# Patient Record
Sex: Female | Born: 1970
Health system: Southern US, Community
[De-identification: ages and names within clinical notes are randomized; demographics above are authoritative.]

## PROBLEM LIST (undated history)

## (undated) DIAGNOSIS — E079 Disorder of thyroid, unspecified: Secondary | ICD-10-CM

## (undated) DIAGNOSIS — R600 Localized edema: Secondary | ICD-10-CM

## (undated) DIAGNOSIS — M2550 Pain in unspecified joint: Secondary | ICD-10-CM

## (undated) DIAGNOSIS — K219 Gastro-esophageal reflux disease without esophagitis: Secondary | ICD-10-CM

## (undated) DIAGNOSIS — R7303 Prediabetes: Secondary | ICD-10-CM

## (undated) DIAGNOSIS — T7840XA Allergy, unspecified, initial encounter: Secondary | ICD-10-CM

## (undated) DIAGNOSIS — E785 Hyperlipidemia, unspecified: Secondary | ICD-10-CM

## (undated) DIAGNOSIS — N9489 Other specified conditions associated with female genital organs and menstrual cycle: Secondary | ICD-10-CM

## (undated) DIAGNOSIS — R112 Nausea with vomiting, unspecified: Secondary | ICD-10-CM

## (undated) DIAGNOSIS — L709 Acne, unspecified: Secondary | ICD-10-CM

## (undated) DIAGNOSIS — M7989 Other specified soft tissue disorders: Secondary | ICD-10-CM

## (undated) DIAGNOSIS — E7800 Pure hypercholesterolemia, unspecified: Secondary | ICD-10-CM

## (undated) DIAGNOSIS — N393 Stress incontinence (female) (male): Secondary | ICD-10-CM

## (undated) DIAGNOSIS — Z9889 Other specified postprocedural states: Secondary | ICD-10-CM

## (undated) DIAGNOSIS — E739 Lactose intolerance, unspecified: Secondary | ICD-10-CM

## (undated) DIAGNOSIS — E039 Hypothyroidism, unspecified: Secondary | ICD-10-CM

## (undated) HISTORY — DX: Disorder of thyroid, unspecified: E07.9

## (undated) HISTORY — DX: Hyperlipidemia, unspecified: E78.5

## (undated) HISTORY — DX: Acne, unspecified: L70.9

## (undated) HISTORY — DX: Localized edema: R60.0

## (undated) HISTORY — DX: Other specified soft tissue disorders: M79.89

## (undated) HISTORY — DX: Pure hypercholesterolemia, unspecified: E78.00

## (undated) HISTORY — DX: Gastro-esophageal reflux disease without esophagitis: K21.9

## (undated) HISTORY — DX: Pain in unspecified joint: M25.50

## (undated) HISTORY — PX: ABDOMINAL HYSTERECTOMY: SHX81

## (undated) HISTORY — DX: Allergy, unspecified, initial encounter: T78.40XA

## (undated) HISTORY — DX: Lactose intolerance, unspecified: E73.9

---
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
## 2006-08-11 DIAGNOSIS — D259 Leiomyoma of uterus, unspecified: Secondary | ICD-10-CM

## 2006-08-11 HISTORY — PX: LAPAROSCOPY: SHX197

## 2006-08-11 HISTORY — DX: Leiomyoma of uterus, unspecified: D25.9

## 2016-12-24 ENCOUNTER — Other Ambulatory Visit: Payer: Self-pay | Admitting: Obstetrics & Gynecology

## 2016-12-24 ENCOUNTER — Other Ambulatory Visit (HOSPITAL_COMMUNITY)
Admission: RE | Admit: 2016-12-24 | Discharge: 2016-12-24 | Disposition: A | Discharge status: Home / Self Care | Payer: 59 | Source: Ambulatory Visit | Attending: Obstetrics & Gynecology | Admitting: Obstetrics & Gynecology

## 2016-12-24 DIAGNOSIS — Z124 Encounter for screening for malignant neoplasm of cervix: Secondary | ICD-10-CM | POA: Insufficient documentation

## 2016-12-29 LAB — CYTOLOGY - PAP
DIAGNOSIS: NEGATIVE
HPV: NOT DETECTED

## 2017-03-31 DIAGNOSIS — M26629 Arthralgia of temporomandibular joint, unspecified side: Secondary | ICD-10-CM | POA: Insufficient documentation

## 2017-03-31 DIAGNOSIS — M26622 Arthralgia of left temporomandibular joint: Secondary | ICD-10-CM | POA: Diagnosis not present

## 2017-03-31 DIAGNOSIS — H9202 Otalgia, left ear: Secondary | ICD-10-CM | POA: Diagnosis not present

## 2017-04-20 DIAGNOSIS — M9901 Segmental and somatic dysfunction of cervical region: Secondary | ICD-10-CM | POA: Diagnosis not present

## 2017-04-20 DIAGNOSIS — M5032 Other cervical disc degeneration, mid-cervical region, unspecified level: Secondary | ICD-10-CM | POA: Diagnosis not present

## 2017-04-20 DIAGNOSIS — M531 Cervicobrachial syndrome: Secondary | ICD-10-CM | POA: Diagnosis not present

## 2017-04-22 DIAGNOSIS — M9901 Segmental and somatic dysfunction of cervical region: Secondary | ICD-10-CM | POA: Diagnosis not present

## 2017-04-22 DIAGNOSIS — M5032 Other cervical disc degeneration, mid-cervical region, unspecified level: Secondary | ICD-10-CM | POA: Diagnosis not present

## 2017-04-22 DIAGNOSIS — M531 Cervicobrachial syndrome: Secondary | ICD-10-CM | POA: Diagnosis not present

## 2017-04-23 DIAGNOSIS — M531 Cervicobrachial syndrome: Secondary | ICD-10-CM | POA: Diagnosis not present

## 2017-04-23 DIAGNOSIS — M5032 Other cervical disc degeneration, mid-cervical region, unspecified level: Secondary | ICD-10-CM | POA: Diagnosis not present

## 2017-04-23 DIAGNOSIS — M9901 Segmental and somatic dysfunction of cervical region: Secondary | ICD-10-CM | POA: Diagnosis not present

## 2017-04-24 DIAGNOSIS — M531 Cervicobrachial syndrome: Secondary | ICD-10-CM | POA: Diagnosis not present

## 2017-04-24 DIAGNOSIS — M5032 Other cervical disc degeneration, mid-cervical region, unspecified level: Secondary | ICD-10-CM | POA: Diagnosis not present

## 2017-04-24 DIAGNOSIS — M9901 Segmental and somatic dysfunction of cervical region: Secondary | ICD-10-CM | POA: Diagnosis not present

## 2017-04-29 DIAGNOSIS — M5032 Other cervical disc degeneration, mid-cervical region, unspecified level: Secondary | ICD-10-CM | POA: Diagnosis not present

## 2017-04-29 DIAGNOSIS — M9901 Segmental and somatic dysfunction of cervical region: Secondary | ICD-10-CM | POA: Diagnosis not present

## 2017-04-29 DIAGNOSIS — M531 Cervicobrachial syndrome: Secondary | ICD-10-CM | POA: Diagnosis not present

## 2017-05-01 DIAGNOSIS — M531 Cervicobrachial syndrome: Secondary | ICD-10-CM | POA: Diagnosis not present

## 2017-05-01 DIAGNOSIS — M9901 Segmental and somatic dysfunction of cervical region: Secondary | ICD-10-CM | POA: Diagnosis not present

## 2017-05-01 DIAGNOSIS — M5032 Other cervical disc degeneration, mid-cervical region, unspecified level: Secondary | ICD-10-CM | POA: Diagnosis not present

## 2017-05-04 DIAGNOSIS — M531 Cervicobrachial syndrome: Secondary | ICD-10-CM | POA: Diagnosis not present

## 2017-05-04 DIAGNOSIS — M5032 Other cervical disc degeneration, mid-cervical region, unspecified level: Secondary | ICD-10-CM | POA: Diagnosis not present

## 2017-05-04 DIAGNOSIS — M9901 Segmental and somatic dysfunction of cervical region: Secondary | ICD-10-CM | POA: Diagnosis not present

## 2017-05-07 DIAGNOSIS — M9901 Segmental and somatic dysfunction of cervical region: Secondary | ICD-10-CM | POA: Diagnosis not present

## 2017-05-07 DIAGNOSIS — M5032 Other cervical disc degeneration, mid-cervical region, unspecified level: Secondary | ICD-10-CM | POA: Diagnosis not present

## 2017-05-07 DIAGNOSIS — M531 Cervicobrachial syndrome: Secondary | ICD-10-CM | POA: Diagnosis not present

## 2017-05-13 DIAGNOSIS — M531 Cervicobrachial syndrome: Secondary | ICD-10-CM | POA: Diagnosis not present

## 2017-05-13 DIAGNOSIS — M9901 Segmental and somatic dysfunction of cervical region: Secondary | ICD-10-CM | POA: Diagnosis not present

## 2017-05-13 DIAGNOSIS — M5032 Other cervical disc degeneration, mid-cervical region, unspecified level: Secondary | ICD-10-CM | POA: Diagnosis not present

## 2017-05-14 DIAGNOSIS — M531 Cervicobrachial syndrome: Secondary | ICD-10-CM | POA: Diagnosis not present

## 2017-05-14 DIAGNOSIS — M5032 Other cervical disc degeneration, mid-cervical region, unspecified level: Secondary | ICD-10-CM | POA: Diagnosis not present

## 2017-05-14 DIAGNOSIS — M9901 Segmental and somatic dysfunction of cervical region: Secondary | ICD-10-CM | POA: Diagnosis not present

## 2017-09-09 DIAGNOSIS — N809 Endometriosis, unspecified: Secondary | ICD-10-CM | POA: Diagnosis not present

## 2017-09-09 DIAGNOSIS — N83202 Unspecified ovarian cyst, left side: Secondary | ICD-10-CM | POA: Diagnosis not present

## 2017-11-09 DIAGNOSIS — E039 Hypothyroidism, unspecified: Secondary | ICD-10-CM | POA: Diagnosis not present

## 2017-12-10 DIAGNOSIS — Z0000 Encounter for general adult medical examination without abnormal findings: Secondary | ICD-10-CM | POA: Diagnosis not present

## 2017-12-10 DIAGNOSIS — Z23 Encounter for immunization: Secondary | ICD-10-CM | POA: Diagnosis not present

## 2017-12-10 DIAGNOSIS — L700 Acne vulgaris: Secondary | ICD-10-CM | POA: Diagnosis not present

## 2017-12-10 DIAGNOSIS — E039 Hypothyroidism, unspecified: Secondary | ICD-10-CM | POA: Diagnosis not present

## 2017-12-25 ENCOUNTER — Other Ambulatory Visit: Payer: Self-pay | Admitting: Obstetrics & Gynecology

## 2017-12-25 DIAGNOSIS — N83202 Unspecified ovarian cyst, left side: Secondary | ICD-10-CM | POA: Diagnosis not present

## 2017-12-25 DIAGNOSIS — Z1231 Encounter for screening mammogram for malignant neoplasm of breast: Secondary | ICD-10-CM

## 2017-12-25 DIAGNOSIS — N941 Unspecified dyspareunia: Secondary | ICD-10-CM | POA: Diagnosis not present

## 2017-12-25 DIAGNOSIS — R300 Dysuria: Secondary | ICD-10-CM | POA: Diagnosis not present

## 2018-01-20 ENCOUNTER — Ambulatory Visit
Admission: RE | Admit: 2018-01-20 | Discharge: 2018-01-20 | Disposition: A | Discharge status: Home / Self Care | Payer: 59 | Source: Ambulatory Visit | Attending: Obstetrics & Gynecology | Admitting: Obstetrics & Gynecology

## 2018-01-20 DIAGNOSIS — Z1231 Encounter for screening mammogram for malignant neoplasm of breast: Secondary | ICD-10-CM

## 2018-03-10 DIAGNOSIS — R1031 Right lower quadrant pain: Secondary | ICD-10-CM | POA: Diagnosis not present

## 2018-03-10 DIAGNOSIS — R1033 Periumbilical pain: Secondary | ICD-10-CM | POA: Diagnosis not present

## 2018-03-16 ENCOUNTER — Other Ambulatory Visit: Payer: Self-pay | Admitting: Family Medicine

## 2018-03-16 DIAGNOSIS — R1033 Periumbilical pain: Secondary | ICD-10-CM

## 2018-03-16 DIAGNOSIS — R1031 Right lower quadrant pain: Secondary | ICD-10-CM

## 2018-03-24 ENCOUNTER — Ambulatory Visit
Admission: RE | Admit: 2018-03-24 | Discharge: 2018-03-24 | Disposition: A | Discharge status: Home / Self Care | Payer: 59 | Source: Ambulatory Visit | Attending: Family Medicine | Admitting: Family Medicine

## 2018-03-24 DIAGNOSIS — D252 Subserosal leiomyoma of uterus: Secondary | ICD-10-CM | POA: Diagnosis not present

## 2018-03-24 DIAGNOSIS — R1031 Right lower quadrant pain: Secondary | ICD-10-CM

## 2018-03-24 DIAGNOSIS — R1033 Periumbilical pain: Secondary | ICD-10-CM

## 2018-03-24 MED ORDER — IOPAMIDOL (ISOVUE-300) INJECTION 61%
100.0000 mL | Freq: Once | INTRAVENOUS | Status: AC | PRN
  Administered 2018-03-24: 100 mL via INTRAVENOUS

## 2018-05-08 DIAGNOSIS — M9901 Segmental and somatic dysfunction of cervical region: Secondary | ICD-10-CM | POA: Diagnosis not present

## 2018-05-08 DIAGNOSIS — M531 Cervicobrachial syndrome: Secondary | ICD-10-CM | POA: Diagnosis not present

## 2018-05-08 DIAGNOSIS — M5032 Other cervical disc degeneration, mid-cervical region, unspecified level: Secondary | ICD-10-CM | POA: Diagnosis not present

## 2018-05-10 DIAGNOSIS — M9901 Segmental and somatic dysfunction of cervical region: Secondary | ICD-10-CM | POA: Diagnosis not present

## 2018-05-10 DIAGNOSIS — M531 Cervicobrachial syndrome: Secondary | ICD-10-CM | POA: Diagnosis not present

## 2018-05-10 DIAGNOSIS — M9903 Segmental and somatic dysfunction of lumbar region: Secondary | ICD-10-CM | POA: Diagnosis not present

## 2018-05-13 DIAGNOSIS — M531 Cervicobrachial syndrome: Secondary | ICD-10-CM | POA: Diagnosis not present

## 2018-05-13 DIAGNOSIS — M9901 Segmental and somatic dysfunction of cervical region: Secondary | ICD-10-CM | POA: Diagnosis not present

## 2018-05-13 DIAGNOSIS — M9903 Segmental and somatic dysfunction of lumbar region: Secondary | ICD-10-CM | POA: Diagnosis not present

## 2018-05-19 DIAGNOSIS — M9901 Segmental and somatic dysfunction of cervical region: Secondary | ICD-10-CM | POA: Diagnosis not present

## 2018-05-19 DIAGNOSIS — M531 Cervicobrachial syndrome: Secondary | ICD-10-CM | POA: Diagnosis not present

## 2018-05-19 DIAGNOSIS — M9903 Segmental and somatic dysfunction of lumbar region: Secondary | ICD-10-CM | POA: Diagnosis not present

## 2018-05-21 DIAGNOSIS — Z111 Encounter for screening for respiratory tuberculosis: Secondary | ICD-10-CM | POA: Diagnosis not present

## 2020-03-02 ENCOUNTER — Other Ambulatory Visit: Payer: Self-pay | Admitting: Obstetrics & Gynecology

## 2020-03-02 DIAGNOSIS — Z1231 Encounter for screening mammogram for malignant neoplasm of breast: Secondary | ICD-10-CM

## 2020-03-14 ENCOUNTER — Other Ambulatory Visit: Payer: Self-pay

## 2020-03-14 ENCOUNTER — Ambulatory Visit
Admission: RE | Admit: 2020-03-14 | Discharge: 2020-03-14 | Disposition: A | Discharge status: Home / Self Care | Payer: 59 | Source: Ambulatory Visit | Attending: Obstetrics & Gynecology | Admitting: Obstetrics & Gynecology

## 2020-03-14 DIAGNOSIS — Z1231 Encounter for screening mammogram for malignant neoplasm of breast: Secondary | ICD-10-CM

## 2020-08-29 ENCOUNTER — Telehealth: Payer: Self-pay | Admitting: *Deleted

## 2020-08-29 NOTE — Telephone Encounter (Signed)
Called the patient for a new patient;  Scheduled for new patient for 1/21 at 3:00 pm for Dr Berline Lopes

## 2020-08-30 ENCOUNTER — Encounter: Payer: Self-pay | Admitting: Gynecologic Oncology

## 2020-08-31 ENCOUNTER — Encounter: Payer: Self-pay | Admitting: Gynecologic Oncology

## 2020-08-31 ENCOUNTER — Inpatient Hospital Stay: Payer: BC Managed Care – PPO | Attending: Gynecologic Oncology | Admitting: Gynecologic Oncology

## 2020-08-31 ENCOUNTER — Other Ambulatory Visit: Payer: Self-pay

## 2020-08-31 VITALS — BP 131/83 | HR 67 | Temp 96.8°F | Resp 18 | Ht 61.0 in | Wt 183.0 lb

## 2020-08-31 DIAGNOSIS — N83202 Unspecified ovarian cyst, left side: Secondary | ICD-10-CM | POA: Insufficient documentation

## 2020-08-31 DIAGNOSIS — R971 Elevated cancer antigen 125 [CA 125]: Secondary | ICD-10-CM | POA: Diagnosis not present

## 2020-08-31 DIAGNOSIS — E039 Hypothyroidism, unspecified: Secondary | ICD-10-CM | POA: Insufficient documentation

## 2020-08-31 DIAGNOSIS — E7800 Pure hypercholesterolemia, unspecified: Secondary | ICD-10-CM | POA: Diagnosis not present

## 2020-08-31 MED ORDER — SENNOSIDES-DOCUSATE SODIUM 8.6-50 MG PO TABS: 2.0000 | ORAL_TABLET | Freq: Every day | ORAL | 0 refills | Status: DC

## 2020-08-31 MED ORDER — IBUPROFEN 800 MG PO TABS: 800.0000 mg | ORAL_TABLET | Freq: Three times a day (TID) | ORAL | 0 refills | Status: DC | PRN

## 2020-08-31 MED ORDER — TRAMADOL HCL 50 MG PO TABS: 50.0000 mg | ORAL_TABLET | Freq: Four times a day (QID) | ORAL | 0 refills | Status: DC | PRN

## 2020-08-31 NOTE — H&P (View-Only) (Signed)
GYNECOLOGIC ONCOLOGY NEW PATIENT CONSULTATION   Patient Name: Betty Porter  Patient Age: 50 y.o. Date of Service: 08/31/20 Referring Provider: Dr. Christophe Louis  Primary Care Provider: Via, Lennette Bihari, MD Consulting Provider: Jeral Pinch, MD   Assessment/Plan:  Premenopausal patient with a mildly elevated Ca1 25 and complex adnexal mass concerning for endometrioma.  Discussed ultrasound findings with the patient with a mostly cystic adnexal mass.  In the setting of her history (laparoscopic surgery for infertility, dyspareunia) as well as description on ultrasound report of the cystic lesion, I am suspicious that it represents an endometrioma.  She denies being told previously that she had endometriosis.  We discussed her recent tumor markers.  Her HE4 and Roma were both within normal limits.  Her CA 125 was mildly elevated.  We discussed the limitations of this test and that it is not very sensitive or specific.  I reviewed that it can be elevated in many noncancerous processes, such as endometriosis and uterine fibroids.  Additionally, it can be normal and up to 30-50% of early stage ovarian cancer.  We discussed the limitations of imaging and tumor testing and that the only way to make a definitive diagnosis is with surgical excision.  My suspicion that this cyst represents a malignancy is quite low.  Given our current COVID surge, I discussed with the patient that we could move forward with planning for outpatient surgery with a unilateral oophorectomy and bilateral salpingectomy.  I discussed the risk that her surgery may need to be postponed given staffing shortages as well as hospital COVID numbers.  Tentatively, we will plan to move forward with surgery at the outpatient surgical center on February 2.  My recommendation was to proceed with scheduling robotic unilateral oophorectomy and bilateral salpingectomy.  Plan will be for excision of the enlarged ovary and cystic lesion.  I will then  place it in a bag and perform contained drainage.  We will then plan to send the adnexa to pathology for frozen section.  If no malignancy seen, then no further surgery is indicated.  In the event of a borderline tumor, I would recommend proceeding with completion surgery to involve contralateral salpingo-oophorectomy and total hysterectomy.  Only in the setting of malignancy, with the patient require staging including lymph node sampling, omentectomy, peritoneal biopsies, and any other indicated procedure.  She is amenable and would like to proceed with the least amount of surgery required.  We will plan for a robotic assisted unilateral salpingo-oophorectomy, contralateral salpingectomy, possible mini-lap, possible total hysterectomy, possible staging. The risks of surgery were discussed in detail and she understands these to include infection; wound separation; hernia; vaginal cuff separation, injury to adjacent organs such as bowel, bladder, blood vessels, ureters and nerves; bleeding which may require blood transfusion; anesthesia risk; thromboembolic events; possible death; unforeseen complications; possible need for re-exploration; medical complications such as heart attack, stroke, pleural effusion and pneumonia; and, if full lymphadenectomy is performed the risk of lymphedema and lymphocyst. The patient will receive DVT and antibiotic prophylaxis as indicated. She voiced a clear understanding. She had the opportunity to ask questions. Perioperative instructions were reviewed with her. Prescriptions for post-op medications were sent to her pharmacy of choice.  We briefly discussed her recent Pap test and recommendation for repeat cotesting in 3 years (given HPV negative).  A copy of this note was sent to the patient's referring provider.   65 minutes of total time was spent for this patient encounter, including preparation, face-to-face counseling with  the patient and coordination of care, and  documentation of the encounter.   Jeral Pinch, MD  Division of Gynecologic Oncology  Department of Obstetrics and Gynecology  [facility]  ___________________________________________  Chief Complaint: Chief Complaint  Patient presents with  . Cyst of left ovary    History of Present Illness:  Betty Porter is a 50 y.o. y.o. female who is seen in consultation at the request of Dr. Landry Mellow or an evaluation of a complex adnexal mass.  Patient has been followed for a complex left adnexal mass since approximately 2018.  At that time, she had an ultrasound showing a 5.5 x 5.1 x 4.9 cm complex left cystic mass with low-level internal echoes.  It was thought to be likely an endometrioma given its size and avascularity.  Repeat ultrasound in early 2019 showed the left complex cystic mass stable in size at 5.5 cm.  More recently, an ultrasound in September 2021 showed increase in size of the left adnexal mass to 9.4 cm, still avascular.  Most recent ultrasound on 1/5 showed the left ovary with a complex cyst measuring 10 x 7 x 6.9 cm, continues to be avascular.  All ultrasounds continue to show a fibroid uterus without significant growth.  Given recent growth in the mass, a CA125 was obtained and was elevated at 152.  HE4 was normal (51.9), and premenopausal Roma also normal (0.92).  Patient notes overall being asymptomatic.  This morning she had very minor "burning" in her left lower pelvis.  Otherwise she denies pain or cramping.  She endorses a good appetite without nausea or emesis.  She reports normal bowel function.  She is been seen for mixed urinary incontinence, denies any change to urinary function recently.  She continues to have regular monthly menses.  Her surgical history is notable for a C-section for arrest of labor.  She also had diagnostic laparoscopy for infertility work-up.  She is unsure whether there were findings of endometriosis.  She denies any  pain with bowel movements but notes a very long history of dyspareunia.  Patient lives in [place] with her husband and son.  She works as a Optometrist.  She has a remote history of tobacco use.  PAST MEDICAL HISTORY:  Past Medical History:  Diagnosis Date  . Fibroid, uterine 2008  . High cholesterol   . Thyroid disease    Hypothroidism     PAST SURGICAL HISTORY:  Past Surgical History:  Procedure Laterality Date  . CESAREAN SECTION  2009  . LAPAROSCOPY     for infertility  . TONSILLECTOMY  1977    OB/GYN HISTORY:  OB History  Gravida Para Term Preterm AB Living  1 1       1   SAB IAB Ectopic Multiple Live Births               # Outcome Date GA Lbr Len/2nd Weight Sex Delivery Anes PTL Lv  1 Para             No LMP recorded.  Age at menarche: 33  Age at menopause: n/a Hx of HRT: n/a Hx of STDs: no Last pap: 01/2017, negative, HPV negative; 02/2020 - had ASCUS pap, HPV negative History of abnormal pap smears: yes, most recent  SCREENING STUDIES:  Last mammogram: 03/2020  Last colonoscopy: n/a  MEDICATIONS: Outpatient Encounter Medications as of 08/31/2020  Medication Sig  . diclofenac Sodium (VOLTAREN) 1 % GEL Apply topically. Use as directed  .  gemfibrozil (LOPID) 600 MG tablet 1 tablet by mouth twice a day  . ibuprofen (ADVIL) 800 MG tablet Take 1 tablet (800 mg total) by mouth every 8 (eight) hours as needed for moderate pain. For AFTER surgery only  . levothyroxine (SYNTHROID) 75 MCG tablet 1 tablet on an empty stomach in the morning  . Multiple Vitamins-Minerals (WOMENS MULTI) CAPS Take 1 capsule by mouth daily.  Marland Kitchen senna-docusate (SENOKOT-S) 8.6-50 MG tablet Take 2 tablets by mouth at bedtime. For AFTER surgery, do not take if having diarrhea  . traMADol (ULTRAM) 50 MG tablet Take 1 tablet (50 mg total) by mouth every 6 (six) hours as needed for severe pain. For AFTER surgery only, do not take and drive   No facility-administered encounter medications  on file as of 08/31/2020.    ALLERGIES:  No Known Allergies   FAMILY HISTORY:  Family History  Problem Relation Age of Onset  . Breast cancer Maternal Aunt   . Hypertension Mother   . Osteoporosis Mother   . Arthritis Mother   . Diabetes Father   . Thyroid cancer Maternal Aunt   . Leukemia Maternal Uncle   . Colon cancer Neg Hx   . Ovarian cancer Neg Hx   . Endometrial cancer Neg Hx   . Pancreatic cancer Neg Hx   . Prostate cancer Neg Hx      SOCIAL HISTORY:  Social Connections: Not on file    REVIEW OF SYSTEMS:  + joint pain Denies appetite changes, fevers, chills, fatigue, unexplained weight changes. Denies hearing loss, neck lumps or masses, mouth sores, ringing in ears or voice changes. Denies cough or wheezing.  Denies shortness of breath. Denies chest pain or palpitations. Denies leg swelling. Denies abdominal distention, pain, blood in stools, constipation, diarrhea, nausea, vomiting, or early satiety. Denies pain with intercourse, dysuria, frequency, hematuria or incontinence. Denies hot flashes, pelvic pain, vaginal bleeding or vaginal discharge.   Denies back pain or muscle pain/cramps. Denies itching, rash, or wounds. Denies dizziness, headaches, numbness or seizures. Denies swollen lymph nodes or glands, denies easy bruising or bleeding. Denies anxiety, depression, confusion, or decreased concentration.  Physical Exam:  Vital Signs for this encounter:  Blood pressure 131/83, pulse 67, temperature (!) 96.8 F (36 C), temperature source Tympanic, resp. rate 18, height 5\' 1"  (1.549 m), weight 183 lb (83 kg), SpO2 100 %. Body mass index is 34.58 kg/m. General: Alert, oriented, no acute distress.  HEENT: Normocephalic, atraumatic. Sclera anicteric.  Chest: Clear to auscultation bilaterally. No wheezes, rhonchi, or rales. Cardiovascular: Regular rate and rhythm, no murmurs, rubs, or gallops.  Abdomen: Obese. Normoactive bowel sounds. Soft, nondistended,  nontender to palpation. No masses or hepatosplenomegaly appreciated. No palpable fluid wave.  Extremities: Grossly normal range of motion. Warm, well perfused. No edema bilaterally.  Skin: No rashes or lesions.  Lymphatics: No cervical, supraclavicular, or inguinal adenopathy.  GU:  Normal external female genitalia.   No lesions. No discharge or bleeding.             Bladder/urethra:  No lesions or masses, well supported bladder             Vagina: well rugated, no lesions.             Cervix: Normal appearing, no lesions.             Uterus: 8-10cm, mobile, no parametrial involvement or nodularity.              Adnexa: Midline smooth mass  that moves in junction with uterus, sitting out of the pelvis.  Rectal: Deferred.  LABORATORY AND RADIOLOGIC DATA:  Outside medical records were reviewed to synthesize the above history, along with the history and physical obtained during the visit.   No results found for: WBC, HGB, HCT, PLT, GLUCOSE, CHOL, TRIG, HDL, LDLDIRECT, LDLCALC, ALT, AST, NA, K, CL, CREATININE, BUN, CO2, TSH, PSA, INR, GLUF, HGBA1C, MICROALBUR   Most recent pelvic ultrasound from [facility] on 08/15/2020: Uterus measures 10.25 x 6.2 x 7.2 cm.  3 fibroids noted the largest of which measured 4 cm.  Right ovary normal in appearance.  Left ovary with a complex cyst with low-level internal echoes measuring 10 x 7 x 6.9 cm, avascular.  CT A/P 03/2018: 1. No acute findings within the abdomen or pelvis. No evidence for hernia. 2. Left ovary cysts. The largest measures 5.8 cm. This is almost certainly benign, but follow up ultrasound is recommended in 1 year according to the Society of Radiologists in [place] Statement (D Clovis Riley et al. Management of Asymptomatic Ovarian and Other Adnexal Cysts Imaged at [place]: Society of Radiologists in [place] Statement 2010. Radiology 256 (Sept 2010): 943-954.). 3. 4.7 cm calcified subserosal fibroid  arises from the left lateral wall of the uterus.

## 2020-08-31 NOTE — Progress Notes (Signed)
GYNECOLOGIC ONCOLOGY NEW PATIENT CONSULTATION   Patient Name: Betty Porter  Patient Age: 50 y.o. Date of Service: 08/31/20 Referring Provider: Dr. Christophe Louis  Primary Care Provider: Via, Lennette Bihari, MD Consulting Provider: Jeral Pinch, MD   Assessment/Plan:  Premenopausal patient with a mildly elevated Ca1 25 and complex adnexal mass concerning for endometrioma.  Discussed ultrasound findings with the patient with a mostly cystic adnexal mass.  In the setting of her history (laparoscopic surgery for infertility, dyspareunia) as well as description on ultrasound report of the cystic lesion, I am suspicious that it represents an endometrioma.  She denies being told previously that she had endometriosis.  We discussed her recent tumor markers.  Her HE4 and Roma were both within normal limits.  Her CA 125 was mildly elevated.  We discussed the limitations of this test and that it is not very sensitive or specific.  I reviewed that it can be elevated in many noncancerous processes, such as endometriosis and uterine fibroids.  Additionally, it can be normal and up to 30-50% of early stage ovarian cancer.  We discussed the limitations of imaging and tumor testing and that the only way to make a definitive diagnosis is with surgical excision.  My suspicion that this cyst represents a malignancy is quite low.  Given our current COVID surge, I discussed with the patient that we could move forward with planning for outpatient surgery with a unilateral oophorectomy and bilateral salpingectomy.  I discussed the risk that her surgery may need to be postponed given staffing shortages as well as hospital COVID numbers.  Tentatively, we will plan to move forward with surgery at the outpatient surgical center on February 2.  My recommendation was to proceed with scheduling robotic unilateral oophorectomy and bilateral salpingectomy.  Plan will be for excision of the enlarged ovary and cystic lesion.  I will then  place it in a bag and perform contained drainage.  We will then plan to send the adnexa to pathology for frozen section.  If no malignancy seen, then no further surgery is indicated.  In the event of a borderline tumor, I would recommend proceeding with completion surgery to involve contralateral salpingo-oophorectomy and total hysterectomy.  Only in the setting of malignancy, with the patient require staging including lymph node sampling, omentectomy, peritoneal biopsies, and any other indicated procedure.  She is amenable and would like to proceed with the least amount of surgery required.  We will plan for a robotic assisted unilateral salpingo-oophorectomy, contralateral salpingectomy, possible mini-lap, possible total hysterectomy, possible staging. The risks of surgery were discussed in detail and she understands these to include infection; wound separation; hernia; vaginal cuff separation, injury to adjacent organs such as bowel, bladder, blood vessels, ureters and nerves; bleeding which may require blood transfusion; anesthesia risk; thromboembolic events; possible death; unforeseen complications; possible need for re-exploration; medical complications such as heart attack, stroke, pleural effusion and pneumonia; and, if full lymphadenectomy is performed the risk of lymphedema and lymphocyst. The patient will receive DVT and antibiotic prophylaxis as indicated. She voiced a clear understanding. She had the opportunity to ask questions. Perioperative instructions were reviewed with her. Prescriptions for post-op medications were sent to her pharmacy of choice.  We briefly discussed her recent Pap test and recommendation for repeat cotesting in 3 years (given HPV negative).  A copy of this note was sent to the patient's referring provider.   48 minutes of total time was spent for this patient encounter, including preparation, face-to-face counseling with  the patient and coordination of care, and  documentation of the encounter.   Jeral Pinch, MD  Division of Gynecologic Oncology  Department of Obstetrics and Gynecology  [facility]  ___________________________________________  Chief Complaint: Chief Complaint  Patient presents with  . Cyst of left ovary    History of Present Illness:  Betty Porter is a 50 y.o. y.o. female who is seen in consultation at the request of Dr. Landry Mellow or an evaluation of a complex adnexal mass.  Patient has been followed for a complex left adnexal mass since approximately 2018.  At that time, she had an ultrasound showing a 5.5 x 5.1 x 4.9 cm complex left cystic mass with low-level internal echoes.  It was thought to be likely an endometrioma given its size and avascularity.  Repeat ultrasound in early 2019 showed the left complex cystic mass stable in size at 5.5 cm.  More recently, an ultrasound in September 2021 showed increase in size of the left adnexal mass to 9.4 cm, still avascular.  Most recent ultrasound on 1/5 showed the left ovary with a complex cyst measuring 10 x 7 x 6.9 cm, continues to be avascular.  All ultrasounds continue to show a fibroid uterus without significant growth.  Given recent growth in the mass, a CA125 was obtained and was elevated at 152.  HE4 was normal (51.9), and premenopausal Roma also normal (0.92).  Patient notes overall being asymptomatic.  This morning she had very minor "burning" in her left lower pelvis.  Otherwise she denies pain or cramping.  She endorses a good appetite without nausea or emesis.  She reports normal bowel function.  She is been seen for mixed urinary incontinence, denies any change to urinary function recently.  She continues to have regular monthly menses.  Her surgical history is notable for a C-section for arrest of labor.  She also had diagnostic laparoscopy for infertility work-up.  She is unsure whether there were findings of endometriosis.  She denies any  pain with bowel movements but notes a very long history of dyspareunia.  Patient lives in [place] with her husband and son.  She works as a Optometrist.  She has a remote history of tobacco use.  PAST MEDICAL HISTORY:  Past Medical History:  Diagnosis Date  . Fibroid, uterine 2008  . High cholesterol   . Thyroid disease    Hypothroidism     PAST SURGICAL HISTORY:  Past Surgical History:  Procedure Laterality Date  . CESAREAN SECTION  2009  . LAPAROSCOPY     for infertility  . TONSILLECTOMY  1977    OB/GYN HISTORY:  OB History  Gravida Para Term Preterm AB Living  1 1       1   SAB IAB Ectopic Multiple Live Births               # Outcome Date GA Lbr Len/2nd Weight Sex Delivery Anes PTL Lv  1 Para             No LMP recorded.  Age at menarche: 66  Age at menopause: n/a Hx of HRT: n/a Hx of STDs: no Last pap: 01/2017, negative, HPV negative; 02/2020 - had ASCUS pap, HPV negative History of abnormal pap smears: yes, most recent  SCREENING STUDIES:  Last mammogram: 03/2020  Last colonoscopy: n/a  MEDICATIONS: Outpatient Encounter Medications as of 08/31/2020  Medication Sig  . diclofenac Sodium (VOLTAREN) 1 % GEL Apply topically. Use as directed  .  gemfibrozil (LOPID) 600 MG tablet 1 tablet by mouth twice a day  . ibuprofen (ADVIL) 800 MG tablet Take 1 tablet (800 mg total) by mouth every 8 (eight) hours as needed for moderate pain. For AFTER surgery only  . levothyroxine (SYNTHROID) 75 MCG tablet 1 tablet on an empty stomach in the morning  . Multiple Vitamins-Minerals (WOMENS MULTI) CAPS Take 1 capsule by mouth daily.  Marland Kitchen senna-docusate (SENOKOT-S) 8.6-50 MG tablet Take 2 tablets by mouth at bedtime. For AFTER surgery, do not take if having diarrhea  . traMADol (ULTRAM) 50 MG tablet Take 1 tablet (50 mg total) by mouth every 6 (six) hours as needed for severe pain. For AFTER surgery only, do not take and drive   No facility-administered encounter medications  on file as of 08/31/2020.    ALLERGIES:  No Known Allergies   FAMILY HISTORY:  Family History  Problem Relation Age of Onset  . Breast cancer Maternal Aunt   . Hypertension Mother   . Osteoporosis Mother   . Arthritis Mother   . Diabetes Father   . Thyroid cancer Maternal Aunt   . Leukemia Maternal Uncle   . Colon cancer Neg Hx   . Ovarian cancer Neg Hx   . Endometrial cancer Neg Hx   . Pancreatic cancer Neg Hx   . Prostate cancer Neg Hx      SOCIAL HISTORY:  Social Connections: Not on file    REVIEW OF SYSTEMS:  + joint pain Denies appetite changes, fevers, chills, fatigue, unexplained weight changes. Denies hearing loss, neck lumps or masses, mouth sores, ringing in ears or voice changes. Denies cough or wheezing.  Denies shortness of breath. Denies chest pain or palpitations. Denies leg swelling. Denies abdominal distention, pain, blood in stools, constipation, diarrhea, nausea, vomiting, or early satiety. Denies pain with intercourse, dysuria, frequency, hematuria or incontinence. Denies hot flashes, pelvic pain, vaginal bleeding or vaginal discharge.   Denies back pain or muscle pain/cramps. Denies itching, rash, or wounds. Denies dizziness, headaches, numbness or seizures. Denies swollen lymph nodes or glands, denies easy bruising or bleeding. Denies anxiety, depression, confusion, or decreased concentration.  Physical Exam:  Vital Signs for this encounter:  Blood pressure 131/83, pulse 67, temperature (!) 96.8 F (36 C), temperature source Tympanic, resp. rate 18, height 5\' 1"  (1.549 m), weight 183 lb (83 kg), SpO2 100 %. Body mass index is 34.58 kg/m. General: Alert, oriented, no acute distress.  HEENT: Normocephalic, atraumatic. Sclera anicteric.  Chest: Clear to auscultation bilaterally. No wheezes, rhonchi, or rales. Cardiovascular: Regular rate and rhythm, no murmurs, rubs, or gallops.  Abdomen: Obese. Normoactive bowel sounds. Soft, nondistended,  nontender to palpation. No masses or hepatosplenomegaly appreciated. No palpable fluid wave.  Extremities: Grossly normal range of motion. Warm, well perfused. No edema bilaterally.  Skin: No rashes or lesions.  Lymphatics: No cervical, supraclavicular, or inguinal adenopathy.  GU:  Normal external female genitalia.   No lesions. No discharge or bleeding.             Bladder/urethra:  No lesions or masses, well supported bladder             Vagina: well rugated, no lesions.             Cervix: Normal appearing, no lesions.             Uterus: 8-10cm, mobile, no parametrial involvement or nodularity.              Adnexa: Midline smooth mass  that moves in junction with uterus, sitting out of the pelvis.  Rectal: Deferred.  LABORATORY AND RADIOLOGIC DATA:  Outside medical records were reviewed to synthesize the above history, along with the history and physical obtained during the visit.   No results found for: WBC, HGB, HCT, PLT, GLUCOSE, CHOL, TRIG, HDL, LDLDIRECT, LDLCALC, ALT, AST, NA, K, CL, CREATININE, BUN, CO2, TSH, PSA, INR, GLUF, HGBA1C, MICROALBUR   Most recent pelvic ultrasound from [facility] on 08/15/2020: Uterus measures 10.25 x 6.2 x 7.2 cm.  3 fibroids noted the largest of which measured 4 cm.  Right ovary normal in appearance.  Left ovary with a complex cyst with low-level internal echoes measuring 10 x 7 x 6.9 cm, avascular.  CT A/P 03/2018: 1. No acute findings within the abdomen or pelvis. No evidence for hernia. 2. Left ovary cysts. The largest measures 5.8 cm. This is almost certainly benign, but follow up ultrasound is recommended in 1 year according to the Society of Radiologists in [place] Statement (D Clovis Riley et al. Management of Asymptomatic Ovarian and Other Adnexal Cysts Imaged at [place]: Society of Radiologists in [place] Statement 2010. Radiology 256 (Sept 2010): 943-954.). 3. 4.7 cm calcified subserosal fibroid  arises from the left lateral wall of the uterus.

## 2020-08-31 NOTE — Patient Instructions (Addendum)
Preparing for your Surgery  Plan for surgery on September 12, 2020 with Dr. Jeral Pinch at [facility]. You will be scheduled for a robotic assisted laparoscopic unilateral oophorectomy (removal of one ovary), bilateral salpingectomy (removal of both fallopian tubes), possible staging if a precancer or cancer is identified including removal of the other ovary, hysterectomy (removal of the uterus and cervix), possible mini laparotomy (larger incision on your abdomen if needed).   Pre-operative Testing -You will receive a phone call from presurgical testing at [facility] to arrange for a pre-operative appointment, labs if needed, and COVID test. The COVID test normally happens 3 days prior to the surgery and they ask that you self quarantine after the test up until surgery to decrease chance of exposure.  -Bring your insurance card, copy of an advanced directive if applicable, medication list  -At that visit, you will be asked to sign a consent for a possible blood transfusion in case a transfusion becomes necessary during surgery.  The need for a blood transfusion is rare but having consent is a necessary part of your care.     -You should not be taking blood thinners or aspirin at least ten days prior to surgery unless instructed by your surgeon.  -Do not take supplements such as fish oil (omega 3), red yeast rice, turmeric before your surgery.   Day Before Surgery at [place] will be asked to take in a light diet the day before surgery. You will be advised you can have clear liquids up until 3 hours before your surgery.    Eat a light diet the day before surgery.  Examples including soups, broths, toast, yogurt, mashed potatoes.  AVOID GAS PRODUCING FOODS. Things to avoid include carbonated beverages (fizzy beverages), raw fruits and raw vegetables, or beans.   If your bowels are filled with gas, your surgeon will have difficulty visualizing your pelvic organs  which increases your surgical risks.  Your role in recovery Your role is to become active as soon as directed by your doctor, while still giving yourself time to heal.  Rest when you feel tired. You will be asked to do the following in order to speed your recovery:  - Cough and breathe deeply. This helps to clear and expand your lungs and can prevent pneumonia after surgery.  - [place]. Do mild physical activity. Walking or moving your legs help your circulation and body functions return to normal. Do not try to get up or walk alone the first time after surgery.   -If you develop swelling on one leg or the other, pain in the back of your leg, redness/warmth in one of your legs, please call the office or go to the Emergency Room to have a doppler to rule out a blood clot. For shortness of breath, chest pain-seek care in the Emergency Room as soon as possible. - Actively manage your pain. Managing your pain lets you move in comfort. We will ask you to rate your pain on a scale of zero to 10. It is your responsibility to tell your doctor or nurse where and how much you hurt so your pain can be treated.  Special Considerations -If you are diabetic, you may be placed on insulin after surgery to have closer control over your blood sugars to promote healing and recovery.  This does not mean that you will be discharged on insulin.  If applicable, your oral antidiabetics will be resumed  when you are tolerating a solid diet.  -Your final pathology results from surgery should be available around one week after surgery and the results will be relayed to you when available.  -FMLA forms can be faxed to [phone] and please allow 5-7 business days for completion.  Pain Management After Surgery -You have been prescribed your pain medication and bowel regimen medications before surgery so that you can have these available when you are discharged from the hospital. The pain medication is  for use ONLY AFTER surgery and a new prescription will not be given.   -Make sure that you have Tylenol and Ibuprofen at home to use on a regular basis after surgery for pain control. We recommend alternating the medications every hour to six hours since they work differently and are processed in the body differently for pain relief.  -Review the attached handout on narcotic use and their risks and side effects.   Bowel Regimen -You have been prescribed Sennakot-S to take nightly to prevent constipation especially if you are taking the narcotic pain medication intermittently.  It is important to prevent constipation and drink adequate amounts of liquids. You can stop taking this medication when you are not taking pain medication and you are back on your normal bowel routine.  Risks of Surgery Risks of surgery are low but include bleeding, infection, damage to surrounding structures, re-operation, blood clots, and very rarely death.   Blood Transfusion Information (For the consent to be signed before surgery)  We will be checking your blood type before surgery so in case of emergencies, we will know what type of blood you would need.                                            WHAT IS A BLOOD TRANSFUSION?  A transfusion is the replacement of blood or some of its parts. Blood is made up of multiple cells which provide different functions.  Red blood cells carry oxygen and are used for blood loss replacement.  White blood cells fight against infection.  Platelets control bleeding.  Plasma helps clot blood.  Other blood products are available for specialized needs, such as hemophilia or other clotting disorders. BEFORE THE TRANSFUSION  Who gives blood for transfusions?   You may be able to donate blood to be used at a later date on yourself (autologous donation).  Relatives can be asked to donate blood. This is generally not any safer than if you have received blood from a stranger. The  same precautions are taken to ensure safety when a relative's blood is donated.  Healthy volunteers who are fully evaluated to make sure their blood is safe. This is blood bank blood. Transfusion therapy is the safest it has ever been in the practice of medicine. Before blood is taken from a donor, a complete history is taken to make sure that person has no history of diseases nor engages in risky social behavior (examples are intravenous drug use or sexual activity with multiple partners). The donor's travel history is screened to minimize risk of transmitting infections, such as malaria. The donated blood is tested for signs of infectious diseases, such as HIV and hepatitis. The blood is then tested to be sure it is compatible with you in order to minimize the chance of a transfusion reaction. If you or a relative donates blood, this is often  done in anticipation of surgery and is not appropriate for emergency situations. It takes many days to process the donated blood. RISKS AND COMPLICATIONS Although transfusion therapy is very safe and saves many lives, the main dangers of transfusion include:   Getting an infectious disease.  Developing a transfusion reaction. This is an allergic reaction to something in the blood you were given. Every precaution is taken to prevent this. The decision to have a blood transfusion has been considered carefully by your caregiver before blood is given. Blood is not given unless the benefits outweigh the risks.  AFTER SURGERY INSTRUCTIONS  Return to work: 4 weeks if applicable  Activity: 1. Be up and out of the bed during the day.  Take a nap if needed.  You may walk up steps but be careful and use the hand rail.  Stair climbing will tire you more than you think, you may need to stop part way and rest.   2. No lifting or straining for 6 weeks over 10 pounds. No pushing, pulling, straining for 6 weeks.  3. No driving for 1 week(s).  Do not drive if you are  taking narcotic pain medicine and make sure that your reaction time has returned.   4. You can shower as soon as the next day after surgery. Shower daily.  Use your regular soap and water (not directly on the incision) and pat your incision(s) dry afterwards; don't rub.  No tub baths or submerging your body in water until cleared by your surgeon. If you have the soap that was given to you by pre-surgical testing that was used before surgery, you do not need to use it afterwards because this can irritate your incisions.   5. No sexual activity and nothing in the vagina for 4 weeks (8 weeks if you have a hysterectomy).  6. You may experience a small amount of clear drainage from your incisions, which is normal.  If the drainage persists, increases, or changes color please call the office.  7. Do not use creams, lotions, or ointments such as neosporin on your incisions after surgery until advised by your surgeon because they can cause removal of the dermabond glue on your incisions.    8. You may experience vaginal spotting after surgery or around the 6-8 week mark from surgery when the stitches at the top of the vagina begin to dissolve (if you have a hysterectomy).  The spotting is normal but if you experience heavy bleeding, call our office.  9. Take Tylenol or ibuprofen first for pain and only use narcotic pain medication for severe pain not relieved by the Tylenol or Ibuprofen.  Monitor your Tylenol intake to a max of 4,000 mg in a 24 hour period. You can alternate these medications after surgery.  Diet: 1. Low sodium Heart Healthy Diet is recommended but you are cleared to resume your normal (before surgery) diet after your procedure.  2. It is safe to use a laxative, such as Miralax or Colace, if you have difficulty moving your bowels. You have been prescribed Sennakot at bedtime every evening to keep bowel movements regular and to prevent constipation.    Wound Care: 1. Keep clean and dry.   Shower daily.  Reasons to call the Doctor:  Fever - Oral temperature greater than 100.4 degrees Fahrenheit  Foul-smelling vaginal discharge  Difficulty urinating  Nausea and vomiting  Increased pain at the site of the incision that is unrelieved with pain medicine.  Difficulty breathing with  or without chest pain  New calf pain especially if only on one side  Sudden, continuing increased vaginal bleeding with or without clots.   Contacts: For questions or concerns you should contact:  Dr. Jeral Pinch at [phone]  Joylene John, NP at [phone]  After Hours: call [phone] and have the GYN Oncologist paged/contacted (after 5 pm or on the weekends).  Messages sent via mychart are for non-urgent matters and are not responded to after hours so for urgent needs, please call the after hours number.

## 2020-09-03 ENCOUNTER — Other Ambulatory Visit: Payer: Self-pay | Admitting: Gynecologic Oncology

## 2020-09-03 DIAGNOSIS — N83202 Unspecified ovarian cyst, left side: Secondary | ICD-10-CM

## 2020-09-08 ENCOUNTER — Other Ambulatory Visit (HOSPITAL_COMMUNITY): Payer: BC Managed Care – PPO

## 2020-09-10 ENCOUNTER — Encounter (HOSPITAL_COMMUNITY)
Admission: RE | Admit: 2020-09-10 | Discharge: 2020-09-10 | Disposition: A | Discharge status: Home / Self Care | Payer: BC Managed Care – PPO | Source: Ambulatory Visit | Attending: Gynecologic Oncology | Admitting: Gynecologic Oncology

## 2020-09-10 ENCOUNTER — Encounter (HOSPITAL_BASED_OUTPATIENT_CLINIC_OR_DEPARTMENT_OTHER): Payer: Self-pay | Admitting: Gynecologic Oncology

## 2020-09-10 ENCOUNTER — Other Ambulatory Visit: Payer: Self-pay

## 2020-09-10 ENCOUNTER — Other Ambulatory Visit (HOSPITAL_COMMUNITY)
Admission: RE | Admit: 2020-09-10 | Discharge: 2020-09-10 | Disposition: A | Discharge status: Home / Self Care | Payer: BC Managed Care – PPO | Source: Ambulatory Visit | Attending: Gynecologic Oncology | Admitting: Gynecologic Oncology

## 2020-09-10 DIAGNOSIS — Z20822 Contact with and (suspected) exposure to covid-19: Secondary | ICD-10-CM | POA: Insufficient documentation

## 2020-09-10 DIAGNOSIS — R971 Elevated cancer antigen 125 [CA 125]: Secondary | ICD-10-CM | POA: Diagnosis not present

## 2020-09-10 DIAGNOSIS — Z01812 Encounter for preprocedural laboratory examination: Secondary | ICD-10-CM | POA: Insufficient documentation

## 2020-09-10 DIAGNOSIS — N801 Endometriosis of ovary: Secondary | ICD-10-CM | POA: Diagnosis not present

## 2020-09-10 DIAGNOSIS — N859 Noninflammatory disorder of uterus, unspecified: Secondary | ICD-10-CM | POA: Diagnosis present

## 2020-09-10 DIAGNOSIS — Z79890 Hormone replacement therapy (postmenopausal): Secondary | ICD-10-CM | POA: Diagnosis not present

## 2020-09-10 DIAGNOSIS — Z87891 Personal history of nicotine dependence: Secondary | ICD-10-CM | POA: Diagnosis not present

## 2020-09-10 DIAGNOSIS — N838 Other noninflammatory disorders of ovary, fallopian tube and broad ligament: Secondary | ICD-10-CM | POA: Diagnosis not present

## 2020-09-10 LAB — BASIC METABOLIC PANEL
Anion gap: 9 (ref 5–15)
BUN: 16 mg/dL (ref 6–20)
CO2: 24 mmol/L (ref 22–32)
Calcium: 9.4 mg/dL (ref 8.9–10.3)
Chloride: 105 mmol/L (ref 98–111)
Creatinine, Ser: 1.01 mg/dL — ABNORMAL HIGH (ref 0.44–1.00)
GFR, Estimated: >60 mL/min (ref >60)
Glucose, Bld: 96 mg/dL (ref 70–99)
Potassium: 4.1 mmol/L (ref 3.5–5.1)
Sodium: 138 mmol/L (ref 135–145)

## 2020-09-10 LAB — URINALYSIS, ROUTINE W REFLEX MICROSCOPIC
Bilirubin Urine: NEGATIVE
Glucose, UA: NEGATIVE mg/dL
Hgb urine dipstick: NEGATIVE
Ketones, ur: NEGATIVE mg/dL
Leukocytes,Ua: NEGATIVE
Nitrite: NEGATIVE
Protein, ur: NEGATIVE mg/dL
Specific Gravity, Urine: 1.023 (ref 1.005–1.030)
pH: 5 (ref 5.0–8.0)

## 2020-09-10 LAB — CBC
HCT: 39.9 % (ref 36.0–46.0)
Hemoglobin: 13.2 g/dL (ref 12.0–15.0)
MCH: 29.3 pg (ref 26.0–34.0)
MCHC: 33.1 g/dL (ref 30.0–36.0)
MCV: 88.7 fL (ref 80.0–100.0)
Platelets: 299 10*3/uL (ref 150–400)
RBC: 4.5 MIL/uL (ref 3.87–5.11)
RDW: 13.4 % (ref 11.5–15.5)
WBC: 5.7 10*3/uL (ref 4.0–10.5)
nRBC: 0 % (ref 0.0–0.2)

## 2020-09-10 LAB — SARS CORONAVIRUS 2 (TAT 6-24 HRS): SARS Coronavirus 2: NEGATIVE

## 2020-09-10 NOTE — Progress Notes (Signed)
Spoke w/ via phone for pre-op interview--- PT Lab needs dos----  Urine preg             Lab results------ pt had CBC, BMP, T&S, UA done 09-10-2020 results in epic COVID test ------ done 09-10-2020 result in epic Arrive at ------- 1100 on 09-12-2020 NPO after MN NO Solid Food.  Clear liquids from MN until--- 1000 Medications to take morning of surgery ----- Synthroid Diabetic medication ----- n/a Patient Special Instructions ----- n/a Pre-Op special Istructions ----- n/a Patient verbalized understanding of instructions that were given at this phone interview. Patient denies shortness of breath, chest pain, fever, cough at this phone interview.

## 2020-09-11 ENCOUNTER — Telehealth: Payer: Self-pay

## 2020-09-11 NOTE — Telephone Encounter (Signed)
LM for Ms Betty Porter to call the office if she had any questions or concerns about her pre-op instructions.

## 2020-09-12 ENCOUNTER — Encounter (HOSPITAL_BASED_OUTPATIENT_CLINIC_OR_DEPARTMENT_OTHER): Payer: Self-pay | Admitting: Gynecologic Oncology

## 2020-09-12 ENCOUNTER — Encounter (HOSPITAL_BASED_OUTPATIENT_CLINIC_OR_DEPARTMENT_OTHER)
Admission: RE | Disposition: A | Discharge status: Home / Self Care | Payer: Self-pay | Source: Home / Self Care | Attending: Gynecologic Oncology

## 2020-09-12 ENCOUNTER — Other Ambulatory Visit: Payer: Self-pay

## 2020-09-12 ENCOUNTER — Ambulatory Visit (HOSPITAL_BASED_OUTPATIENT_CLINIC_OR_DEPARTMENT_OTHER): Payer: BC Managed Care – PPO | Admitting: Anesthesiology

## 2020-09-12 ENCOUNTER — Ambulatory Visit (HOSPITAL_BASED_OUTPATIENT_CLINIC_OR_DEPARTMENT_OTHER)
Admission: RE | Admit: 2020-09-12 | Discharge: 2020-09-12 | Disposition: A | Discharge status: Home / Self Care | Payer: BC Managed Care – PPO | Attending: Gynecologic Oncology | Admitting: Gynecologic Oncology

## 2020-09-12 DIAGNOSIS — N801 Endometriosis of ovary: Secondary | ICD-10-CM | POA: Diagnosis not present

## 2020-09-12 DIAGNOSIS — R971 Elevated cancer antigen 125 [CA 125]: Secondary | ICD-10-CM | POA: Insufficient documentation

## 2020-09-12 DIAGNOSIS — N83202 Unspecified ovarian cyst, left side: Secondary | ICD-10-CM

## 2020-09-12 DIAGNOSIS — Z20822 Contact with and (suspected) exposure to covid-19: Secondary | ICD-10-CM | POA: Insufficient documentation

## 2020-09-12 DIAGNOSIS — Z79890 Hormone replacement therapy (postmenopausal): Secondary | ICD-10-CM | POA: Insufficient documentation

## 2020-09-12 DIAGNOSIS — D271 Benign neoplasm of left ovary: Secondary | ICD-10-CM | POA: Diagnosis not present

## 2020-09-12 DIAGNOSIS — N838 Other noninflammatory disorders of ovary, fallopian tube and broad ligament: Secondary | ICD-10-CM | POA: Insufficient documentation

## 2020-09-12 DIAGNOSIS — Z87891 Personal history of nicotine dependence: Secondary | ICD-10-CM | POA: Insufficient documentation

## 2020-09-12 HISTORY — PX: XI ROBOTIC ASSISTED OOPHORECTOMY: SHX6823

## 2020-09-12 HISTORY — DX: Other specified conditions associated with female genital organs and menstrual cycle: N94.89

## 2020-09-12 HISTORY — DX: Other specified postprocedural states: Z98.890

## 2020-09-12 HISTORY — DX: Hypothyroidism, unspecified: E03.9

## 2020-09-12 HISTORY — DX: Other specified postprocedural states: R11.2

## 2020-09-12 HISTORY — DX: Stress incontinence (female) (male): N39.3

## 2020-09-12 LAB — TYPE AND SCREEN
ABO/RH(D): O POS
Antibody Screen: NEGATIVE

## 2020-09-12 LAB — ABO/RH: ABO/RH(D): O POS

## 2020-09-12 LAB — POCT PREGNANCY, URINE: Preg Test, Ur: NEGATIVE

## 2020-09-12 SURGERY — OOPHORECTOMY, ROBOT-ASSISTED
Anesthesia: General | Site: Abdomen

## 2020-09-12 MED ORDER — OXYCODONE HCL 5 MG/5ML PO SOLN: 5.0000 mg | Freq: Once | ORAL | Status: DC | PRN

## 2020-09-12 MED ORDER — HEPARIN SODIUM (PORCINE) 5000 UNIT/ML IJ SOLN
5000.0000 [IU] | INTRAMUSCULAR | Status: AC
  Administered 2020-09-12: 5000 [IU] via SUBCUTANEOUS

## 2020-09-12 MED ORDER — FENTANYL CITRATE (PF) 250 MCG/5ML IJ SOLN
INTRAMUSCULAR | Status: AC
  Filled 2020-09-12: qty 5

## 2020-09-12 MED ORDER — DEXAMETHASONE SODIUM PHOSPHATE 4 MG/ML IJ SOLN
INTRAMUSCULAR | Status: DC | PRN
  Administered 2020-09-12: 8 mg via INTRAVENOUS

## 2020-09-12 MED ORDER — HYDROMORPHONE HCL 1 MG/ML IJ SOLN: 0.2000 mg | INTRAMUSCULAR | Status: DC | PRN

## 2020-09-12 MED ORDER — SCOPOLAMINE 1 MG/3DAYS TD PT72
MEDICATED_PATCH | TRANSDERMAL | Status: AC
  Filled 2020-09-12: qty 1

## 2020-09-12 MED ORDER — ROCURONIUM BROMIDE 10 MG/ML (PF) SYRINGE
PREFILLED_SYRINGE | INTRAVENOUS | Status: AC
  Filled 2020-09-12: qty 10

## 2020-09-12 MED ORDER — CELECOXIB 200 MG PO CAPS
400.0000 mg | ORAL_CAPSULE | ORAL | Status: AC
  Administered 2020-09-12: 400 mg via ORAL

## 2020-09-12 MED ORDER — SODIUM CHLORIDE 0.9 % IR SOLN
Status: DC | PRN
  Administered 2020-09-12: 3000 mL

## 2020-09-12 MED ORDER — PROMETHAZINE HCL 25 MG/ML IJ SOLN
6.2500 mg | INTRAMUSCULAR | Status: DC | PRN
Start: 2020-09-12 — End: 2020-09-12

## 2020-09-12 MED ORDER — PROPOFOL 10 MG/ML IV BOLUS
INTRAVENOUS | Status: AC
  Filled 2020-09-12: qty 20

## 2020-09-12 MED ORDER — SCOPOLAMINE 1 MG/3DAYS TD PT72: 1.0000 | MEDICATED_PATCH | TRANSDERMAL | Status: DC

## 2020-09-12 MED ORDER — GABAPENTIN 300 MG PO CAPS
ORAL_CAPSULE | ORAL | Status: AC
  Filled 2020-09-12: qty 1

## 2020-09-12 MED ORDER — GLYCOPYRROLATE 0.2 MG/ML IJ SOLN
INTRAMUSCULAR | Status: DC | PRN
  Administered 2020-09-12 (×2): .1 mg via INTRAVENOUS

## 2020-09-12 MED ORDER — TRAMADOL HCL 50 MG PO TABS
ORAL_TABLET | ORAL | Status: AC
  Filled 2020-09-12: qty 1

## 2020-09-12 MED ORDER — MIDAZOLAM HCL 2 MG/2ML IJ SOLN
INTRAMUSCULAR | Status: AC
  Filled 2020-09-12: qty 2

## 2020-09-12 MED ORDER — MEPERIDINE HCL 25 MG/ML IJ SOLN
6.2500 mg | INTRAMUSCULAR | Status: DC | PRN
Start: 2020-09-12 — End: 2020-09-12

## 2020-09-12 MED ORDER — PROPOFOL 500 MG/50ML IV EMUL
INTRAVENOUS | Status: DC | PRN
  Administered 2020-09-12: 100 ug/kg/min via INTRAVENOUS

## 2020-09-12 MED ORDER — LIDOCAINE HCL (CARDIAC) PF 100 MG/5ML IV SOSY
PREFILLED_SYRINGE | INTRAVENOUS | Status: DC | PRN
  Administered 2020-09-12: 50 mg via INTRAVENOUS

## 2020-09-12 MED ORDER — LIDOCAINE HCL (PF) 2 % IJ SOLN
INTRAMUSCULAR | Status: DC | PRN
  Administered 2020-09-12: 1.5 mg/kg/h via INTRADERMAL

## 2020-09-12 MED ORDER — ONDANSETRON HCL 4 MG/2ML IJ SOLN
INTRAMUSCULAR | Status: DC | PRN
  Administered 2020-09-12: 4 mg via INTRAVENOUS

## 2020-09-12 MED ORDER — OXYCODONE HCL 5 MG PO TABS: 5.0000 mg | ORAL_TABLET | ORAL | Status: DC | PRN

## 2020-09-12 MED ORDER — SCOPOLAMINE 1 MG/3DAYS TD PT72
1.0000 | MEDICATED_PATCH | TRANSDERMAL | Status: DC
  Administered 2020-09-12: 1.5 mg via TRANSDERMAL

## 2020-09-12 MED ORDER — CELECOXIB 200 MG PO CAPS
ORAL_CAPSULE | ORAL | Status: AC
  Filled 2020-09-12: qty 2

## 2020-09-12 MED ORDER — SUGAMMADEX SODIUM 200 MG/2ML IV SOLN
INTRAVENOUS | Status: DC | PRN
  Administered 2020-09-12: 200 mg via INTRAVENOUS

## 2020-09-12 MED ORDER — ONDANSETRON HCL 4 MG/2ML IJ SOLN
INTRAMUSCULAR | Status: AC
  Filled 2020-09-12: qty 2

## 2020-09-12 MED ORDER — ACETAMINOPHEN 500 MG PO TABS
ORAL_TABLET | ORAL | Status: AC
  Filled 2020-09-12: qty 2

## 2020-09-12 MED ORDER — KETOROLAC TROMETHAMINE 30 MG/ML IJ SOLN: 30.0000 mg | Freq: Once | INTRAMUSCULAR | Status: DC | PRN

## 2020-09-12 MED ORDER — ACETAMINOPHEN 500 MG PO TABS
1000.0000 mg | ORAL_TABLET | Freq: Once | ORAL | Status: AC
  Administered 2020-09-12: 1000 mg via ORAL

## 2020-09-12 MED ORDER — GABAPENTIN 300 MG PO CAPS
300.0000 mg | ORAL_CAPSULE | ORAL | Status: AC
  Administered 2020-09-12: 300 mg via ORAL

## 2020-09-12 MED ORDER — ONDANSETRON HCL 4 MG PO TABS: 4.0000 mg | ORAL_TABLET | Freq: Four times a day (QID) | ORAL | Status: DC | PRN

## 2020-09-12 MED ORDER — LACTATED RINGERS IV SOLN: INTRAVENOUS | Status: DC

## 2020-09-12 MED ORDER — DEXAMETHASONE SODIUM PHOSPHATE 10 MG/ML IJ SOLN: 4.0000 mg | INTRAMUSCULAR | Status: DC

## 2020-09-12 MED ORDER — ACETAMINOPHEN 500 MG PO TABS: 1000.0000 mg | ORAL_TABLET | ORAL | Status: DC

## 2020-09-12 MED ORDER — KETAMINE HCL 10 MG/ML IJ SOLN
INTRAMUSCULAR | Status: AC
  Filled 2020-09-12: qty 1

## 2020-09-12 MED ORDER — LIDOCAINE HCL (PF) 2 % IJ SOLN
INTRAMUSCULAR | Status: AC
  Filled 2020-09-12: qty 5

## 2020-09-12 MED ORDER — HYDROMORPHONE HCL 1 MG/ML IJ SOLN: 0.2500 mg | INTRAMUSCULAR | Status: DC | PRN

## 2020-09-12 MED ORDER — MIDAZOLAM HCL 2 MG/2ML IJ SOLN
INTRAMUSCULAR | Status: DC | PRN
  Administered 2020-09-12: 2 mg via INTRAVENOUS

## 2020-09-12 MED ORDER — KETAMINE HCL 10 MG/ML IJ SOLN
INTRAMUSCULAR | Status: DC | PRN
  Administered 2020-09-12: 25 mg via INTRAVENOUS

## 2020-09-12 MED ORDER — TRAMADOL HCL 50 MG PO TABS
50.0000 mg | ORAL_TABLET | Freq: Four times a day (QID) | ORAL | Status: DC | PRN
  Administered 2020-09-12: 50 mg via ORAL

## 2020-09-12 MED ORDER — ONDANSETRON HCL 4 MG/2ML IJ SOLN: 4.0000 mg | Freq: Four times a day (QID) | INTRAMUSCULAR | Status: DC | PRN

## 2020-09-12 MED ORDER — FENTANYL CITRATE (PF) 100 MCG/2ML IJ SOLN
INTRAMUSCULAR | Status: DC | PRN
  Administered 2020-09-12: 50 ug via INTRAVENOUS
  Administered 2020-09-12: 25 ug via INTRAVENOUS
  Administered 2020-09-12 (×3): 50 ug via INTRAVENOUS
  Administered 2020-09-12: 25 ug via INTRAVENOUS

## 2020-09-12 MED ORDER — ROCURONIUM BROMIDE 100 MG/10ML IV SOLN
INTRAVENOUS | Status: DC | PRN
  Administered 2020-09-12: 100 mg via INTRAVENOUS

## 2020-09-12 MED ORDER — BUPIVACAINE HCL 0.25 % IJ SOLN
INTRAMUSCULAR | Status: DC | PRN
  Administered 2020-09-12: 16 mL

## 2020-09-12 MED ORDER — DEXAMETHASONE SODIUM PHOSPHATE 10 MG/ML IJ SOLN
INTRAMUSCULAR | Status: AC
  Filled 2020-09-12: qty 1

## 2020-09-12 MED ORDER — HEPARIN SODIUM (PORCINE) 5000 UNIT/ML IJ SOLN
INTRAMUSCULAR | Status: AC
  Filled 2020-09-12: qty 1

## 2020-09-12 MED ORDER — PROPOFOL 10 MG/ML IV BOLUS
INTRAVENOUS | Status: DC | PRN
  Administered 2020-09-12 (×2): 50 mg via INTRAVENOUS
  Administered 2020-09-12: 200 mg via INTRAVENOUS
  Administered 2020-09-12: 50 mg via INTRAVENOUS

## 2020-09-12 MED ORDER — OXYCODONE HCL 5 MG PO TABS: 5.0000 mg | ORAL_TABLET | Freq: Once | ORAL | Status: DC | PRN

## 2020-09-12 SURGICAL SUPPLY — 68 items
ADH SKN CLS APL DERMABOND .7 (GAUZE/BANDAGES/DRESSINGS) ×2
AGENT HMST KT MTR STRL THRMB (HEMOSTASIS)
APL ESCP 34 STRL LF DISP (HEMOSTASIS) ×2
APPLICATOR SURGIFLO ENDO (HEMOSTASIS) ×3 IMPLANT
BAG LAPAROSCOPIC 12 15 PORT 16 (BASKET) ×2 IMPLANT
BAG RETRIEVAL 12/15 (BASKET) ×3
BAG SPEC RTRVL LRG 6X4 10 (ENDOMECHANICALS)
BLADE SURG 10 STRL SS (BLADE) IMPLANT
COVER BACK TABLE 60X90IN (DRAPES) ×3 IMPLANT
COVER TIP SHEARS 8 DVNC (MISCELLANEOUS) ×2 IMPLANT
COVER TIP SHEARS 8MM DA VINCI (MISCELLANEOUS) ×1
COVER WAND RF STERILE (DRAPES) ×3 IMPLANT
DECANTER SPIKE VIAL GLASS SM (MISCELLANEOUS) IMPLANT
DERMABOND ADVANCED (GAUZE/BANDAGES/DRESSINGS) ×1
DERMABOND ADVANCED .7 DNX12 (GAUZE/BANDAGES/DRESSINGS) ×2 IMPLANT
DRAPE ARM DVNC X/XI (DISPOSABLE) ×8 IMPLANT
DRAPE COLUMN DVNC XI (DISPOSABLE) ×2 IMPLANT
DRAPE DA VINCI XI ARM (DISPOSABLE) ×4
DRAPE DA VINCI XI COLUMN (DISPOSABLE) ×1
DRAPE SHEET LG 3/4 BI-LAMINATE (DRAPES) ×3 IMPLANT
DRAPE SURG IRRIG POUCH 19X23 (DRAPES) ×3 IMPLANT
ELECT REM PT RETURN 9FT ADLT (ELECTROSURGICAL) ×3
ELECTRODE REM PT RTRN 9FT ADLT (ELECTROSURGICAL) ×2 IMPLANT
GAUZE 4X4 16PLY RFD (DISPOSABLE) ×3 IMPLANT
GLOVE SURG ENC MOIS LTX SZ6 (GLOVE) ×12 IMPLANT
GLOVE SURG ENC MOIS LTX SZ6.5 (GLOVE) ×6 IMPLANT
HOLDER FOLEY CATH W/STRAP (MISCELLANEOUS) ×3 IMPLANT
IRRIG SUCT STRYKERFLOW 2 WTIP (MISCELLANEOUS) ×3
IRRIGATION SUCT STRKRFLW 2 WTP (MISCELLANEOUS) ×2 IMPLANT
KIT PROCEDURE DA VINCI SI (MISCELLANEOUS)
KIT PROCEDURE DVNC SI (MISCELLANEOUS) IMPLANT
KIT TURNOVER CYSTO (KITS) ×3 IMPLANT
LEGGING LITHOTOMY PAIR STRL (DRAPES) ×3 IMPLANT
MANIPULATOR UTERINE 4.5 ZUMI (MISCELLANEOUS) ×3 IMPLANT
NDL SAFETY ECLIPSE 18X1.5 (NEEDLE) IMPLANT
NEEDLE HYPO 18GX1.5 SHARP (NEEDLE)
NEEDLE HYPO 22GX1.5 SAFETY (NEEDLE) ×3 IMPLANT
NEEDLE SPNL 18GX3.5 QUINCKE PK (NEEDLE) IMPLANT
OBTURATOR OPTICAL STANDARD 8MM (TROCAR) ×1
OBTURATOR OPTICAL STND 8 DVNC (TROCAR) ×2
OBTURATOR OPTICALSTD 8 DVNC (TROCAR) ×2 IMPLANT
PACK ROBOT GYN CUSTOM WL (TRAY / TRAY PROCEDURE) ×3 IMPLANT
PACK ROBOTIC GOWN (GOWN DISPOSABLE) ×3 IMPLANT
PAD POSITIONING PINK XL (MISCELLANEOUS) ×3 IMPLANT
PENCIL SMOKE EVACUATOR (MISCELLANEOUS) IMPLANT
PORT ACCESS TROCAR AIRSEAL 12 (TROCAR) ×2 IMPLANT
PORT ACCESS TROCAR AIRSEAL 5M (TROCAR) ×1
PORT LAP GEL ALEXIS MED 5-9CM (MISCELLANEOUS) IMPLANT
POUCH SPECIMEN RETRIEVAL 10MM (ENDOMECHANICALS) IMPLANT
SEAL CANN UNIV 5-8 DVNC XI (MISCELLANEOUS) ×6 IMPLANT
SEAL XI 5MM-8MM UNIVERSAL (MISCELLANEOUS) ×3
SET TRI-LUMEN FLTR TB AIRSEAL (TUBING) ×3 IMPLANT
SURGIFLO W/THROMBIN 8M KIT (HEMOSTASIS) IMPLANT
SUT MNCRL AB 4-0 PS2 18 (SUTURE) IMPLANT
SUT VIC AB 0 CT1 27 (SUTURE)
SUT VIC AB 0 CT1 27XBRD ANTBC (SUTURE) IMPLANT
SUT VIC AB 3-0 SH 27 (SUTURE)
SUT VIC AB 3-0 SH 27X BRD (SUTURE) IMPLANT
SUT VIC AB 4-0 PS2 18 (SUTURE) ×6 IMPLANT
SYR 10ML LL (SYRINGE) IMPLANT
SYR CONTROL 10ML LL (SYRINGE) ×6 IMPLANT
TRAP SPECIMEN MUCUS 40CC (MISCELLANEOUS) ×3 IMPLANT
TRAY FOLEY W/BAG SLVR 14FR LF (SET/KITS/TRAYS/PACK) ×3 IMPLANT
TROCAR BLADELESS OPT 12M 100M (ENDOMECHANICALS) IMPLANT
TUBE CONNECTING 12X1/4 (SUCTIONS) ×3 IMPLANT
UNDERPAD 30X36 HEAVY ABSORB (UNDERPADS AND DIAPERS) ×3 IMPLANT
WATER STERILE IRR 1000ML POUR (IV SOLUTION) ×3 IMPLANT
YANKAUER SUCT BULB TIP NO VENT (SUCTIONS) IMPLANT

## 2020-09-12 NOTE — Op Note (Signed)
OPERATIVE NOTE  Pre-operative Diagnosis: Complex adnexal mass, elevated CA-125  Post-operative Diagnosis: same as above, advanced endometriosis  Operation: Robotic-assisted laparoscopic left salpingoophorectomy, right salpingectomy  Surgeon: Jeral Pinch MD  Assistant Surgeon: Joylene John NP  Anesthesia: GET  Urine Output: 100cc  Operative Findings: On EUA, minimally mobile uterus, no nodularity; uterus moves in combination with mass appreciated on abdominal exam. On intra-abdominal entry, normal upper abdominal survey. Normal omentum and small bowel. Left ovary replaced by smooth 10cm mass. During manipulation of this mass during dissection, there was drainage of chocolate brown fluid c/w an endometrioma. Bilateral tubes were normal appearing with several small para-tubal cysts and endosalpinosis. The uterus was covered with what appeared to be endosalpingiosis. There were powder burn lesions noted along the pelvic peritoneum c/w endometriosis. Uterus enlarged with multiple intra-mural fibroids, overall 10-12cm. Loop of sigmoid densely adherent to the left ovary and posterior uterus. Right ovary normal in appearance.   Frozen section c/w a benign cyst, suspected endometrioma.  Estimated Blood Loss:  less than 100 mL      Total IV Fluids: see I&O flowsheet         Specimens: bilateral tubes and left ovary         Complications:  None apparent; patient tolerated the procedure well.         Disposition: PACU - hemodynamically stable.  Procedure Details  The patient was seen in the Holding Room. The risks, benefits, complications, treatment options, and expected outcomes were discussed with the patient.  The patient concurred with the proposed plan, giving informed consent.  The site of surgery properly noted/marked. The patient was identified as Betty Porter and the procedure verified as a Robotic-assisted UO, BS, possible staging.  After induction of anesthesia, the  patient was draped and prepped in the usual sterile manner. Patient was placed in supine position after anesthesia and draped and prepped in the usual sterile manner as follows: Her arms were tucked to her side with all appropriate precautions.  The shoulders were stabilized with padded shoulder blocks applied to the acromium processes.  The patient was placed in the semi-lithotomy position in [place].  The perineum and vagina were prepped with CholoraPrep. The patient was draped after the CholoraPrep had been allowed to dry for 3 minutes.  A Time Out was held and the above information confirmed.  The urethra was prepped with Betadine. Foley catheter was placed.  A sterile speculum was placed in the vagina.  The cervix was grasped with a single-tooth tenaculum. The cervix was dilated with Kennon Rounds dilators.  The ZUMI uterine manipulator was placed without difficulty. G tube placement was confirmed and to suction.   Next, a 10 mm skin incision was made 1 cm below the subcostal margin in the midclavicular line.  The 5 mm Optiview port and scope was used for direct entry.  Opening pressure was under 10 mm CO2.  The abdomen was insufflated and the findings were noted as above.   At this point and all points during the procedure, the patient's intra-abdominal pressure did not exceed 15 mmHg. Next, an 8 mm skin incision was made superior to the umbilicus and a right and left port were placed about 8 cm lateral to the robot port on the right and left side.  A fourth arm was placed on the right.  The 5 mm assist trocar was exchanged for a 10-12 mm port. All ports were placed under direct visualization.  The patient was placed in steep Trendelenburg.  The robot was docked in the normal manner.  The mesentery of the sigmoid was sharply dissected from the left sidewall and IP ligamented. The left peritoneum was opened parallel to the IP ligament to open the retroperitoneal space. The ureter was noted above the pelvic  brim before crossing the iliac vessels.  The peritoneum above the ureter was incised and stretched and the infundibulopelvic ligament was skeletonized, cauterized and cut. The medial leaf of the broad ligament was cauterized and transected inferiorly toward the pelvis. Given adhesions of the colon to the ovary and uterus, attention was then turned posteriorly. A combination of sharp and blunt dissection was used to lyse adhesions of the sigmoid to the ovary, ultimately freeing the ovary from the colon. The utero-ovarian ligament was difficult to distinguish and ultimately what appeared to be the junction between the ovarian mass and the uterus was identified, isolated, cauterized, and transected, freeing the left adnexa. This was then placed in an Endocatch bag.  Attention was then turned to the right and the right tube was elevated. A combination of cautery and sharp dissection was used to free the fallopian tube from the underlying ovary and along the mesosalpinx. Just lateral to the uterine fundus, the tube was cauterized and transected. The tube was then handed off the field.  The pelvis and mid abdomen was irrigated to remove as much of the cyst fluid from the abdomen. Floseal was placed within the surgical bed and along the left pelvic sidewall. Irrigation was used and excellent hemostasis was achieved.  At this point in the procedure was completed.  Robotic instruments were removed under direct visulaization.  The Endocatch bag was brought through the assist trocar and the ovary and tube removed from the bag under direct visualization. The specimen was handed off the field and sent for frozen. The robot was undocked. The fascia at the 10-12 mm port was closed with 0 Vicryl on a UR-5 needle.  The subcuticular tissue was closed with 4-0 Vicryl and the skin was closed with 4-0 Monocryl in a subcuticular manner.  Dermabond was applied.    The vagina was swabbed with  minimal bleeding noted.   All sponge,  lap and needle counts were correct x  3. Foley catheter was removed.  The patient was transferred to the recovery room in stable condition.  Jeral Pinch, MD

## 2020-09-12 NOTE — Interval H&P Note (Signed)
History and Physical Interval Note:  09/12/2020 12:02 PM  Betty Porter  has presented today for surgery, with the diagnosis of COMPLEX ADNEXAL MASS, ELEVATED CA 125.  The various methods of treatment have been discussed with the patient and family. After consideration of risks, benefits and other options for treatment, the patient has consented to  Procedure(s): XI ROBOTIC ASSISTED UNILATERAL OOPHORECTOMY (N/A) XI ROBOTIC ASSISTED BILATERAL SALPINGECTOMY  POSSIBLE STAGING (N/A) as a surgical intervention.  The patient's history has been reviewed, patient examined, no change in status, stable for surgery.  I have reviewed the patient's chart and labs.  Questions were answered to the patient's satisfaction.     Lafonda Mosses

## 2020-09-12 NOTE — Anesthesia Preprocedure Evaluation (Addendum)
Anesthesia Evaluation  Patient identified by MRN, date of birth, ID band Patient awake    Reviewed: Allergy & Precautions, NPO status , Patient's Chart, lab work & pertinent test results  History of Anesthesia Complications (+) PONV and history of anesthetic complications  Airway Mallampati: II  TM Distance: >3 FB Neck ROM: Full    Dental no notable dental hx. (+) Teeth Intact, Dental Advisory Given   Pulmonary former smoker,  Quit smoking 1998, 7 pack year history    Pulmonary exam normal breath sounds clear to auscultation       Cardiovascular negative cardio ROS Normal cardiovascular exam Rhythm:Regular Rate:Normal     Neuro/Psych negative neurological ROS  negative psych ROS   GI/Hepatic negative GI ROS, Neg liver ROS,   Endo/Other  Hypothyroidism Obesity BMI 34  Renal/GU negative Renal ROS Bladder dysfunction (stress incontinence) Female GU complaint (complex adnexal mass, elevated Ca 125, fibroid uterus)     Musculoskeletal negative musculoskeletal ROS (+)   Abdominal (+) + obese,   Peds  Hematology negative hematology ROS (+) hct 39.9   Anesthesia Other Findings   Reproductive/Obstetrics negative OB ROS                           Anesthesia Physical Anesthesia Plan  ASA: II  Anesthesia Plan: General   Post-op Pain Management:    Induction: Intravenous  PONV Risk Score and Plan: 4 or greater and Ondansetron, Dexamethasone, Propofol infusion, Midazolam, Scopolamine patch - Pre-op, Treatment may vary due to age or medical condition, Diphenhydramine and Metaclopromide  Airway Management Planned: Oral ETT  Additional Equipment: None  Intra-op Plan:   Post-operative Plan: Extubation in OR  Informed Consent: I have reviewed the patients History and Physical, chart, labs and discussed the procedure including the risks, benefits and alternatives for the proposed anesthesia  with the patient or authorized representative who has indicated his/her understanding and acceptance.     Dental advisory given  Plan Discussed with: CRNA  Anesthesia Plan Comments:        Anesthesia Quick Evaluation

## 2020-09-12 NOTE — Discharge Instructions (Addendum)
09/12/2020  Dr. Berline Lopes removed your left ovary with the cyst along with both fallopian tubes. During the surgery, the pathologist looked at the cyst and felt it was benign (no cancer).  Return to work: 4-6 weeks if applicable  Activity: 1. Be up and out of the bed during the day.  Take a nap if needed.  You may walk up steps but be careful and use the hand rail.  Stair climbing will tire you more than you think, you may need to stop part way and rest.   2. No lifting or straining for 6 weeks.  3. No driving for around 1 week(s).  Do not drive if you are taking narcotic pain medicine.  4. Shower daily. No tub baths until cleared by your surgeon.   5. No sexual activity and nothing in the vagina for 4 weeks.  6. You may experience a small amount of clear drainage from your incisions, which is normal.  If the drainage persists or increases, please call the office.  7. You may experience vaginal spotting after surgery.  The spotting is normal but if you experience heavy bleeding, call our office.  8. Take Tylenol or ibuprofen first for pain and only use narcotic pain medication for severe pain not relieved by the Tylenol or Ibuprofen.  Monitor your Tylenol intake to a max of 4,000 mg.  Diet: 1. Low sodium Heart Healthy Diet is recommended.  2. It is safe to use a laxative, such as Miralax or Colace, if you have difficulty moving your bowels. You can take Sennakot at bedtime every evening to keep bowel movements regular and to prevent constipation.    Wound Care: 1. Keep clean and dry.  Shower daily.  Reasons to call the Doctor:  Fever - Oral temperature greater than 100.4 degrees Fahrenheit  Foul-smelling vaginal discharge  Difficulty urinating  Nausea and vomiting  Increased pain at the site of the incision that is unrelieved with pain medicine.  Difficulty breathing with or without chest pain  New calf pain especially if only on one side  Sudden, continuing increased  vaginal bleeding with or without clots.   Contacts: For questions or concerns you should contact:  Dr. Jeral Pinch at [phone]  Joylene John, NP at [phone]  After Hours: call [phone] and have the GYN Oncologist paged/contacted   Post Anesthesia Home Care Instructions  Activity: Get plenty of rest for the remainder of the day. A responsible individual must stay with you for 24 hours following the procedure.  For the next 24 hours, DO NOT: -Drive a car -Paediatric nurse -Drink alcoholic beverages -Take any medication unless instructed by your physician -Make any legal decisions or sign important papers.  Meals: Start with liquid foods such as gelatin or soup. Progress to regular foods as tolerated. Avoid greasy, spicy, heavy foods. If nausea and/or vomiting occur, drink only clear liquids until the nausea and/or vomiting subsides. Call your physician if vomiting continues.  Special Instructions/Symptoms: Your throat may feel dry or sore from the anesthesia or the breathing tube placed in your throat during surgery. If this causes discomfort, gargle with warm salt water. The discomfort should disappear within 24 hours.  If you had a scopolamine patch placed behind your ear for the management of post- operative nausea and/or vomiting:  1. The medication in the patch is effective for 72 hours, after which it should be removed.  Wrap patch in a tissue and discard in the trash. Wash hands thoroughly with soap and  water. 2. You may remove the patch earlier than 72 hours if you experience unpleasant side effects which may include dry mouth, dizziness or visual disturbances. 3. Avoid touching the patch. Wash your hands with soap and water after contact with the patch.    Remove patch behind left ear by Saturday, September 15, 2020.  DISCHARGE INSTRUCTIONS: Laparoscopy  The following instructions have been prepared to help you care for yourself upon your return home  today.  Wound care: Marland Kitchen Do not get the incision wet for the first 24 hours. The incision should be kept clean and dry. . The Band-Aids or dressings may be removed the day after surgery. . Should the incision become sore, red, and swollen after the first week, check with your doctor.  Personal hygiene: . Shower the day after your procedure.  Activity and limitations: . Do NOT drive or operate any equipment today. . Do NOT lift anything more than 15 pounds for 2-3 weeks after surgery. . Do NOT rest in bed all day. . Walking is encouraged. Walk each day, starting slowly with 5-minute walks 3 or 4 times a day. Slowly increase the length of your walks. . Walk up and down stairs slowly. . Do NOT do strenuous activities, such as golfing, playing tennis, bowling, running, biking, weight lifting, gardening, mowing, or vacuuming for 2-4 weeks. Ask your doctor when it is okay to start.  Diet: Eat a light meal as desired this evening. You may resume your usual diet tomorrow.  Return to work: This is dependent on the type of work you do. For the most part you can return to a desk job within a week of surgery. If you are more active at work, please discuss this with your doctor.  What to expect after your surgery: You may have a slight burning sensation when you urinate on the first day. You may have a very small amount of blood in the urine. Expect to have a small amount of vaginal discharge/light bleeding for 1-2 weeks. It is not unusual to have abdominal soreness and bruising for up to 2 weeks. You may be tired and need more rest for about 1 week. You may experience shoulder pain for 24-72 hours. Lying flat in bed may relieve it.  Call your doctor for any of the following: . Develop a fever of 100.4 or greater . Inability to urinate 6 hours after discharge from hospital . Severe pain not relieved by pain medications . Persistent of heavy bleeding at incision site . Redness or swelling around incision  site after a week . Increasing nausea or vomiting

## 2020-09-12 NOTE — Transfer of Care (Signed)
Immediate Anesthesia Transfer of Care Note  Patient: Betty Porter  Procedure(s) Performed: XI ROBOTIC ASSISTED BILATERAL SALPINGECTOMY AND LEFT OOPHORECTOMY (N/A Abdomen)  Patient Location: PACU  Anesthesia Type:General  Level of Consciousness: drowsy and patient cooperative  Airway & Oxygen Therapy: Patient Spontanous Breathing and Patient connected to nasal cannula oxygen  Post-op Assessment: Report given to RN and Post -op Vital signs reviewed and stable  Post vital signs: Reviewed and stable  Last Vitals:  Vitals Value Taken Time  BP    Temp    Pulse 94 09/12/20 1612  Resp 17 09/12/20 1612  SpO2 100 % 09/12/20 1612  Vitals shown include unvalidated device data.  Last Pain:  Vitals:   09/12/20 1127  TempSrc: Oral  PainSc: 0-No pain      Patients Stated Pain Goal: 5 (85/88/50 2774)  Complications: No complications documented.

## 2020-09-12 NOTE — Anesthesia Postprocedure Evaluation (Signed)
Anesthesia Post Note  Patient: Betty Porter  Procedure(s) Performed: XI ROBOTIC ASSISTED BILATERAL SALPINGECTOMY AND LEFT OOPHORECTOMY (N/A Abdomen)     Patient location during evaluation: PACU Anesthesia Type: General Level of consciousness: sedated Pain management: pain level controlled Vital Signs Assessment: post-procedure vital signs reviewed and stable Respiratory status: spontaneous breathing and respiratory function stable Cardiovascular status: stable Postop Assessment: no apparent nausea or vomiting Anesthetic complications: no   No complications documented.  Last Vitals:  Vitals:   09/12/20 1645 09/12/20 1700  BP: (!) 150/87 (!) 146/82  Pulse: 84 87  Resp: 15 17  Temp:    SpO2: 98% 95%    Last Pain:  Vitals:   09/12/20 1645  TempSrc:   PainSc: 0-No pain                 Divit Stipp DANIEL

## 2020-09-12 NOTE — Anesthesia Procedure Notes (Signed)
Procedure Name: Intubation Date/Time: 09/12/2020 1:57 PM Performed by: Georgeanne Nim, CRNA Pre-anesthesia Checklist: Patient identified, Emergency Drugs available, Suction available, Patient being monitored and Timeout performed Patient Re-evaluated:Patient Re-evaluated prior to induction Oxygen Delivery Method: Circle system utilized Preoxygenation: Pre-oxygenation with 100% oxygen Induction Type: IV induction Ventilation: Mask ventilation without difficulty Laryngoscope Size: Mac and 4 Grade View: Grade II Number of attempts: 1 Airway Equipment and Method: Stylet Placement Confirmation: ETT inserted through vocal cords under direct vision,  positive ETCO2,  CO2 detector and breath sounds checked- equal and bilateral Secured at: 20 cm Tube secured with: Tape Dental Injury: Teeth and Oropharynx as per pre-operative assessment

## 2020-09-13 ENCOUNTER — Telehealth: Payer: Self-pay

## 2020-09-13 ENCOUNTER — Encounter (HOSPITAL_BASED_OUTPATIENT_CLINIC_OR_DEPARTMENT_OTHER): Payer: Self-pay | Admitting: Gynecologic Oncology

## 2020-09-13 NOTE — Telephone Encounter (Signed)
Betty Porter states that she is eating,drinking, and urinating well. She has not passed gas. Has burped a few times.  No N/V Told her to begin the senokot-s 2 tabs bid with 8 oz of water. If no BM by Saturday 09-15-20,  She is to add a capful of Miralax bid.  Told her that she needs to be diligent with bowels as Dr. Berline Lopes had to manipulate them a fair amount during surgery.  Need to be sure to have bowels wake up and function properly.  Told her to call if she develops severe N/V and or abdominal pain as this could be her bowel is not functioning. Pt verbalized understanding. Afebrile. Incisions D&I Pt aware of post op appointments as well as the office number [phone] and after hours number [phone] to call if she has any questions or concerns

## 2020-09-14 ENCOUNTER — Encounter: Payer: Self-pay | Admitting: Gynecologic Oncology

## 2020-09-14 LAB — SURGICAL PATHOLOGY

## 2020-09-14 LAB — CYTOLOGY - NON PAP

## 2020-10-08 ENCOUNTER — Telehealth: Payer: Self-pay

## 2020-10-08 NOTE — Telephone Encounter (Signed)
TC to review MU, no answer, left message to return call.  

## 2020-10-09 ENCOUNTER — Other Ambulatory Visit: Payer: Self-pay

## 2020-10-09 ENCOUNTER — Encounter: Payer: Self-pay | Admitting: Gynecologic Oncology

## 2020-10-09 ENCOUNTER — Inpatient Hospital Stay: Payer: BC Managed Care – PPO | Attending: Gynecologic Oncology | Admitting: Gynecologic Oncology

## 2020-10-09 VITALS — BP 132/73 | HR 88 | Temp 97.3°F | Resp 18 | Ht 61.0 in | Wt 180.0 lb

## 2020-10-09 DIAGNOSIS — Z9079 Acquired absence of other genital organ(s): Secondary | ICD-10-CM

## 2020-10-09 DIAGNOSIS — Z90721 Acquired absence of ovaries, unilateral: Secondary | ICD-10-CM

## 2020-10-09 DIAGNOSIS — N83202 Unspecified ovarian cyst, left side: Secondary | ICD-10-CM

## 2020-10-09 NOTE — Progress Notes (Signed)
Gynecologic Oncology Return Clinic Visit  10/09/20  Reason for Visit: post-op follow-up  Treatment History: Presented with complex adnexal mass and mildly elevated Ca1 48 09/12/2020: Robotic assisted LSO and right salpingectomy.  Pathology showed benign ovary with endometriosis and corpus luteum.  Benign fallopian tubes.  Interval History: Patient presents today for follow-up after surgery.  She notes overall continuing to progress daily.  She had significant pain after surgery which has also improved with time.  She notes some hot flashes since surgery.  She also describes having fatigue and at times getting a little short of breath or needing to take deeper breaths to catch her breath.  She is tolerating a regular diet and denies any nausea or emesis.  She reports bowel function is almost back to baseline and denies any urinary symptoms.  She has not had a menses since surgery.  Past Medical/Surgical History: Past Medical History:  Diagnosis Date  . Adnexal mass    complex  . Fibroid, uterine 2008  . Hypothyroidism    followed by pcp  . PONV (postoperative nausea and vomiting)   . SUI (stress urinary incontinence, female)     Past Surgical History:  Procedure Laterality Date  . CESAREAN SECTION  2009  . LAPAROSCOPY  2008   for infertility  . TONSILLECTOMY  1977  . XI ROBOTIC ASSISTED OOPHORECTOMY N/A 09/12/2020   Procedure: XI ROBOTIC ASSISTED BILATERAL SALPINGECTOMY AND LEFT OOPHORECTOMY;  Surgeon: Lafonda Mosses, MD;  Location: [facility];  Service: Gynecology;  Laterality: N/A;    Family History  Problem Relation Age of Onset  . Breast cancer Maternal Aunt   . Hypertension Mother   . Osteoporosis Mother   . Arthritis Mother   . Diabetes Father   . Thyroid cancer Maternal Aunt   . Leukemia Maternal Uncle   . Colon cancer Neg Hx   . Ovarian cancer Neg Hx   . Endometrial cancer Neg Hx   . Pancreatic cancer Neg Hx   . Prostate cancer Neg Hx      Social History   Socioeconomic History  . Marital status: Married    Spouse name: Not on file  . Number of children: Not on file  . Years of education: Not on file  . Highest education level: Not on file  Occupational History  . Occupation: Education officer, environmental  Tobacco Use  . Smoking status: Former Smoker    Packs/day: 0.75    Years: 10.00    Pack years: 7.50    Types: Cigarettes    Quit date: 08/11/1996    Years since quitting: 24.1  . Smokeless tobacco: Never Used  Vaping Use  . Vaping Use: Never used  Substance and Sexual Activity  . Alcohol use: Yes    Comment: socially   . Drug use: Never  . Sexual activity: Yes    Birth control/protection: None  Other Topics Concern  . Not on file  Social History Narrative  . Not on file   Social Determinants of Health   Financial Resource Strain: Not on file  Food Insecurity: Not on file  Transportation Needs: Not on file  Physical Activity: Not on file  Stress: Not on file  Social Connections: Not on file    Current Medications:  Current Outpatient Medications:  .  gemfibrozil (LOPID) 600 MG tablet, Take 600 mg by mouth in the morning and at bedtime., Disp: , Rfl:  .  levothyroxine (SYNTHROID) 75 MCG tablet, Take 75 mcg by mouth daily  before breakfast., Disp: , Rfl:  .  Multiple Vitamin (MULTIVITAMIN WITH MINERALS) TABS tablet, Take 1 tablet by mouth daily., Disp: , Rfl:   Review of Systems: Denies appetite changes, fevers, chills, fatigue, unexplained weight changes. Denies hearing loss, neck lumps or masses, mouth sores, ringing in ears or voice changes. Denies cough or wheezing.  Denies shortness of breath. Denies chest pain or palpitations. Denies leg swelling. Denies abdominal distention, pain, blood in stools, constipation, diarrhea, nausea, vomiting, or early satiety. Denies pain with intercourse, dysuria, frequency, hematuria or incontinence. Denies hot flashes, pelvic pain, vaginal bleeding or vaginal  discharge.   Denies joint pain, back pain or muscle pain/cramps. Denies itching, rash, or wounds. Denies dizziness, headaches, numbness or seizures. Denies swollen lymph nodes or glands, denies easy bruising or bleeding. Denies anxiety, depression, confusion, or decreased concentration.  Physical Exam: BP 132/73 (BP Location: Left Arm, Patient Position: Sitting)   Pulse 88   Temp (!) 97.3 F (36.3 C) (Tympanic)   Resp 18   Ht 5\' 1"  (1.549 m)   Wt 180 lb (81.6 kg)   SpO2 98% Comment: RA  BMI 34.01 kg/m  General: Alert, oriented, no acute distress. HEENT: Normocephalic, atraumatic, sclera anicteric. Chest: Unlabored breathing on room air. Abdomen: Obese, soft, nontender.  Normoactive bowel sounds.  No masses or hepatosplenomegaly appreciated.  Well-healing laparoscopic incisions.  Remaining Dermabond removed. Extremities: Grossly normal range of motion.  Warm, well perfused.  No edema bilaterally.  Laboratory & Radiologic Studies: None new  Assessment & Plan: Betty Porter is a 50 y.o. woman s/p robotic surgery including bilateral salpingectomy and unilateral oophorectomy for a complex adnexal mass and mildly elevated Ca1 25 with findings at the time of surgery and final pathology confirming endometriosis.  Patient is overall doing well from a postoperative standpoint.  Given the adhesions at the time of her surgery, I expected her to have a somewhat slower recovery and thus her description is within this category.  We discussed findings again at the time of surgery.  I think that given stress of surgery would very likely explain missing her normal menses.  We also discussed that some hot flashes can be normal around the time of surgery.  I left her right ovary in situ and thus I would not expect a significant change in hormone levels after removal of her left ovary.  I suspect that she will continue to improve from a postoperative standpoint and we will plan to have a phone visit  in 6-8 weeks to assure continued improvement.  She knows to reach out if she develops any concerning symptoms in the meantime.  32 minutes of total time was spent for this patient encounter, including preparation, face-to-face counseling with the patient and coordination of care, and documentation of the encounter.  Jeral Pinch, MD  Division of Gynecologic Oncology  Department of Obstetrics and Gynecology  [facility]

## 2020-10-09 NOTE — Patient Instructions (Signed)
You are healing great from surgery!  You can shower and care for your incisions normally.  In terms of activity restrictions, you can start increasing your physical activity.  I do not want you to lift push or pull any more than 10 to 15 pounds until you are 6 weeks out from surgery.  At that point, you are released back to normal activity but increase that activity level slowly.  Please keep me posted in the next couple of months if you feel like you are having symptoms that are lingering longer than you would expect from a recovery standpoint.  Everything that we talked about today sounds like normal post surgery healing.  Releasing you back to your gynecologist for routine women's health care such as Pap smears and exams.

## 2020-11-06 ENCOUNTER — Other Ambulatory Visit: Payer: Self-pay

## 2020-11-06 ENCOUNTER — Encounter (INDEPENDENT_AMBULATORY_CARE_PROVIDER_SITE_OTHER): Payer: Self-pay | Admitting: Family Medicine

## 2020-11-06 ENCOUNTER — Ambulatory Visit (INDEPENDENT_AMBULATORY_CARE_PROVIDER_SITE_OTHER): Payer: BC Managed Care – PPO | Admitting: Family Medicine

## 2020-11-06 VITALS — BP 103/69 | HR 69 | Temp 97.9°F | Ht 61.0 in | Wt 178.0 lb

## 2020-11-06 DIAGNOSIS — E038 Other specified hypothyroidism: Secondary | ICD-10-CM | POA: Diagnosis not present

## 2020-11-06 DIAGNOSIS — Z0289 Encounter for other administrative examinations: Secondary | ICD-10-CM

## 2020-11-06 DIAGNOSIS — Z9189 Other specified personal risk factors, not elsewhere classified: Secondary | ICD-10-CM | POA: Diagnosis not present

## 2020-11-06 DIAGNOSIS — Z6833 Body mass index (BMI) 33.0-33.9, adult: Secondary | ICD-10-CM

## 2020-11-06 DIAGNOSIS — R5383 Other fatigue: Secondary | ICD-10-CM

## 2020-11-06 DIAGNOSIS — R0602 Shortness of breath: Secondary | ICD-10-CM | POA: Diagnosis not present

## 2020-11-06 DIAGNOSIS — F3289 Other specified depressive episodes: Secondary | ICD-10-CM | POA: Diagnosis not present

## 2020-11-06 DIAGNOSIS — E669 Obesity, unspecified: Secondary | ICD-10-CM

## 2020-11-06 DIAGNOSIS — E782 Mixed hyperlipidemia: Secondary | ICD-10-CM

## 2020-11-07 LAB — COMPREHENSIVE METABOLIC PANEL
ALT: 23 IU/L (ref 0–32)
AST: 21 IU/L (ref 0–40)
Albumin/Globulin Ratio: 1.5 (ref 1.2–2.2)
Albumin: 4.5 g/dL (ref 3.8–4.8)
Alkaline Phosphatase: 88 IU/L (ref 44–121)
BUN/Creatinine Ratio: 17 (ref 9–23)
BUN: 15 mg/dL (ref 6–24)
Bilirubin Total: 0.3 mg/dL (ref 0.0–1.2)
CO2: 19 mmol/L — ABNORMAL LOW (ref 20–29)
Calcium: 9.1 mg/dL (ref 8.7–10.2)
Chloride: 104 mmol/L (ref 96–106)
Creatinine, Ser: 0.89 mg/dL (ref 0.57–1.00)
Globulin, Total: 3 g/dL (ref 1.5–4.5)
Glucose: 85 mg/dL (ref 65–99)
Potassium: 4.5 mmol/L (ref 3.5–5.2)
Sodium: 140 mmol/L (ref 134–144)
Total Protein: 7.5 g/dL (ref 6.0–8.5)
eGFR: 80 mL/min/{1.73_m2} (ref >59)

## 2020-11-07 LAB — CBC WITH DIFFERENTIAL/PLATELET
Basophils Absolute: 0 10*3/uL (ref 0.0–0.2)
Basos: 1 %
EOS (ABSOLUTE): 0.3 10*3/uL (ref 0.0–0.4)
Eos: 4 %
Hematocrit: 38.8 % (ref 34.0–46.6)
Hemoglobin: 13 g/dL (ref 11.1–15.9)
Immature Grans (Abs): 0 10*3/uL (ref 0.0–0.1)
Immature Granulocytes: 0 %
Lymphocytes Absolute: 1.7 10*3/uL (ref 0.7–3.1)
Lymphs: 28 %
MCH: 29.1 pg (ref 26.6–33.0)
MCHC: 33.5 g/dL (ref 31.5–35.7)
MCV: 87 fL (ref 79–97)
Monocytes Absolute: 0.4 10*3/uL (ref 0.1–0.9)
Monocytes: 7 %
Neutrophils Absolute: 3.7 10*3/uL (ref 1.4–7.0)
Neutrophils: 60 %
Platelets: 299 10*3/uL (ref 150–450)
RBC: 4.46 x10E6/uL (ref 3.77–5.28)
RDW: 13.1 % (ref 11.7–15.4)
WBC: 6.1 10*3/uL (ref 3.4–10.8)

## 2020-11-07 LAB — LIPID PANEL WITH LDL/HDL RATIO
Cholesterol, Total: 227 mg/dL — ABNORMAL HIGH (ref 100–199)
HDL: 57 mg/dL (ref >39)
LDL Chol Calc (NIH): 143 mg/dL — ABNORMAL HIGH (ref 0–99)
LDL/HDL Ratio: 2.5 ratio (ref 0.0–3.2)
Triglycerides: 151 mg/dL — ABNORMAL HIGH (ref 0–149)
VLDL Cholesterol Cal: 27 mg/dL (ref 5–40)

## 2020-11-07 LAB — TSH: TSH: 1.72 u[IU]/mL (ref 0.450–4.500)

## 2020-11-07 LAB — T3: T3, Total: 109 ng/dL (ref 71–180)

## 2020-11-07 LAB — HEMOGLOBIN A1C
Est. average glucose Bld gHb Est-mCnc: 117 mg/dL
Hgb A1c MFr Bld: 5.7 % — ABNORMAL HIGH (ref 4.8–5.6)

## 2020-11-07 LAB — T4, FREE: Free T4: 1.11 ng/dL (ref 0.82–1.77)

## 2020-11-07 LAB — VITAMIN B12: Vitamin B-12: 657 pg/mL (ref 232–1245)

## 2020-11-07 LAB — VITAMIN D 25 HYDROXY (VIT D DEFICIENCY, FRACTURES): Vit D, 25-Hydroxy: 21.4 ng/mL — ABNORMAL LOW (ref 30.0–100.0)

## 2020-11-07 LAB — INSULIN, RANDOM: INSULIN: 11.9 u[IU]/mL (ref 2.6–24.9)

## 2020-11-07 LAB — FOLATE: Folate: 7.3 ng/mL (ref >3.0)

## 2020-11-08 ENCOUNTER — Encounter (INDEPENDENT_AMBULATORY_CARE_PROVIDER_SITE_OTHER): Payer: Self-pay | Admitting: Family Medicine

## 2020-11-13 ENCOUNTER — Encounter (INDEPENDENT_AMBULATORY_CARE_PROVIDER_SITE_OTHER): Payer: Self-pay | Admitting: Family Medicine

## 2020-11-13 NOTE — Progress Notes (Signed)
Chief Complaint:   OBESITY Betty Porter (MR# [id number]) is a 50 y.o. female who presents for evaluation and treatment of obesity and related comorbidities. Current BMI is Body mass index is 33.63 kg/m. Betty Porter has been struggling with her weight for many years and has been unsuccessful in either losing weight, maintaining weight loss, or reaching her healthy weight goal.  Betty Porter is currently in the action stage of change and ready to dedicate time achieving and maintaining a healthier weight. Betty Porter is interested in becoming our patient and working on intensive lifestyle modifications including (but not limited to) diet and exercise for weight loss.  Betty Porter found the clinic through an online search. She is lactose intolerant- can't drink shakes or eat yogurt or cheese. 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 She has a 61 year old son who is a picky eater. BF- banana, yogurt + flaxseed (feels she ate) will grab something from kids BF- donut/cinnamon roll (hungry-feels slightly satisfied); Lunch- salad with protein 2 strips chicken + chips (portioned out from big bag) + fruit (feels satisfied); ~2:30-3PM- baby carrots + fruit from lunch; 3:30PM banana or snack [place] jerky, boiled eggs, chips (hungry); Dinner- rice (1 1/2 cup), stew- vegetables + 1/2 cup protein, lentils; After dinner- sweet (popcorn or cookies).  [name]'s habits were reviewed today and are as follows: Her family eats meals together, her desired weight loss is 28 lbs, she has been heavy most of her life, she started gaining weight after marriage, her heaviest weight ever was 180 pounds, she has significant food cravings issues, she snacks frequently in the evenings, she is frequently drinking liquids with calories, she frequently makes poor food choices, she has problems with excessive hunger, she frequently eats larger portions than normal, she has binge eating behaviors and she struggles  with emotional eating.  Depression Screen Betty Porter's Food and Mood (modified PHQ-9) score was 11.  Depression screen [facility] 2/9 11/06/2020  Decreased Interest 1  Down, Depressed, Hopeless 1  PHQ - 2 Score 2  Altered sleeping 0  Tired, decreased energy 3  Change in appetite 1  Feeling bad or failure about yourself  3  Trouble concentrating 0  Moving slowly or fidgety/restless 2  Suicidal thoughts 0  PHQ-9 Score 11  Difficult doing work/chores Not difficult at all   Subjective:   1. Other fatigue [name] admits to daytime somnolence and denies waking up still tired. Patent has a history of symptoms of morning headache. [name] generally gets 6 or 7 hours of sleep per night, and states that she has generally restful sleep. Snoring is present. Apneic episodes are not present. Epworth Sleepiness Score is 5. EKG normal sinus rhythm at 77 bpm.  2. SOB (shortness of breath) on exertion [name] notes increasing shortness of breath with exercising and seems to be worsening over time with weight gain. She notes getting out of breath sooner with activity than she used to. This has not gotten worse recently. [name] denies shortness of breath at rest or orthopnea. EKG normal sinus rhythm at 77 bpm.  3. Mixed hyperlipidemia Betty Porter has been on meds for years. She is currently on gemfibrozil.  4. Other specified hypothyroidism Betty Porter is on levothyroxine 75 mcg.  5. Other depression Betty Porter feels anxiety of eating without the need to eat. She finds herself eating due to boredom frequently.  6. At risk for heart disease Betty Porter is at a higher than average risk for cardiovascular disease due to obesity.  Assessment/Plan:   1. Other fatigue [name] does feel that her weight is causing her energy to be lower than it should be. Fatigue may be related to obesity, depression or many other causes. Labs will be ordered, and in the meanwhile, [name] will focus on self care including making healthy food  choices, increasing physical activity and focusing on stress reduction. Check labs today.  - EKG 12-Lead - CBC with Differential/Platelet - Comprehensive metabolic panel - Hemoglobin A1c - Insulin, random - Vitamin B12 - VITAMIN D 25 Hydroxy (Vit-D Deficiency, Fractures) - Folate  2. SOB (shortness of breath) on exertion [name] does not feel that she gets out of breath more easily that she used to when she exercises. Betty Porter's shortness of breath appears to be obesity related and exercise induced. She has agreed to work on weight loss and gradually increase exercise to treat her exercise induced shortness of breath. Will continue to monitor closely.  3. Mixed hyperlipidemia Cardiovascular risk and specific lipid/LDL goals reviewed.  We discussed several lifestyle modifications today and [name] will continue to work on diet, exercise and weight loss efforts. Orders and follow up as documented in patient record. Check labs today.  Counseling Intensive lifestyle modifications are the first line treatment for this issue. . Dietary changes: Increase soluble fiber. Decrease simple carbohydrates. . Exercise changes: Moderate to vigorous-intensity aerobic activity 150 minutes per week if tolerated. . Lipid-lowering medications: see documented in medical record. - Lipid Panel With LDL/HDL Ratio  4. Other specified hypothyroidism Patient with long-standing hypothyroidism, on levothyroxine therapy. She appears euthyroid. Orders and follow up as documented in patient record. Check labs today.  Counseling . Good thyroid control is important for overall health. Supratherapeutic thyroid levels are dangerous and will not improve weight loss results. . The correct way to take levothyroxine is fasting, with water, separated by at least 30 minutes from breakfast, and separated by more than 4 hours from calcium, iron, multivitamins, acid reflux medications (PPIs).   - T3 - T4, free - TSH  5. Other  depression Behavior modification techniques were discussed today to help Betty Porter deal with her emotional/non-hunger eating behaviors.  Orders and follow up as documented in patient record. Refer to Dr. Mallie Mussel.  6. At risk for heart disease Betty Porter was given approximately 15 minutes of coronary artery disease prevention counseling today. She is 50 y.o. female and has risk factors for heart disease including obesity. We discussed intensive lifestyle modifications today with an emphasis on specific weight loss instructions and strategies.   Repetitive spaced learning was employed today to elicit superior memory formation and behavioral change.  7. Class 1 obesity with serious comorbidity and body mass index (BMI) of 33.0 to 33.9 in adult, unspecified obesity type Betty Porter is currently in the action stage of change and her goal is to continue with weight loss efforts. I recommend Betty Porter begin the structured treatment plan as follows:  She has agreed to the Category 3 Plan.  Exercise goals: As is   Behavioral modification strategies: increasing lean protein intake, meal planning and cooking strategies, keeping healthy foods in the home and planning for success.  She was informed of the importance of frequent follow-up visits to maximize her success with intensive lifestyle modifications for her multiple health conditions. She was informed we would discuss her lab results at her next visit unless there is a critical issue that needs to be addressed sooner. Betty Porter agreed to keep her next visit at the agreed upon time to discuss these results.  Objective:   Blood pressure 103/69, pulse 69, temperature 97.9 F (36.6 C), temperature source Oral, height 5\' 1"  (1.549 m), weight 178 lb (80.7 kg), last menstrual period 11/05/2020, SpO2 98 %. Body mass index is 33.63 kg/m.  EKG: Normal sinus rhythm, rate 77.  Indirect Calorimeter completed today shows a VO2 of 239 and a REE of 1663.  Her calculated  basal metabolic rate is 2409 thus her basal metabolic rate is better than expected.  General: Cooperative, alert, well developed, in no acute distress. HEENT: Conjunctivae and lids unremarkable. Cardiovascular: Regular rhythm.  Lungs: Normal work of breathing. Neurologic: No focal deficits.   Lab Results  Component Value Date   CREATININE 0.89 11/06/2020   BUN 15 11/06/2020   NA 140 11/06/2020   K 4.5 11/06/2020   CL 104 11/06/2020   CO2 19 (L) 11/06/2020   Lab Results  Component Value Date   ALT 23 11/06/2020   AST 21 11/06/2020   ALKPHOS 88 11/06/2020   BILITOT 0.3 11/06/2020   Lab Results  Component Value Date   HGBA1C 5.7 (H) 11/06/2020   Lab Results  Component Value Date   INSULIN 11.9 11/06/2020   Lab Results  Component Value Date   TSH 1.720 11/06/2020   Lab Results  Component Value Date   CHOL 227 (H) 11/06/2020   HDL 57 11/06/2020   LDLCALC 143 (H) 11/06/2020   TRIG 151 (H) 11/06/2020   Lab Results  Component Value Date   WBC 6.1 11/06/2020   HGB 13.0 11/06/2020   HCT 38.8 11/06/2020   MCV 87 11/06/2020   PLT 299 11/06/2020    Attestation Statements:   Reviewed by clinician on day of visit: allergies, medications, problem list, medical history, surgical history, family history, social history, and previous encounter notes.   [name] Ceo, am acting as transcriptionist for Coralie Common, MD.  This is the patient's first visit at [facility]. The patient's NEW PATIENT PACKET was reviewed at length. Included in the packet: current and past health history, medications, allergies, ROS, gynecologic history (women only), surgical history, family history, social history, weight history, weight loss surgery history (for those that have had weight loss surgery), nutritional evaluation, mood and food questionnaire, PHQ9, Epworth questionnaire, sleep habits questionnaire, patient life and health improvement goals questionnaire. These  will all be scanned into the patient's chart under media.   During the visit, I independently reviewed the patient's EKG, bioimpedance scale results, and indirect calorimeter results. I used this information to tailor a meal plan for the patient that will help her to lose weight and will improve her obesity-related conditions going forward. I performed a medically necessary appropriate examination and/or evaluation. I discussed the assessment and treatment plan with the patient. The patient was provided an opportunity to ask questions and all were answered. The patient agreed with the plan and demonstrated an understanding of the instructions. Labs were ordered at this visit and will be reviewed at the next visit unless more critical results need to be addressed immediately. Clinical information was updated and documented in the EMR.   Time spent on visit including pre-visit chart review and post-visit care was 45 minutes.   A separate 15 minutes was spent on risk counseling (see above).  I have reviewed the above documentation for accuracy and completeness, and I agree with the above. - Jinny Blossom, MD

## 2020-11-20 ENCOUNTER — Ambulatory Visit (INDEPENDENT_AMBULATORY_CARE_PROVIDER_SITE_OTHER): Payer: BC Managed Care – PPO | Admitting: Family Medicine

## 2020-11-20 ENCOUNTER — Encounter (INDEPENDENT_AMBULATORY_CARE_PROVIDER_SITE_OTHER): Payer: Self-pay | Admitting: Family Medicine

## 2020-11-20 ENCOUNTER — Other Ambulatory Visit: Payer: Self-pay

## 2020-11-20 VITALS — BP 95/62 | HR 58 | Temp 97.9°F | Ht 61.0 in | Wt 175.0 lb

## 2020-11-20 DIAGNOSIS — E038 Other specified hypothyroidism: Secondary | ICD-10-CM | POA: Diagnosis not present

## 2020-11-20 DIAGNOSIS — E669 Obesity, unspecified: Secondary | ICD-10-CM

## 2020-11-20 DIAGNOSIS — R7303 Prediabetes: Secondary | ICD-10-CM

## 2020-11-20 DIAGNOSIS — E782 Mixed hyperlipidemia: Secondary | ICD-10-CM | POA: Diagnosis not present

## 2020-11-20 DIAGNOSIS — E559 Vitamin D deficiency, unspecified: Secondary | ICD-10-CM

## 2020-11-20 DIAGNOSIS — Z9189 Other specified personal risk factors, not elsewhere classified: Secondary | ICD-10-CM

## 2020-11-20 DIAGNOSIS — E66811 Obesity, class 1: Secondary | ICD-10-CM

## 2020-11-20 DIAGNOSIS — Z6833 Body mass index (BMI) 33.0-33.9, adult: Secondary | ICD-10-CM

## 2020-11-20 MED ORDER — VITAMIN D (ERGOCALCIFEROL) 1.25 MG (50000 UNIT) PO CAPS: 50000.0000 [IU] | ORAL_CAPSULE | ORAL | 0 refills | Status: DC

## 2020-11-20 MED ORDER — LEVOTHYROXINE SODIUM 75 MCG PO TABS: 75.0000 ug | ORAL_TABLET | Freq: Every day | ORAL | 0 refills | Status: DC

## 2020-11-21 ENCOUNTER — Telehealth (INDEPENDENT_AMBULATORY_CARE_PROVIDER_SITE_OTHER): Payer: BC Managed Care – PPO | Admitting: Psychology

## 2020-11-21 DIAGNOSIS — F5089 Other specified eating disorder: Secondary | ICD-10-CM | POA: Diagnosis not present
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
## 2020-11-23 NOTE — Progress Notes (Signed)
Chief Complaint:   OBESITY Betty Porter is here to discuss her progress with her obesity treatment plan along with follow-up of her obesity related diagnoses. Betty Porter is on the Category 3 Plan and states she is following her eating plan approximately 100% of the time. Betty Porter states she is crossfit 60 minutes 3-4 times per week.  Today's visit was #: 2 Starting weight: 178 lbs Starting date: 11/06/2020 Today's weight: 175 lbs Today's date: 11/20/2020 Total lbs lost to date: 3 Total lbs lost since last in-office visit: 3  Interim History: Betty Porter is disappointed with meal plan. She doesn't eat a lot of vegetables so she has been doing a lot of the same things over and over. She is getting hungry after the 1 egg she eats at home and before finishing her breakfast. She eats lunch at 11 AM and is doing salad with 4 oz meat, then apple a bit later, then pt is very hungry around 3:45 PM, and she eats cottage cheese/Greek yogurt. Pt is getting in 8-10 oz at dinner and 2 cups; doing frozen yogurt bar and carrots or celery or jello for snack. She is doing canned green beans, tomato, cilantro, onion, artichoke, carrot, celery, and zucchini.  Subjective:   1. Mixed hyperlipidemia Discussed labs with patient today. Betty Porter's LDL 143, HDL 57, triglycerides 151, and total 227. She is taking gemfibrozil. Her 10 year ASVCD risk score is 0.8%.  2. Vitamin D deficiency New. Discussed labs with patient today. Betty Porter has a Vit D level of 21.4. She reports fatigue. She is not on a Vit D supplement.  3. Other specified hypothyroidism Discussed labs with patient today. Betty Porter's TSH is 1.720 and T3 and T4 are WNL. She is on levothyroxine 75 mcg. Pt denies cold or hot intolerances or palpitations.  4. Pre-diabetes New. Discussed labs with patient today. Betty Porter has an A1c of 5.7 and insulin level 11.9. She reports this is a new diagnosis.  5. At risk for diabetes mellitus Betty Porter is at higher than average  risk for developing diabetes due to obesity.   Assessment/Plan:   1. Mixed hyperlipidemia Cardiovascular risk and specific lipid/LDL goals reviewed.  We discussed several lifestyle modifications today and Japji will continue to work on diet, exercise and weight loss efforts. Orders and follow up as documented in patient record. Follow labs in 3 months. Continue meds and lifestyle changes only.  Counseling Intensive lifestyle modifications are the first line treatment for this issue. . Dietary changes: Increase soluble fiber. Decrease simple carbohydrates. . Exercise changes: Moderate to vigorous-intensity aerobic activity 150 minutes per week if tolerated. . Lipid-lowering medications: see documented in medical record.  2. Vitamin D deficiency Low Vitamin D level contributes to fatigue and are associated with obesity, breast, and colon cancer. She agrees to start to take prescription Vitamin D @50 ,000 IU every week and will follow-up for routine testing of Vitamin D, at least 2-3 times per year to avoid over-replacement. - Vitamin D, Ergocalciferol, (DRISDOL) 1.25 MG (50000 UNIT) CAPS capsule; Take 1 capsule (50,000 Units total) by mouth every 7 (seven) days.  Dispense: 4 capsule; Refill: 0  3. Other specified hypothyroidism Patient with long-standing hypothyroidism, on levothyroxine therapy. She appears euthyroid. Orders and follow up as documented in patient record.  Counseling . Good thyroid control is important for overall health. Supratherapeutic thyroid levels are dangerous and will not improve weight loss results. . The correct way to take levothyroxine is fasting, with water, separated by at least 30 minutes from  breakfast, and separated by more than 4 hours from calcium, iron, multivitamins, acid reflux medications (PPIs).   - levothyroxine (SYNTHROID) 75 MCG tablet; Take 1 tablet (75 mcg total) by mouth daily before breakfast.  Dispense: 90 tablet; Refill: 0  4.  Pre-diabetes Betty Porter will continue to work on weight loss, exercise, and decreasing simple carbohydrates to help decrease the risk of diabetes. Continue category 3 with medication at this time.  5. At risk for diabetes mellitus Betty Porter was given approximately 30 minutes of diabetes education and counseling today. We discussed intensive lifestyle modifications today with an emphasis on weight loss as well as increasing exercise and decreasing simple carbohydrates in her diet. We also reviewed medication options with an emphasis on risk versus benefit of those discussed.   Repetitive spaced learning was employed today to elicit superior memory formation and behavioral change.  6. Class 1 obesity with serious comorbidity and body mass index (BMI) of 33.0 to 33.9 in adult, unspecified obesity type Betty Porter is currently in the action stage of change. As such, her goal is to continue with weight loss efforts. She has agreed to the Category 3 Plan + 100 calories.   Pt is to add protein from dinner to lunch.  Exercise goals: As is  Behavioral modification strategies: increasing lean protein intake, meal planning and cooking strategies, keeping healthy foods in the home and planning for success.  [place] has agreed to follow-up with our clinic in 2-3 weeks. She was informed of the importance of frequent follow-up visits to maximize her success with intensive lifestyle modifications for her multiple health conditions.   Objective:   Blood pressure 95/62, pulse (!) 58, temperature 97.9 F (36.6 C), height 5\' 1"  (1.549 m), weight 175 lb (79.4 kg), last menstrual period 11/05/2020, SpO2 98 %. Body mass index is 33.07 kg/m.  General: Cooperative, alert, well developed, in no acute distress. HEENT: Conjunctivae and lids unremarkable. Cardiovascular: Regular rhythm.  Lungs: Normal work of breathing. Neurologic: No focal deficits.   Lab Results  Component Value Date   CREATININE 0.89 11/06/2020    BUN 15 11/06/2020   NA 140 11/06/2020   K 4.5 11/06/2020   CL 104 11/06/2020   CO2 19 (L) 11/06/2020   Lab Results  Component Value Date   ALT 23 11/06/2020   AST 21 11/06/2020   ALKPHOS 88 11/06/2020   BILITOT 0.3 11/06/2020   Lab Results  Component Value Date   HGBA1C 5.7 (H) 11/06/2020   Lab Results  Component Value Date   INSULIN 11.9 11/06/2020   Lab Results  Component Value Date   TSH 1.720 11/06/2020   Lab Results  Component Value Date   CHOL 227 (H) 11/06/2020   HDL 57 11/06/2020   LDLCALC 143 (H) 11/06/2020   TRIG 151 (H) 11/06/2020   Lab Results  Component Value Date   WBC 6.1 11/06/2020   HGB 13.0 11/06/2020   HCT 38.8 11/06/2020   MCV 87 11/06/2020   PLT 299 11/06/2020    Attestation Statements:   Reviewed by clinician on day of visit: allergies, medications, problem list, medical history, surgical history, family history, social history, and previous encounter notes.  [name] Ceo, am acting as transcriptionist for Coralie Common, MD.   I have reviewed the above documentation for accuracy and completeness, and I agree with the above. - Jinny Blossom, MD

## 2020-11-28 NOTE — Progress Notes (Signed)
Office: [phone]  /  Fax: [phone]    Date: Dec 12, 2020   Appointment Start Time: 4:04pm Duration: 36 minutes Provider: Glennie Isle, Psy.D. Type of Session: Individual Therapy  Location of Patient: Home Location of Provider: Provider's Home (private office) Type of Contact: Telepsychological Visit via MyChart Video Visit  Session Content: This provider called Betty Porter at 4:02pm as she did not present for the telepsychological appointment. Assistance on connecting was provided. As such, today's appointment was initiated 4 minutes late. Betty Porter is a 50 y.o. female presenting for a follow-up appointment to address the previously established treatment goal of increasing coping skills. Today's appointment was a telepsychological visit due to COVID-19. Betty Porter provided verbal consent for today's telepsychological appointment and she is aware she is responsible for securing confidentiality on her end of the session. Prior to proceeding with today's appointment, Betty Porter's physical location at the time of this appointment was obtained as well a phone number she could be reached at in the event of technical difficulties. Betty Porter and this provider participated in today's telepsychological service.   This provider conducted a brief check-in. Betty Porter shared she eats when she is not hungry, "especially after dinner." She acknowledged she typically craves sweets, adding she is saving snack calories to have a snack after dinner. She also reported some deviations from the meal plan over the weekend as she ate out and had meals with her family. Notably, she described making better choices and engaging in portion control. Positive reinforcement was provided. Betty Porter stated she continues to weigh herself at home. Consequences of weighing regularly (i.e., possibly skewing eating habits) were discussed. She stated it is "hard" to just stop, but she was receptive to weighing every other day for the next week and  further reducing frequency from there. Psychoeducation regarding triggers for emotional eating was provided. Betty Porter was provided a handout, and encouraged to utilize the handout between now and the next appointment to increase awareness of triggers and frequency. Betty Porter agreed. This provider also discussed behavioral strategies for specific triggers, such as placing the utensil down when conversing to avoid mindless eating. Betty Porter provided verbal consent during today's appointment for this provider to send a handout about triggers via e-mail. She was receptive to further exploring triggers for emotional eating as evidenced by her wondering if her husband being home more will impact her eating. She disclosed in the past he would sometimes get angry at situations and yell (not at anyone in particular; no cursing; no physical outbursts; e.g., "Why is that room over there?"). She denied any safety/abuse concerns for self and her son, adding, "We wouldn't be together [if there was concern for abuse]."  Betty Porter was receptive to today's appointment as evidenced by openness to sharing, responsiveness to feedback, and willingness to explore triggers for emotional eating.  Mental Status Examination:  Appearance: well groomed and appropriate hygiene  Behavior: appropriate to circumstances Mood: euthymic Affect: mood congruent Speech: normal in rate, volume, and tone Eye Contact: appropriate Psychomotor Activity: unable to assess  Gait: unable to assess Thought Process: linear, logical, and goal directed  Thought Content/Perception: no hallucinations, delusions, bizarre thinking or behavior reported or observed and no evidence or endorsement of suicidal and homicidal ideation, plan, and intent Orientation: time, person, place, and purpose of appointment Memory/Concentration: memory, attention, language, and fund of knowledge intact  Insight/Judgment: fair  Interventions:  Conducted a brief chart  review Provided empathic reflections and validation Reviewed content from the previous session Employed supportive psychotherapy interventions to facilitate reduced  distress and to improve coping skills with identified stressors Psychoeducation provided regarding triggers for emotional eating  DSM-5 Diagnosis(es): 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 Progress is limited, as Betty Porter has just begun treatment with this provider; however, she is receptive to the interaction and interventions and rapport is being established.   Plan: The next appointment will be scheduled in approximately three weeks, which will be via MyChart Video Visit. The next session will focus on working towards the established treatment goal.

## 2020-12-06 ENCOUNTER — Encounter (INDEPENDENT_AMBULATORY_CARE_PROVIDER_SITE_OTHER): Payer: Self-pay | Admitting: Family Medicine

## 2020-12-11 ENCOUNTER — Ambulatory Visit (INDEPENDENT_AMBULATORY_CARE_PROVIDER_SITE_OTHER): Payer: BC Managed Care – PPO | Admitting: Physician Assistant

## 2020-12-12 ENCOUNTER — Telehealth (INDEPENDENT_AMBULATORY_CARE_PROVIDER_SITE_OTHER): Payer: BC Managed Care – PPO | Admitting: Psychology

## 2020-12-12 ENCOUNTER — Other Ambulatory Visit: Payer: Self-pay

## 2020-12-12 DIAGNOSIS — F5089 Other specified eating disorder: Secondary | ICD-10-CM | POA: Diagnosis not present

## 2020-12-13 ENCOUNTER — Other Ambulatory Visit: Payer: Self-pay

## 2020-12-17 ENCOUNTER — Other Ambulatory Visit: Payer: Self-pay

## 2020-12-17 ENCOUNTER — Encounter (INDEPENDENT_AMBULATORY_CARE_PROVIDER_SITE_OTHER): Payer: Self-pay | Admitting: Family Medicine

## 2020-12-17 ENCOUNTER — Ambulatory Visit (INDEPENDENT_AMBULATORY_CARE_PROVIDER_SITE_OTHER): Payer: BC Managed Care – PPO | Admitting: Family Medicine

## 2020-12-17 VITALS — BP 106/68 | HR 56 | Temp 98.1°F | Ht 61.0 in | Wt 172.0 lb

## 2020-12-17 DIAGNOSIS — Z6833 Body mass index (BMI) 33.0-33.9, adult: Secondary | ICD-10-CM | POA: Diagnosis not present

## 2020-12-17 DIAGNOSIS — E559 Vitamin D deficiency, unspecified: Secondary | ICD-10-CM

## 2020-12-17 DIAGNOSIS — Z9189 Other specified personal risk factors, not elsewhere classified: Secondary | ICD-10-CM | POA: Diagnosis not present

## 2020-12-17 DIAGNOSIS — E669 Obesity, unspecified: Secondary | ICD-10-CM | POA: Diagnosis not present

## 2020-12-17 MED ORDER — VITAMIN D (ERGOCALCIFEROL) 1.25 MG (50000 UNIT) PO CAPS: 50000.0000 [IU] | ORAL_CAPSULE | ORAL | 0 refills | Status: DC

## 2020-12-17 NOTE — Progress Notes (Signed)
  Office: [phone]  /  Fax: [phone]    Date: Dec 31, 2020   Appointment Start Time: 4:00pm Duration: 25 minutes Provider: Glennie Isle, Psy.D. Type of Session: Individual Therapy  Location of Patient: Home Location of Provider: Provider's Home (private office) Type of Contact: Telepsychological Visit via MyChart Video Visit  Session Content: Betty Porter is a 50 y.o. female presenting for a follow-up appointment to address the previously established treatment goal of increasing coping skills. Today's appointment was a telepsychological visit due to COVID-19. Betty Porter provided verbal consent for today's telepsychological appointment and she is aware she is responsible for securing confidentiality on her end of the session. Prior to proceeding with today's appointment, Betty Porter's physical location at the time of this appointment was obtained as well a phone number she could be reached at in the event of technical difficulties. Betty Porter and this provider participated in today's telepsychological service.   This provider conducted a brief check-in. Betty Porter stated it is easier to follow the plan when working, adding it is "hard on the weekends." This was further explored and processed. She was engaged in problem solving. Additionally, psychoeducation regarding pleasurable activities, including its impact on emotional eating and overall well-being was provided. Betty Porter was provided with a handout with various options of pleasurable activities, and was encouraged to engage in one activity a day and additional activities as needed when triggered to emotionally eat. Betty Porter agreed. Betty Porter provided verbal consent during today's appointment for this provider to send a handout with pleasurable activities via e-mail. Overall, Betty Porter was receptive to today's appointment as evidenced by openness to sharing, responsiveness to feedback, and willingness to implement discussed strategies .  Mental Status  Examination:  Appearance: well groomed and appropriate hygiene  Behavior: appropriate to circumstances Mood: euthymic Affect: mood congruent Speech: normal in rate, volume, and tone Eye Contact: appropriate Psychomotor Activity: unable to assess  Gait: unable to assess Thought Process: linear, logical, and goal directed  Thought Content/Perception: no hallucinations, delusions, bizarre thinking or behavior reported or observed and no evidence or endorsement of suicidal and homicidal ideation, plan, and intent Orientation: time, person, place, and purpose of appointment Memory/Concentration: memory, attention, language, and fund of knowledge intact  Insight/Judgment: good  Interventions:  Conducted a brief chart review Provided empathic reflections and validation Employed supportive psychotherapy interventions to facilitate reduced distress and to improve coping skills with identified stressors Engaged patient in problem solving Psychoeducation provided regarding pleasurable activities  DSM-5 Diagnosis(es): 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 Betty Porter has demonstrated progress in her goal as evidenced by increased awareness of hunger patterns and increased awareness of triggers for emotional eating. Betty Porter also continues to demonstrate willingness to engage in learned skill(s).  Plan: Due to Betty Porter's work schedule and upcoming vacation, the next appointment will be scheduled in five weeks, which will be via [place] Visit. The next session will focus on working towards the established treatment goal.

## 2020-12-18 DIAGNOSIS — E559 Vitamin D deficiency, unspecified: Secondary | ICD-10-CM | POA: Insufficient documentation

## 2020-12-18 DIAGNOSIS — Z9189 Other specified personal risk factors, not elsewhere classified: Secondary | ICD-10-CM | POA: Insufficient documentation

## 2020-12-25 NOTE — Progress Notes (Signed)
Chief Complaint:   OBESITY Betty Porter is here to discuss her progress with her obesity treatment plan along with follow-up of her obesity related diagnoses.   Today's visit was #: 3 Starting weight: 178 lbs Starting date: 11/06/2020 Today's weight: 172 lbs Today's date: 12/17/2020 Weight change since last visit: 3 lbs Total lbs lost to date: 6 lbs Body mass index is 32.5 kg/m.  Total weight loss percentage to date: -3.37%  Interim History:  This is Betty Porter's first office visit with me.  She was seen prior by Dr. Jearld Shines.  She says that overall the plan is going well, but has questions about certain foods and timing.  She eats breakfast at 8:30 am, lunch at 11:20 am.  She has increased hunger in the afternoon when she skips 1 cup of [employer] milk and bread in the mornings.  No cravings.  Questions about snacks.  Current Meal Plan: the Category 3 Plan +100 calories for 90% of the time.  Current Exercise Plan: CrossFit for 60 minutes 3-4 times per week.  Assessment/Plan:   No orders of the defined types were placed in this encounter.   Medications Discontinued During This Encounter  Medication Reason  . Vitamin D, Ergocalciferol, (DRISDOL) 1.25 MG (50000 UNIT) CAPS capsule Reorder     Meds ordered this encounter  Medications  . Vitamin D, Ergocalciferol, (DRISDOL) 1.25 MG (50000 UNIT) CAPS capsule    Sig: Take 1 capsule (50,000 Units total) by mouth every 7 (seven) days.    Dispense:  4 capsule    Refill:  0    1. Vitamin D deficiency Not at goal. Current vitamin D is 21.4, tested on 11/06/2020. Optimal goal > 50 ng/dL.  She is taking vitamin D 50,000 IU weekly.  Plan: Continue to take prescription Vitamin D @50 ,000 IU every week as prescribed.  Follow-up for routine testing of Vitamin D, at least 2-3 times per year to avoid over-replacement.  - Vitamin D, Ergocalciferol, (DRISDOL) 1.25 MG (50000 UNIT) CAPS capsule; Take 1 capsule (50,000 Units total) by mouth every 7 (seven)  days.  Dispense: 4 capsule; Refill: 0  2. At risk for deficient intake of food Betty Porter was given extensive education and counseling today of more than 8 minutes on risks associated with deficient food intake.  Counseled her on the importance of following our prescribed meal plan and eating adequate amounts of protein.  Discussed with Betty Porter that inadequate food intake over longer periods of time can slow their metabolism down significantly.   3. Obesity, current BMI 32.5  Course: Betty Porter is currently in the action stage of change. As such, her goal is to continue with weight loss efforts.   Nutrition goals: She has agreed to the Category 3 Plan but with 6 ounces of protein at lunch plus an extra 100 calories of a 10:1 ratio item with workouts (pre or post workout).   Exercise goals: As is.  Behavioral modification strategies: increasing lean protein intake, increasing water intake, no skipping meals/foods, better snacking choices and planning for success.  Betty Porter has agreed to follow-up with our clinic in 2-3 weeks. She was informed of the importance of frequent follow-up visits to maximize her success with intensive lifestyle modifications for her multiple health conditions.   Objective:   Blood pressure 106/68, pulse (!) 56, temperature 98.1 F (36.7 C), height 5\' 1"  (1.549 m), weight 172 lb (78 kg), SpO2 98 %. Body mass index is 32.5 kg/m.  General: Cooperative, alert, well developed,  in no acute distress. HEENT: Conjunctivae and lids unremarkable. Cardiovascular: Regular rhythm.  Lungs: Normal work of breathing. Neurologic: No focal deficits.   Lab Results  Component Value Date   CREATININE 0.89 11/06/2020   BUN 15 11/06/2020   NA 140 11/06/2020   K 4.5 11/06/2020   CL 104 11/06/2020   CO2 19 (L) 11/06/2020   Lab Results  Component Value Date   ALT 23 11/06/2020   AST 21 11/06/2020   ALKPHOS 88 11/06/2020   BILITOT 0.3 11/06/2020   Lab Results   Component Value Date   HGBA1C 5.7 (H) 11/06/2020   Lab Results  Component Value Date   INSULIN 11.9 11/06/2020   Lab Results  Component Value Date   TSH 1.720 11/06/2020   Lab Results  Component Value Date   CHOL 227 (H) 11/06/2020   HDL 57 11/06/2020   LDLCALC 143 (H) 11/06/2020   TRIG 151 (H) 11/06/2020   Lab Results  Component Value Date   WBC 6.1 11/06/2020   HGB 13.0 11/06/2020   HCT 38.8 11/06/2020   MCV 87 11/06/2020   PLT 299 11/06/2020   Attestation Statements:   Reviewed by clinician on day of visit: allergies, medications, problem list, medical history, surgical history, family history, social history, and previous encounter notes.  I, Water quality scientist, CMA, am acting as Location manager for [employer], DO.  I have reviewed the above documentation for accuracy and completeness, and I agree with the above. Marjory Sneddon, D.O.  The [place] was signed into law in 2016 which includes the topic of electronic health records.  This provides immediate access to information in MyChart.  This includes consultation notes, operative notes, office notes, lab results and pathology reports.  If you have any questions about what you read please let us know at your next visit so we can discuss your concerns and take corrective action if need be.  We are right here with you.

## 2020-12-31 ENCOUNTER — Telehealth (INDEPENDENT_AMBULATORY_CARE_PROVIDER_SITE_OTHER): Payer: BC Managed Care – PPO | Admitting: Psychology

## 2020-12-31 DIAGNOSIS — F5089 Other specified eating disorder: Secondary | ICD-10-CM | POA: Diagnosis not present

## 2021-01-02 ENCOUNTER — Other Ambulatory Visit: Payer: Self-pay

## 2021-01-02 ENCOUNTER — Encounter (INDEPENDENT_AMBULATORY_CARE_PROVIDER_SITE_OTHER): Payer: Self-pay | Admitting: Physician Assistant

## 2021-01-02 ENCOUNTER — Ambulatory Visit (INDEPENDENT_AMBULATORY_CARE_PROVIDER_SITE_OTHER): Payer: BC Managed Care – PPO | Admitting: Physician Assistant

## 2021-01-02 VITALS — BP 100/64 | HR 57 | Temp 98.0°F | Ht 61.0 in | Wt 170.0 lb

## 2021-01-02 DIAGNOSIS — Z6832 Body mass index (BMI) 32.0-32.9, adult: Secondary | ICD-10-CM | POA: Diagnosis not present

## 2021-01-02 DIAGNOSIS — R7303 Prediabetes: Secondary | ICD-10-CM | POA: Diagnosis not present

## 2021-01-03 NOTE — Progress Notes (Signed)
     Chief Complaint:   OBESITY Betty Porter is here to discuss her progress with her obesity treatment plan along with follow-up of her obesity related diagnoses. Betty Porter is on the Category 3 Plan and states she is following her eating plan approximately 90% of the time. Clovis states she is doing Crossfit for 60 minutes 4 times per week.  Today's visit was #: 4 Starting weight: 178 lbs Starting date: 11/06/2020 Today's weight: 170 lbs Today's date: 01/02/2021 Total lbs lost to date: 8 Total lbs lost since last in-office visit: 2  Interim History: Betty Porter did well with weight loss. Sometimes she doesn't get in all of her snacks and she is skipping her yogurt (which she is substituting for her milk) at breakfast. She likes sweet snacks after dinner.  Subjective:   1. Pre-diabetes Betty Porter's last A1c was 5.7, and she is not on medications. She denies polyphagia when she is on the plan.  Assessment/Plan:   1. Pre-diabetes Betty Porter will continue her meal plan, exercise, and decreasing simple carbohydrates to help decrease the risk of diabetes.   2. BMI 32.0-32.9,adult Betty Porter is currently in the action stage of change. As such, her goal is to continue with weight loss efforts. She has agreed to the Category 3 Plan.   Exercise goals: As is.  Behavioral modification strategies: meal planning and cooking strategies and better snacking choices.  Betty Porter has agreed to follow-up with our clinic in 2 weeks. She was informed of the importance of frequent follow-up visits to maximize her success with intensive lifestyle modifications for her multiple health conditions.   Objective:   Blood pressure 100/64, pulse (!) 57, temperature 98 F (36.7 C), height 5\' 1"  (1.549 m), weight 170 lb (77.1 kg), SpO2 99 %. Body mass index is 32.12 kg/m.  General: Cooperative, alert, well developed, in no acute distress. HEENT: Conjunctivae and lids unremarkable. Cardiovascular: Regular rhythm.  Lungs:  Normal work of breathing. Neurologic: No focal deficits.   Lab Results  Component Value Date   CREATININE 0.89 11/06/2020   BUN 15 11/06/2020   NA 140 11/06/2020   K 4.5 11/06/2020   CL 104 11/06/2020   CO2 19 (L) 11/06/2020   Lab Results  Component Value Date   ALT 23 11/06/2020   AST 21 11/06/2020   ALKPHOS 88 11/06/2020   BILITOT 0.3 11/06/2020   Lab Results  Component Value Date   HGBA1C 5.7 (H) 11/06/2020   Lab Results  Component Value Date   INSULIN 11.9 11/06/2020   Lab Results  Component Value Date   TSH 1.720 11/06/2020   Lab Results  Component Value Date   CHOL 227 (H) 11/06/2020   HDL 57 11/06/2020   LDLCALC 143 (H) 11/06/2020   TRIG 151 (H) 11/06/2020   Lab Results  Component Value Date   WBC 6.1 11/06/2020   HGB 13.0 11/06/2020   HCT 38.8 11/06/2020   MCV 87 11/06/2020   PLT 299 11/06/2020   No results found for: IRON, TIBC, FERRITIN  Attestation Statements:   Reviewed by clinician on day of visit: allergies, medications, problem list, medical history, surgical history, family history, social history, and previous encounter notes.  Time spent on visit including pre-visit chart review and post-visit care and charting was 30 minutes.    Wilhemena Durie, am acting as transcriptionist for [employer], PA-C.  I have reviewed the above documentation for accuracy and completeness, and I agree with the above. -  *Abby Potash, PA-C

## 2021-01-10 ENCOUNTER — Encounter (INDEPENDENT_AMBULATORY_CARE_PROVIDER_SITE_OTHER): Payer: Self-pay

## 2021-01-14 ENCOUNTER — Ambulatory Visit (INDEPENDENT_AMBULATORY_CARE_PROVIDER_SITE_OTHER): Payer: BC Managed Care – PPO | Admitting: Family Medicine

## 2021-01-16 ENCOUNTER — Ambulatory Visit (INDEPENDENT_AMBULATORY_CARE_PROVIDER_SITE_OTHER): Payer: BC Managed Care – PPO | Admitting: Family Medicine

## 2021-01-16 ENCOUNTER — Encounter (INDEPENDENT_AMBULATORY_CARE_PROVIDER_SITE_OTHER): Payer: Self-pay | Admitting: Family Medicine

## 2021-01-16 ENCOUNTER — Other Ambulatory Visit: Payer: Self-pay

## 2021-01-16 VITALS — BP 102/66 | HR 67 | Temp 98.1°F | Ht 61.0 in | Wt 170.0 lb

## 2021-01-16 DIAGNOSIS — E038 Other specified hypothyroidism: Secondary | ICD-10-CM

## 2021-01-16 DIAGNOSIS — E782 Mixed hyperlipidemia: Secondary | ICD-10-CM | POA: Diagnosis not present

## 2021-01-16 DIAGNOSIS — Z6833 Body mass index (BMI) 33.0-33.9, adult: Secondary | ICD-10-CM

## 2021-01-16 DIAGNOSIS — Z9189 Other specified personal risk factors, not elsewhere classified: Secondary | ICD-10-CM | POA: Diagnosis not present

## 2021-01-16 DIAGNOSIS — E669 Obesity, unspecified: Secondary | ICD-10-CM

## 2021-01-16 MED ORDER — GEMFIBROZIL 600 MG PO TABS: 600.0000 mg | ORAL_TABLET | Freq: Two times a day (BID) | ORAL | 0 refills | Status: DC

## 2021-01-16 MED ORDER — LEVOTHYROXINE SODIUM 75 MCG PO TABS: 75.0000 ug | ORAL_TABLET | Freq: Every day | ORAL | 0 refills | Status: DC

## 2021-01-21 NOTE — Progress Notes (Signed)
  Office: [phone]  /  Fax: [phone]    Date: February 04, 2021   Appointment Start Time: 10:01am Duration: 27 minutes Provider: Glennie Isle, Psy.D. Type of Session: Individual Therapy  Location of Patient: Home Location of Provider: Provider's home (private office) Type of Contact: Telepsychological Visit via MyChart Video Visit  Session Content: Mirayah is a 50 y.o. female presenting for a follow-up appointment to address the previously established treatment goal of increasing coping skills. Today's appointment was a telepsychological visit due to COVID-19. Aeriel provided verbal consent for today's telepsychological appointment and she is aware she is responsible for securing confidentiality on her end of the session. Prior to proceeding with today's appointment, Tessie's physical location at the time of this appointment was obtained as well a phone number she could be reached at in the event of technical difficulties. Nakyia and this provider participated in today's telepsychological service.   This provider conducted a brief check-in. Rosalba shared about her recent vacation to [place], noting she made better choices and engaged in portion control. She acknowledged some challenges with desserts at the end of the vacation. Reviewed pleasurable activities. Katieann stated, "I practiced some of it and it really worked." She recalled engaging in different activities to avoid engaging in emotional eating behaviors. Positive reinforcement was provided. Challenges during vacation were further explored and processed. Out of habit trigger for emotional eating behaviors was reviewed. To further assist with coping, psychoeducation provided regarding grounding techniques. Engaged Khristian in a grounding technique (5-4-3-2-1). Emalyn provided verbal consent during today's appointment for this provider to send a handout with today's exercise via e-mail. Furthermore, termination planning was discussed.  Rhodesia was receptive to a follow-up appointment in 3-4 weeks and an additional follow-up/termination appointment in 3-4 weeks after that. Rickesha was receptive to today's appointment as evidenced by openness to sharing, responsiveness to feedback, and willingness to engage in learned skills.  Mental Status Examination:  Appearance: well groomed and appropriate hygiene  Behavior: appropriate to circumstances Mood: euthymic Affect: mood congruent Speech: normal in rate, volume, and tone Eye Contact: appropriate Psychomotor Activity: unable to assess  Gait: unable to assess Thought Process: linear, logical, and goal directed  Thought Content/Perception: no hallucinations, delusions, bizarre thinking or behavior reported or observed and no evidence or endorsement of suicidal and homicidal ideation, plan, and intent Orientation: time, person, place, and purpose of appointment Memory/Concentration: memory, attention, language, and fund of knowledge intact  Insight/Judgment: good  Interventions:  Conducted a brief chart review Provided empathic reflections and validation Reviewed content from the previous session Provided positive reinforcement Employed supportive psychotherapy interventions to facilitate reduced distress and to improve coping skills with identified stressors Discussed termination planning Psychoeducation provided regarding grounding techniques Engaged patient in grounding technique   DSM-5 Diagnosis(es): 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 Chere has demonstrated progress in her goal as evidenced by increased awareness of hunger patterns, increased awareness of triggers for emotional eating behaviors, and reduction in emotional eating behaviors . Damya also continues to demonstrate  willingness to engage in learned skill(s).  Plan: Based on progress to date per Arta's self-report, the next appointment will be scheduled in 3-4 weeks, which will be via MyChart Video Visit. The next session will focus on working towards the established treatment goal.

## 2021-01-24 NOTE — Progress Notes (Signed)
Chief Complaint:   OBESITY Betty Porter is here to discuss her progress with her obesity treatment plan along with follow-up of her obesity related diagnoses. Betty Porter is on the Category 3 Plan and states she is following her eating plan approximately 60% of the time. Betty Porter states she is doing Crossfit for 60 minutes 4 times per week.  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 She has done well maintaining her weight loss despite the extra temptations. She will be traveling soon, and she is open to discussing travel strategies.  Subjective:   1. Other specified hypothyroidism Betty Porter is stable on Levothyroxine. She requests a refill today.  2. Mixed hyperlipidemia Betty Porter is doing very well with diet and exercise. She requests a refill of Lopid today.  3. At risk for heart disease Betty Porter is at a higher than average risk for cardiovascular disease due to obesity.   Assessment/Plan:   1. Other specified hypothyroidism Betty Porter will continue her medications, and we will refill levothyroxine for 1 month. Orders and follow up as documented in patient record.  - levothyroxine (SYNTHROID) 75 MCG tablet; Take 1 tablet (75 mcg total) by mouth daily before breakfast.  Dispense: 30 tablet; Refill: 0  2. Mixed hyperlipidemia Cardiovascular risk and specific lipid/LDL goals reviewed. We discussed several lifestyle modifications today. We will refill Lopid or 1 month. Betty Porter will continue to work on diet, exercise and weight loss efforts. Orders and follow up as documented in patient record.   - gemfibrozil (LOPID) 600 MG tablet; Take 1 tablet (600 mg total) by mouth in the morning and at bedtime.  Dispense:  30 tablet; Refill: 0  3. At risk for heart disease Betty Porter was given approximately 15 minutes of coronary artery disease prevention counseling today. She is 49 y.o. female and has risk factors for heart disease including obesity. We discussed intensive lifestyle modifications today with an emphasis on specific weight loss instructions and strategies.   Repetitive spaced learning was employed today to elicit superior memory formation and behavioral change.  4. Obesity witrh current BMI 32.2 Betty Porter is currently in the action stage of change. As such, her goal is to continue with weight loss efforts. She has agreed to the Category 3 Plan.   Exercise goals: As is.  Behavioral modification strategies: increasing lean protein intake and travel eating strategies.  Betty Porter has agreed to follow-up with our clinic in 2 to 3 weeks. She was informed of the importance of frequent follow-up visits to maximize her success with intensive lifestyle modifications for her multiple health conditions.   Objective:   Blood pressure 102/66, pulse 67, temperature 98.1 F (36.7 C), height 5\' 1"  (1.549 m), weight 170 lb (77.1 kg), SpO2 97 %. Body mass index is 32.12 kg/m.  General: Cooperative, alert, well developed, in no acute distress. HEENT: Conjunctivae and lids unremarkable. Cardiovascular: Regular rhythm.  Lungs: Normal work of breathing. Neurologic: No focal deficits.   Lab Results  Component Value Date   CREATININE 0.89 11/06/2020   BUN 15 11/06/2020   NA 140 11/06/2020   K 4.5 11/06/2020   CL 104 11/06/2020   CO2 19 (L) 11/06/2020   Lab Results  Component Value Date   ALT 23  11/06/2020   AST 21 11/06/2020   ALKPHOS 88 11/06/2020   BILITOT 0.3 11/06/2020   Lab Results  Component Value Date   HGBA1C 5.7 (H) 11/06/2020   Lab Results  Component Value Date   INSULIN 11.9 11/06/2020   Lab Results  Component Value Date   TSH 1.720 11/06/2020   Lab Results  Component Value Date    CHOL 227 (H) 11/06/2020   HDL 57 11/06/2020   LDLCALC 143 (H) 11/06/2020   TRIG 151 (H) 11/06/2020   Lab Results  Component Value Date   WBC 6.1 11/06/2020   HGB 13.0 11/06/2020   HCT 38.8 11/06/2020   MCV 87 11/06/2020   PLT 299 11/06/2020   No results found for: IRON, TIBC, FERRITIN  Attestation Statements:   Reviewed by clinician on day of visit: allergies, medications, problem list, medical history, surgical history, family history, social history, and previous encounter notes.   I, Trixie Dredge, am acting as transcriptionist for Dennard Nip, MD.  I have reviewed the above documentation for accuracy and completeness, and I agree with the above. -  Dennard Nip, MD

## 2021-02-01 ENCOUNTER — Other Ambulatory Visit (INDEPENDENT_AMBULATORY_CARE_PROVIDER_SITE_OTHER): Payer: Self-pay | Admitting: Family Medicine

## 2021-02-01 DIAGNOSIS — E559 Vitamin D deficiency, unspecified: Secondary | ICD-10-CM

## 2021-02-04 ENCOUNTER — Encounter (INDEPENDENT_AMBULATORY_CARE_PROVIDER_SITE_OTHER): Payer: Self-pay

## 2021-02-04 ENCOUNTER — Telehealth (INDEPENDENT_AMBULATORY_CARE_PROVIDER_SITE_OTHER): Payer: BC Managed Care – PPO | Admitting: Psychology

## 2021-02-04 DIAGNOSIS — F5089 Other specified eating disorder: Secondary | ICD-10-CM

## 2021-02-04 NOTE — Telephone Encounter (Signed)
Last OV with Dr. Beasley 

## 2021-02-05 ENCOUNTER — Ambulatory Visit (INDEPENDENT_AMBULATORY_CARE_PROVIDER_SITE_OTHER): Payer: BC Managed Care – PPO | Admitting: Family Medicine

## 2021-02-05 ENCOUNTER — Encounter (INDEPENDENT_AMBULATORY_CARE_PROVIDER_SITE_OTHER): Payer: Self-pay

## 2021-02-12 NOTE — Progress Notes (Signed)
  Office: [phone]  /  Fax: [phone]    Date: February 25, 2021   Appointment Start Time: 10:00am Duration: 32 minutes Provider: Glennie Isle, Psy.D. Type of Session: Individual Therapy  Location of Patient: Home Location of Provider: Provider's home (private office) Type of Contact: Telepsychological Visit via MyChart Video Visit  Session Content: Betty Porter is a 50 y.o. female presenting for a follow-up appointment to address the previously established treatment goal of increasing coping skills. Today's appointment was a telepsychological visit due to COVID-19. Betty Porter provided verbal consent for today's telepsychological appointment and she is aware she is responsible for securing confidentiality on her end of the session. Prior to proceeding with today's appointment, Betty Porter's physical location at the time of this appointment was obtained as well a phone number she could be reached at in the event of technical difficulties. Betty Porter and this provider participated in today's telepsychological service.   This provider conducted a brief check-in. Betty Porter reported, "I haven't been good with the diet at all." She discussed deviations while camping in terms of snacks, adding she followed her meal plan for breakfast, lunch, and dinner. This was further explored and processed. It was reflected she was likely engaging in all or nothing thinking. Psychoeducation regarding the connection between thoughts, feelings, and behaviors was provided. Moreover, psychoeducation regarding other common cognitive distortions associated with emotional eating was provided. Livian provided verbal consent during today's appointment for this provider to send a handout about distortions via e-mail. Furthermore, this provider and Betty Porter refected on progress to date aside from the number on the scale (e.g., satisfaction of giving the body what it needs, reduction in discomfort from overeating, feeling more in control,  reduction in emotional eating behaviors). Overall, Betty Porter was receptive to today's appointment as evidenced by openness to sharing, responsiveness to feedback, and willingness to explore cognitive distortions.  Mental Status Examination:  Appearance: well groomed and appropriate hygiene  Behavior: appropriate to circumstances Mood: euthymic Affect: mood congruent Speech: normal in rate, volume, and tone Eye Contact: appropriate Psychomotor Activity: unable to assess Gait: unable to assess Thought Process: linear, logical, and goal directed  Thought Content/Perception: no hallucinations, delusions, bizarre thinking or behavior reported or observed and no evidence or endorsement of suicidal and homicidal ideation, plan, and intent Orientation: time, person, place, and purpose of appointment Memory/Concentration: memory, attention, language, and fund of knowledge intact  Insight/Judgment: good  Interventions:  Conducted a brief chart review Provided empathic reflections and validation Processed thoughts and feelings Psychoeducation provided regarding cognitive distortions  Psychoeducation provided regarding the connection between thoughts, feelings, and behaviors Employed supportive psychotherapy interventions to facilitate reduced distress, and to improve coping skills with identified stressors  DSM-5 Diagnosis(es): 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 Betty Porter has demonstrated progress in her goal as evidenced by increased awareness of hunger patterns and increased awareness of triggers for emotional eating behaviors. Betty Porter also continues to demonstrate willingness to engage in learned skill(s).  Plan: The next appointment will be scheduled in 3-4 weeks, which will be via MyChart Video Visit. The  next session will focus on working towards the established treatment goal and possible termination .

## 2021-02-14 ENCOUNTER — Other Ambulatory Visit: Payer: Self-pay | Admitting: Obstetrics & Gynecology

## 2021-02-14 DIAGNOSIS — Z1231 Encounter for screening mammogram for malignant neoplasm of breast: Secondary | ICD-10-CM

## 2021-02-25 ENCOUNTER — Telehealth (INDEPENDENT_AMBULATORY_CARE_PROVIDER_SITE_OTHER): Payer: BC Managed Care – PPO | Admitting: Psychology

## 2021-02-25 DIAGNOSIS — F5089 Other specified eating disorder: Secondary | ICD-10-CM | POA: Diagnosis not present

## 2021-02-28 ENCOUNTER — Ambulatory Visit (INDEPENDENT_AMBULATORY_CARE_PROVIDER_SITE_OTHER): Payer: BC Managed Care – PPO | Admitting: Family Medicine

## 2021-02-28 ENCOUNTER — Telehealth (INDEPENDENT_AMBULATORY_CARE_PROVIDER_SITE_OTHER): Payer: Self-pay

## 2021-02-28 ENCOUNTER — Other Ambulatory Visit: Payer: Self-pay

## 2021-02-28 ENCOUNTER — Encounter (INDEPENDENT_AMBULATORY_CARE_PROVIDER_SITE_OTHER): Payer: Self-pay | Admitting: Family Medicine

## 2021-02-28 VITALS — BP 109/73 | HR 60 | Temp 98.1°F | Ht 61.0 in | Wt 167.0 lb

## 2021-02-28 DIAGNOSIS — E038 Other specified hypothyroidism: Secondary | ICD-10-CM

## 2021-02-28 DIAGNOSIS — Z9189 Other specified personal risk factors, not elsewhere classified: Secondary | ICD-10-CM | POA: Diagnosis not present

## 2021-02-28 DIAGNOSIS — R7303 Prediabetes: Secondary | ICD-10-CM | POA: Diagnosis not present

## 2021-02-28 DIAGNOSIS — Z6833 Body mass index (BMI) 33.0-33.9, adult: Secondary | ICD-10-CM

## 2021-02-28 DIAGNOSIS — E782 Mixed hyperlipidemia: Secondary | ICD-10-CM

## 2021-02-28 DIAGNOSIS — E669 Obesity, unspecified: Secondary | ICD-10-CM

## 2021-02-28 DIAGNOSIS — E559 Vitamin D deficiency, unspecified: Secondary | ICD-10-CM | POA: Diagnosis not present

## 2021-02-28 MED ORDER — VITAMIN D (ERGOCALCIFEROL) 1.25 MG (50000 UNIT) PO CAPS: 50000.0000 [IU] | ORAL_CAPSULE | ORAL | 0 refills | Status: DC

## 2021-02-28 MED ORDER — LEVOTHYROXINE SODIUM 75 MCG PO TABS: 75.0000 ug | ORAL_TABLET | Freq: Every day | ORAL | 0 refills | Status: DC

## 2021-02-28 MED ORDER — GEMFIBROZIL 600 MG PO TABS
600.0000 mg | ORAL_TABLET | Freq: Two times a day (BID) | ORAL | 0 refills | Status: DC
Start: 2021-02-28 — End: 2021-04-02

## 2021-02-28 NOTE — Telephone Encounter (Signed)
Patient states she doesn't see the Vit D that Dr. Leafy Ro requested her take in her My Chart.  Please confirm.  Thank you

## 2021-03-04 NOTE — Telephone Encounter (Signed)
Last OV with Dr. Beasley 

## 2021-03-05 ENCOUNTER — Encounter (INDEPENDENT_AMBULATORY_CARE_PROVIDER_SITE_OTHER): Payer: Self-pay | Admitting: Family Medicine

## 2021-03-05 ENCOUNTER — Encounter (INDEPENDENT_AMBULATORY_CARE_PROVIDER_SITE_OTHER): Payer: Self-pay | Admitting: Emergency Medicine

## 2021-03-05 LAB — LIPID PANEL WITH LDL/HDL RATIO
Cholesterol, Total: 251 mg/dL — ABNORMAL HIGH (ref 100–199)
HDL: 49 mg/dL (ref >39)
LDL Chol Calc (NIH): 163 mg/dL — ABNORMAL HIGH (ref 0–99)
LDL/HDL Ratio: 3.3 ratio — ABNORMAL HIGH (ref 0.0–3.2)
Triglycerides: 214 mg/dL — ABNORMAL HIGH (ref 0–149)
VLDL Cholesterol Cal: 39 mg/dL (ref 5–40)

## 2021-03-05 LAB — TSH: TSH: 4.69 u[IU]/mL — ABNORMAL HIGH (ref 0.450–4.500)

## 2021-03-05 LAB — CMP14+EGFR
ALT: 14 IU/L (ref 0–32)
AST: 18 IU/L (ref 0–40)
Albumin/Globulin Ratio: 1.5 (ref 1.2–2.2)
Albumin: 4.4 g/dL (ref 3.8–4.8)
Alkaline Phosphatase: 79 IU/L (ref 44–121)
BUN/Creatinine Ratio: 17 (ref 9–23)
BUN: 21 mg/dL (ref 6–24)
Bilirubin Total: 0.2 mg/dL (ref 0.0–1.2)
CO2: 23 mmol/L (ref 20–29)
Calcium: 9.4 mg/dL (ref 8.7–10.2)
Chloride: 105 mmol/L (ref 96–106)
Creatinine, Ser: 1.23 mg/dL — ABNORMAL HIGH (ref 0.57–1.00)
Globulin, Total: 3 g/dL (ref 1.5–4.5)
Glucose: 86 mg/dL (ref 65–99)
Potassium: 4.6 mmol/L (ref 3.5–5.2)
Sodium: 140 mmol/L (ref 134–144)
Total Protein: 7.4 g/dL (ref 6.0–8.5)
eGFR: 54 mL/min/{1.73_m2} — ABNORMAL LOW (ref >59)

## 2021-03-05 LAB — HEMOGLOBIN A1C
Est. average glucose Bld gHb Est-mCnc: 108 mg/dL
Hgb A1c MFr Bld: 5.4 % (ref 4.8–5.6)

## 2021-03-05 LAB — INSULIN, RANDOM: INSULIN: 14.2 u[IU]/mL (ref 2.6–24.9)

## 2021-03-05 LAB — VITAMIN D 25 HYDROXY (VIT D DEFICIENCY, FRACTURES): Vit D, 25-Hydroxy: 29.5 ng/mL — ABNORMAL LOW (ref 30.0–100.0)

## 2021-03-05 NOTE — Progress Notes (Signed)
Office: [phone]  /  Fax: [phone]    Date: March 19, 2021   Appointment Start Time: 4:01pm Duration: 27 minutes Provider: Glennie Isle, Psy.D. Type of Session: Individual Therapy  Location of Patient: Home Location of Provider: Provider's home (private office) Type of Contact: Telepsychological Visit via MyChart Video Visit  Session Content: Betty Porter is a 50 y.o. female presenting for a follow-up appointment to address the previously established treatment goal of increasing coping skills. Today's appointment was a telepsychological visit due to COVID-19. Betty Porter provided verbal consent for today's telepsychological appointment and she is aware she is responsible for securing confidentiality on her end of the session. Prior to proceeding with today's appointment, Betty Porter's physical location at the time of this appointment was obtained as well a phone number she could be reached at in the event of technical difficulties. [name] and this provider participated in today's telepsychological service.   This provider conducted a brief check-in. Betty Porter shared she went to [place], [place] again last week. She acknowledged she deviated from her structured meal plan while she was away. Notably, since returning, she is "back on" her meal plan and continues to report a reduction in emotional eating behaviors. Positive reinforcement was provided. Additionally, psychoeducation provided regarding healthy weight loss. Associated thoughts/feelings were processed.   Session also focused on discussing what Betty Porter can do differently when she goes back home to [place] as she acknowledged she often associated family time with eating. During the course of the appointment, she acknowledged this trip was "different" as her parents' were in the process of moving and her mother just had surgery. As such, to help cope with associated guilt, previously discussed cognitive distortions were also reviewed. She was also  receptive to implementing the following strategies in the future when visiting with family: not going to meals starving; focusing on protein and vegetable intake prior to eating side dishes, dessert, etc.; not depriving herself; focusing on water intake; and practicing mindfulness. Overall, Betty Porter was receptive to today's appointment as evidenced by openness to sharing, responsiveness to feedback, and willingness to continue engaging in learned skills.  Mental Status Examination:  Appearance: well groomed and appropriate hygiene  Behavior: appropriate to circumstances Mood: euthymic Affect: mood congruent Speech: normal in rate, volume, and tone Eye Contact: appropriate Psychomotor Activity: unable to assess Gait: unable to assess Thought Process: linear, logical, and goal directed  Thought Content/Perception: no hallucinations, delusions, bizarre thinking or behavior reported or observed and no evidence or endorsement of suicidal and homicidal ideation, plan, and intent Orientation: time, person, place, and purpose of appointment Memory/Concentration: memory, attention, language, and fund of knowledge intact  Insight/Judgment: good  Interventions:  Conducted a brief chart review Provided empathic reflections and validation Engaged patient in problem solving Processed thoughts and feelings Employed supportive psychotherapy interventions to facilitate reduced distress, and to improve coping skills with identified stressors  DSM-5 Diagnosis(es): 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 Betty Porter demonstrated progress in her goal as evidenced by increased awareness of hunger patterns, increased awareness of triggers for emotional eating behaviors, and reduction in emotional eating behaviors . Betty Porter also  continues to demonstrate willingness to engage in learned skill(s).  Plan: As previously planned, today was Betty Porter's last appointment with this provider. She acknowledged understanding that she may request a follow-up appointment with this provider in the future as long as she is still  established with the clinic. No further follow-up planned by this provider.

## 2021-03-05 NOTE — Telephone Encounter (Signed)
Patient informed via my chart.

## 2021-03-07 NOTE — Progress Notes (Signed)
Chief Complaint:   OBESITY Betty Porter is here to discuss her progress with her obesity treatment plan along with follow-up of her obesity related diagnoses. Betty Porter is on the Category 3 Plan and states she is following her eating plan approximately 70% of the time. Aide states she is doing crossfit for 60 minutes 4 times per week.  Today's visit was #: 6 Starting weight: 178 lbs Starting date: 11/06/2020 Today's weight: 167 lbs Today's date: 02/28/2021 Total lbs lost to date: 11 Total lbs lost since last in-office visit: 3  Interim History: Betty Porter has done well with weight loss even on vacation. Her hunger is controlled and she is exercising well. She notes increased fatigue with exercise and she is not sure why.  Subjective:   1. Other specified hypothyroidism Betty Porter is stable on her medications, and she is due for labs.  2. Mixed hyperlipidemia Betty Porter is working on diet and exercise, and she is due for labs.  3. Pre-diabetes Betty Porter is working on decreasing simple carbohydrates and increase activities. She is due for labs.  4. Vitamin D deficiency Betty Porter is stable on Vit D, and her last Vit D level was not yet at goal.  5. At risk for dehydration Betty Porter is at risk for dehydration due to inadequate water intake.  Assessment/Plan:   1. Other specified hypothyroidism We will check labs today, and we will refill levothyroxine for 1 month. Orders and follow up as documented in patient record.  - levothyroxine (SYNTHROID) 75 MCG tablet; Take 1 tablet (75 mcg total) by mouth daily before breakfast.  Dispense: 30 tablet; Refill: 0 - TSH  2. Mixed hyperlipidemia Cardiovascular risk and specific lipid/LDL goals reviewed. We discussed several lifestyle modifications today. We will check labs today, and we will refill Lopid for 1 month. Ramonda will continue to work on diet, exercise and weight loss efforts. Orders and follow up as documented in patient record.   -  gemfibrozil (LOPID) 600 MG tablet; Take 1 tablet (600 mg total) by mouth in the morning and at bedtime.  Dispense: 30 tablet; Refill: 0 - CMP14+EGFR - Lipid Panel With LDL/HDL Ratio - Insulin, random - Hemoglobin A1c  3. Pre-diabetes Rhemi will continue to work on weight loss, exercise, and decreasing simple carbohydrates to help decrease the risk of diabetes. We will check labs today.  4. Vitamin D deficiency Low Vitamin D level contributes to fatigue and are associated with obesity, breast, and colon cancer. We will check labs today, and we will refill prescription Vitamin D for 1 month. Betty Porter will follow-up for routine testing of Vitamin D, at least 2-3 times per year to avoid over-replacement.  - VITAMIN D 25 Hydroxy (Vit-D Deficiency, Fractures) - Vitamin D, Ergocalciferol, (DRISDOL) 1.25 MG (50000 UNIT) CAPS capsule; Take 1 capsule (50,000 Units total) by mouth every 7 (seven) days.  Dispense: 4 capsule; Refill: 0  5. At risk for dehydration Betty Porter was given approximately 15 minutes dehydration prevention counseling today. Betty Porter is at risk for dehydration due to weight loss and current medication(s). She was encouraged to hydrate and monitor fluid status to avoid dehydration as well as weight loss plateaus.   6. Obesity with current BMI 31.6 Betty Porter is currently in the action stage of change. As such, her goal is to continue with weight loss efforts. She has agreed to the Category 3 Plan.   We will check labs to see if electrolytes are off.  Exercise goals: As is.  Behavioral modification strategies: increasing water intake.  Betty Porter has agreed to follow-up with our clinic in 3 weeks. She was informed of the importance of frequent follow-up visits to maximize her success with intensive lifestyle modifications for her multiple health conditions.   Betty Porter was informed we would discuss her lab results at her next visit unless there is a critical issue that needs to be  addressed sooner. Betty Porter agreed to keep her next visit at the agreed upon time to discuss these results.  Objective:   Blood pressure 109/73, pulse 60, temperature 98.1 F (36.7 C), height 5' 1"  (1.549 m), weight 167 lb (75.8 kg), SpO2 97 %. Body mass index is 31.55 kg/m.  General: Cooperative, alert, well developed, in no acute distress. HEENT: Conjunctivae and lids unremarkable. Cardiovascular: Regular rhythm.  Lungs: Normal work of breathing. Neurologic: No focal deficits.   Lab Results  Component Value Date   CREATININE 1.23 (H) 03/04/2021   BUN 21 03/04/2021   NA 140 03/04/2021   K 4.6 03/04/2021   CL 105 03/04/2021   CO2 23 03/04/2021   Lab Results  Component Value Date   ALT 14 03/04/2021   AST 18 03/04/2021   ALKPHOS 79 03/04/2021   BILITOT 0.2 03/04/2021   Lab Results  Component Value Date   HGBA1C 5.4 03/04/2021   HGBA1C 5.7 (H) 11/06/2020   Lab Results  Component Value Date   INSULIN 14.2 03/04/2021   INSULIN 11.9 11/06/2020   Lab Results  Component Value Date   TSH 4.690 (H) 03/04/2021   Lab Results  Component Value Date   CHOL 251 (H) 03/04/2021   HDL 49 03/04/2021   LDLCALC 163 (H) 03/04/2021   TRIG 214 (H) 03/04/2021   Lab Results  Component Value Date   VD25OH 29.5 (L) 03/04/2021   VD25OH 21.4 (L) 11/06/2020   Lab Results  Component Value Date   WBC 6.1 11/06/2020   HGB 13.0 11/06/2020   HCT 38.8 11/06/2020   MCV 87 11/06/2020   PLT 299 11/06/2020   No results found for: IRON, TIBC, FERRITIN  Attestation Statements:   Reviewed by clinician on day of visit: allergies, medications, problem list, medical history, surgical history, family history, social history, and previous encounter notes.   I, Trixie Dredge, am acting as transcriptionist for Dennard Nip, MD.  I have reviewed the above documentation for accuracy and completeness, and I agree with the above. -  Dennard Nip, MD

## 2021-03-19 ENCOUNTER — Telehealth (INDEPENDENT_AMBULATORY_CARE_PROVIDER_SITE_OTHER): Payer: BC Managed Care – PPO | Admitting: Psychology

## 2021-03-19 DIAGNOSIS — F5089 Other specified eating disorder: Secondary | ICD-10-CM

## 2021-04-01 ENCOUNTER — Ambulatory Visit (INDEPENDENT_AMBULATORY_CARE_PROVIDER_SITE_OTHER): Payer: BC Managed Care – PPO | Admitting: Family Medicine

## 2021-04-02 ENCOUNTER — Ambulatory Visit (INDEPENDENT_AMBULATORY_CARE_PROVIDER_SITE_OTHER): Payer: BC Managed Care – PPO | Admitting: Family Medicine

## 2021-04-02 ENCOUNTER — Other Ambulatory Visit: Payer: Self-pay

## 2021-04-02 ENCOUNTER — Encounter (INDEPENDENT_AMBULATORY_CARE_PROVIDER_SITE_OTHER): Payer: Self-pay | Admitting: Family Medicine

## 2021-04-02 VITALS — BP 110/75 | HR 68 | Temp 98.1°F | Ht 61.0 in | Wt 168.0 lb

## 2021-04-02 DIAGNOSIS — Z6833 Body mass index (BMI) 33.0-33.9, adult: Secondary | ICD-10-CM

## 2021-04-02 DIAGNOSIS — E669 Obesity, unspecified: Secondary | ICD-10-CM | POA: Diagnosis not present

## 2021-04-02 DIAGNOSIS — E781 Pure hyperglyceridemia: Secondary | ICD-10-CM

## 2021-04-02 DIAGNOSIS — E038 Other specified hypothyroidism: Secondary | ICD-10-CM

## 2021-04-02 DIAGNOSIS — E559 Vitamin D deficiency, unspecified: Secondary | ICD-10-CM

## 2021-04-02 DIAGNOSIS — Z9189 Other specified personal risk factors, not elsewhere classified: Secondary | ICD-10-CM

## 2021-04-02 MED ORDER — LEVOTHYROXINE SODIUM 75 MCG PO TABS: 75.0000 ug | ORAL_TABLET | Freq: Every day | ORAL | 0 refills | Status: DC

## 2021-04-02 MED ORDER — VITAMIN D (ERGOCALCIFEROL) 1.25 MG (50000 UNIT) PO CAPS: 50000.0000 [IU] | ORAL_CAPSULE | ORAL | 0 refills | Status: DC

## 2021-04-02 MED ORDER — GEMFIBROZIL 600 MG PO TABS: 600.0000 mg | ORAL_TABLET | Freq: Two times a day (BID) | ORAL | 0 refills | Status: DC

## 2021-04-03 NOTE — Progress Notes (Signed)
Chief Complaint:   OBESITY Betty Porter is here to discuss her progress with her obesity treatment plan along with follow-up of her obesity related diagnoses. Betty Porter is on the Category 3 Plan and states she is following her eating plan approximately 35-40% of the time. Betty Porter states she is doing Crossfit for 60 minutes 3-4 times per week.  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 She has gotten off track but she is working on getting back to a routine.   Subjective:   1. Vitamin D deficiency Betty Porter's Vit D level is not yet at goal. She denies nausea, vomiting, or muscle weakness.  2. Other specified hypothyroidism Betty Porter's TSH is elevated. She had been stable previously. She has missed some doses and has been taking it with other medications, which has likely decreased absorption.  3. Hypertriglyceridemia Betty Porter's triglycerides are elevated. She has gotten off track with eating but she is getting back on track.  4. At risk for impaired metabolic function Betty Porter is at increased risk for impaired metabolic function if skipping meals.  Assessment/Plan:   1. Vitamin D deficiency Low Vitamin D level contributes to fatigue and are associated with obesity, breast, and colon cancer. We will refill prescription Vitamin D for 1 month. Betty Porter will follow-up for routine testing of Vitamin D, at least 2-3 times per year to avoid over-replacement.  - Vitamin D, Ergocalciferol, (DRISDOL) 1.25 MG (50000 UNIT) CAPS capsule; Take 1 capsule (50,000 Units total) by mouth every 7 (seven) days.  Dispense: 4 capsule; Refill: 0  2. Other specified hypothyroidism We will refill levothyroxine for 1 month. Betty Porter is to take on an empty stomach and wait  1 hour before eating. Orders and follow up as documented in patient record.  - levothyroxine (SYNTHROID) 75 MCG tablet; Take 1 tablet (75 mcg total) by mouth daily before breakfast.  Dispense: 30 tablet; Refill: 0  3. Hypertriglyceridemia Cardiovascular risk and specific lipid/triglycerides goals reviewed.  We discussed several lifestyle modifications today. We will refill gemfibrozil for 1 month. Betty Porter will continue to work on diet, exercise and weight loss efforts. Orders and follow up as documented in patient record.   - gemfibrozil (LOPID) 600 MG tablet; Take 1 tablet (600 mg total) by mouth in the morning and at bedtime.  Dispense: 30 tablet; Refill: 0  4. At risk for impaired metabolic function Betty Porter was given approximately 15 minutes of impaired  metabolic function prevention counseling today. We discussed intensive lifestyle modifications today with an emphasis on specific nutrition and exercise instructions and strategies.   Repetitive spaced learning was employed today to elicit superior memory formation and behavioral change.  5. Obesity with current BMI 31.9 Betty Porter is currently in the action stage of change. As such, her goal is to continue with weight loss efforts. She has agreed to the Category 3 Plan.   Exercise goals: As is.  Behavioral modification strategies: no skipping meals and meal planning and cooking strategies.  Betty Porter has agreed to follow-up with our clinic in 3 weeks. She was informed of the importance of frequent follow-up visits to maximize her success with intensive lifestyle modifications for her multiple health conditions.   Objective:   Blood pressure 110/75, pulse 68, temperature 98.1 F (36.7 C), height '5\' 1"'$  (  1.549 m), weight 168 lb (76.2 kg), SpO2 97 %. Body mass index is 31.74 kg/m.  General: Cooperative, alert, well developed, in no acute distress. HEENT: Conjunctivae and lids unremarkable. Cardiovascular: Regular rhythm.  Lungs: Normal  work of breathing. Neurologic: No focal deficits.   Lab Results  Component Value Date   CREATININE 1.23 (H) 03/04/2021   BUN 21 03/04/2021   NA 140 03/04/2021   K 4.6 03/04/2021   CL 105 03/04/2021   CO2 23 03/04/2021   Lab Results  Component Value Date   ALT 14 03/04/2021   AST 18 03/04/2021   ALKPHOS 79 03/04/2021   BILITOT 0.2 03/04/2021   Lab Results  Component Value Date   HGBA1C 5.4 03/04/2021   HGBA1C 5.7 (H) 11/06/2020   Lab Results  Component Value Date   INSULIN 14.2 03/04/2021   INSULIN 11.9 11/06/2020   Lab Results  Component Value Date   TSH 4.690 (H) 03/04/2021   Lab Results  Component Value Date   CHOL 251 (H) 03/04/2021   HDL 49 03/04/2021   LDLCALC 163 (H) 03/04/2021   TRIG 214 (H) 03/04/2021   Lab Results  Component Value Date   VD25OH 29.5 (L) 03/04/2021   VD25OH 21.4 (L) 11/06/2020   Lab Results  Component Value Date   WBC 6.1 11/06/2020   HGB 13.0 11/06/2020   HCT 38.8 11/06/2020   MCV 87 11/06/2020   PLT 299 11/06/2020   No results found for: IRON, TIBC, FERRITIN  Attestation Statements:   Reviewed by clinician on day of visit: allergies, medications, problem list, medical history, surgical history, family history, social history, and previous encounter notes.   I, Trixie Dredge, am acting as transcriptionist for Dennard Nip, MD.  I have reviewed the above documentation for accuracy and completeness, and I agree with the above. -  Dennard Nip, MD

## 2021-04-11 ENCOUNTER — Other Ambulatory Visit: Payer: Self-pay

## 2021-04-11 ENCOUNTER — Ambulatory Visit
Admission: RE | Admit: 2021-04-11 | Discharge: 2021-04-11 | Disposition: A | Discharge status: Home / Self Care | Payer: BC Managed Care – PPO | Source: Ambulatory Visit | Attending: Obstetrics & Gynecology | Admitting: Obstetrics & Gynecology

## 2021-04-11 DIAGNOSIS — Z1231 Encounter for screening mammogram for malignant neoplasm of breast: Secondary | ICD-10-CM

## 2021-04-17 ENCOUNTER — Other Ambulatory Visit: Payer: Self-pay | Admitting: Obstetrics & Gynecology

## 2021-04-17 DIAGNOSIS — R928 Other abnormal and inconclusive findings on diagnostic imaging of breast: Secondary | ICD-10-CM

## 2021-04-24 ENCOUNTER — Ambulatory Visit (INDEPENDENT_AMBULATORY_CARE_PROVIDER_SITE_OTHER): Payer: BC Managed Care – PPO | Admitting: Adult Health

## 2021-05-01 ENCOUNTER — Other Ambulatory Visit (INDEPENDENT_AMBULATORY_CARE_PROVIDER_SITE_OTHER): Payer: Self-pay | Admitting: Family Medicine

## 2021-05-01 DIAGNOSIS — E559 Vitamin D deficiency, unspecified: Secondary | ICD-10-CM

## 2021-05-01 NOTE — Telephone Encounter (Signed)
Last OV with Dr. Beasley 

## 2021-05-01 NOTE — Telephone Encounter (Signed)
LAST APPOINTMENT DATE: 04/02/21 NEXT APPOINTMENT DATE: 05/13/21   [facility] [id number] [place] [address] Phone: [phone] Fax: [phone]  Patient is requesting a refill of the following medications: Requested Prescriptions   Pending Prescriptions Disp Refills   Vitamin D, Ergocalciferol, (DRISDOL) 1.25 MG (50000 UNIT) CAPS capsule [Pharmacy Med Name: VIT D2 (ERGOCAL) 1.25MG (50,000U) CP] 4 capsule 0    Sig: TAKE ONE CAPSULE BY MOUTH ONCE WEEKLY (EVERY 7 DAYS)    Date last filled: 04/02/21 Previously prescribed by [facility]  Lab Results  Component Value Date   HGBA1C 5.4 03/04/2021   HGBA1C 5.7 (H) 11/06/2020   Lab Results  Component Value Date   LDLCALC 163 (H) 03/04/2021   CREATININE 1.23 (H) 03/04/2021   Lab Results  Component Value Date   VD25OH 29.5 (L) 03/04/2021   VD25OH 21.4 (L) 11/06/2020    BP Readings from Last 3 Encounters:  04/02/21 110/75  02/28/21 109/73  01/16/21 102/66

## 2021-05-09 ENCOUNTER — Other Ambulatory Visit: Payer: Self-pay

## 2021-05-09 ENCOUNTER — Ambulatory Visit
Admission: RE | Admit: 2021-05-09 | Discharge: 2021-05-09 | Disposition: A | Discharge status: Home / Self Care | Payer: BC Managed Care – PPO | Source: Ambulatory Visit | Attending: Obstetrics & Gynecology | Admitting: Obstetrics & Gynecology

## 2021-05-09 DIAGNOSIS — R928 Other abnormal and inconclusive findings on diagnostic imaging of breast: Secondary | ICD-10-CM

## 2021-05-13 ENCOUNTER — Encounter (INDEPENDENT_AMBULATORY_CARE_PROVIDER_SITE_OTHER): Payer: Self-pay | Admitting: Family Medicine

## 2021-05-13 ENCOUNTER — Other Ambulatory Visit: Payer: Self-pay

## 2021-05-13 ENCOUNTER — Ambulatory Visit (INDEPENDENT_AMBULATORY_CARE_PROVIDER_SITE_OTHER): Payer: BC Managed Care – PPO | Admitting: Family Medicine

## 2021-05-13 VITALS — BP 121/82 | HR 66 | Temp 98.0°F | Ht 61.0 in | Wt 169.0 lb

## 2021-05-13 DIAGNOSIS — Z9189 Other specified personal risk factors, not elsewhere classified: Secondary | ICD-10-CM

## 2021-05-13 DIAGNOSIS — E559 Vitamin D deficiency, unspecified: Secondary | ICD-10-CM | POA: Diagnosis not present

## 2021-05-13 DIAGNOSIS — E038 Other specified hypothyroidism: Secondary | ICD-10-CM

## 2021-05-13 DIAGNOSIS — Z6833 Body mass index (BMI) 33.0-33.9, adult: Secondary | ICD-10-CM

## 2021-05-13 DIAGNOSIS — E669 Obesity, unspecified: Secondary | ICD-10-CM

## 2021-05-13 DIAGNOSIS — E781 Pure hyperglyceridemia: Secondary | ICD-10-CM

## 2021-05-13 DIAGNOSIS — F3289 Other specified depressive episodes: Secondary | ICD-10-CM

## 2021-05-13 DIAGNOSIS — E7849 Other hyperlipidemia: Secondary | ICD-10-CM

## 2021-05-13 MED ORDER — GEMFIBROZIL 600 MG PO TABS: 600.0000 mg | ORAL_TABLET | Freq: Two times a day (BID) | ORAL | 0 refills | Status: DC

## 2021-05-13 MED ORDER — BUPROPION HCL ER (SR) 150 MG PO TB12: 150.0000 mg | ORAL_TABLET | Freq: Every morning | ORAL | 0 refills | Status: DC

## 2021-05-13 MED ORDER — VITAMIN D (ERGOCALCIFEROL) 1.25 MG (50000 UNIT) PO CAPS: ORAL_CAPSULE | ORAL | 0 refills | Status: DC

## 2021-05-13 MED ORDER — LEVOTHYROXINE SODIUM 75 MCG PO TABS: 75.0000 ug | ORAL_TABLET | Freq: Every day | ORAL | 0 refills | Status: DC

## 2021-05-14 ENCOUNTER — Other Ambulatory Visit (INDEPENDENT_AMBULATORY_CARE_PROVIDER_SITE_OTHER): Payer: Self-pay | Admitting: Family Medicine

## 2021-05-14 DIAGNOSIS — E7849 Other hyperlipidemia: Secondary | ICD-10-CM

## 2021-05-14 NOTE — Progress Notes (Signed)
Chief Complaint:   OBESITY Betty Porter is here to discuss her progress with her obesity treatment plan along with follow-up of her obesity related diagnoses. Betty Porter is on the Category 3 Plan and states she is following her eating plan approximately 40% of the time. Betty Porter states she is doing crossfit for 60 minutes 4 times per week.  Today's visit was #: 8 Starting weight: 178 lbs Starting date: 11/06/2020 Today's weight: 169 lbs Today's date: 05/13/2021 Total lbs lost to date: 9 Total lbs lost since last in-office visit: 0  Interim History: Betty Porter has struggled with weight loss recently, and she notes some increased cravings and decreased motivation.  Subjective:   1. Other hyperlipidemia Betty Porter is working on diet and weight loss. She requests a refill of gemfibrozil.  2. Other specified hypothyroidism Betty Porter is stable on her medications, and she has no signs of over or under-replacement. Last labs were within normal limits.  3. Vitamin D deficiency Betty Porter is stable, but her level is not yet at goal. She requests a refill today.  4. Other depression with emotional eating Betty Porter notes increased emotional eating behaviors, especially worse in the PM. She is open to discussing medication options.  5. At risk for impaired metabolic function Betty Porter is at increased risk for impaired metabolic function if calories or protein decrease too low.  Assessment/Plan:   1. Other hyperlipidemia Cardiovascular risk and specific lipid/LDL goals reviewed. We discussed several lifestyle modifications today. We will refill gemfibrozil for 1 month. Maripat will continue to work on diet, exercise and weight loss efforts. Orders and follow up as documented in patient record.   - gemfibrozil (LOPID) 600 MG tablet; Take 1 tablet (600 mg total) by mouth in the morning and at bedtime.  Dispense: 30 tablet; Refill: 0  2. Other specified hypothyroidism Betty Porter will continue levothyroxine, and we  will refill for 1 month. Orders and follow up as documented in patient record.  - levothyroxine (SYNTHROID) 75 MCG tablet; Take 1 tablet (75 mcg total) by mouth daily before breakfast.  Dispense: 30 tablet; Refill: 0  3. Vitamin D deficiency Low Vitamin D level contributes to fatigue and are associated with obesity, breast, and colon cancer. We will refill prescription Vitamin D for 1 month. Betty Porter will follow-up for routine testing of Vitamin D, at least 2-3 times per year to avoid over-replacement.  - Vitamin D, Ergocalciferol, (DRISDOL) 1.25 MG (50000 UNIT) CAPS capsule; TAKE ONE CAPSULE BY MOUTH ONCE WEEKLY (EVERY 7 DAYS)  Dispense: 4 capsule; Refill: 0  4. Other depression with emotional eating Emotional eating behavior strategies were discussed today to help Betty Porter deal with her emotional/non-hunger eating behaviors. Betty Porter agreed to start Wellbutrin SR 150 mg q AM with no refills. Orders and follow up as documented in patient record.   - buPROPion (WELLBUTRIN SR) 150 MG 12 hr tablet; Take 1 tablet (150 mg total) by mouth every morning.  Dispense: 30 tablet; Refill: 0  5. At risk for impaired metabolic function Betty Porter was given approximately 15 minutes of impaired  metabolic function prevention counseling today. We discussed intensive lifestyle modifications today with an emphasis on specific nutrition and exercise instructions and strategies.   Repetitive spaced learning was employed today to elicit superior memory formation and behavioral change.  6. Obesity with current BMI 32.0 Betty Porter is currently in the action stage of change. As such, her goal is to continue with weight loss efforts. She has agreed to the Category 3 Plan.   Exercise goals: As  is.  Behavioral modification strategies: no skipping meals, meal planning and cooking strategies, and emotional eating strategies.  Betty Porter has agreed to follow-up with our clinic in 3 weeks. She was informed of the importance of  frequent follow-up visits to maximize her success with intensive lifestyle modifications for her multiple health conditions.   Objective:   Blood pressure 121/82, pulse 66, temperature 98 F (36.7 C), height 5\' 1"  (1.549 m), weight 169 lb (76.7 kg), last menstrual period 04/30/2021, SpO2 98 %. Body mass index is 31.93 kg/m.  General: Cooperative, alert, well developed, in no acute distress. HEENT: Conjunctivae and lids unremarkable. Cardiovascular: Regular rhythm.  Lungs: Normal work of breathing. Neurologic: No focal deficits.   Lab Results  Component Value Date   CREATININE 1.23 (H) 03/04/2021   BUN 21 03/04/2021   NA 140 03/04/2021   K 4.6 03/04/2021   CL 105 03/04/2021   CO2 23 03/04/2021   Lab Results  Component Value Date   ALT 14 03/04/2021   AST 18 03/04/2021   ALKPHOS 79 03/04/2021   BILITOT 0.2 03/04/2021   Lab Results  Component Value Date   HGBA1C 5.4 03/04/2021   HGBA1C 5.7 (H) 11/06/2020   Lab Results  Component Value Date   INSULIN 14.2 03/04/2021   INSULIN 11.9 11/06/2020   Lab Results  Component Value Date   TSH 4.690 (H) 03/04/2021   Lab Results  Component Value Date   CHOL 251 (H) 03/04/2021   HDL 49 03/04/2021   LDLCALC 163 (H) 03/04/2021   TRIG 214 (H) 03/04/2021   Lab Results  Component Value Date   VD25OH 29.5 (L) 03/04/2021   VD25OH 21.4 (L) 11/06/2020   Lab Results  Component Value Date   WBC 6.1 11/06/2020   HGB 13.0 11/06/2020   HCT 38.8 11/06/2020   MCV 87 11/06/2020   PLT 299 11/06/2020   No results found for: IRON, TIBC, FERRITIN  Attestation Statements:   Reviewed by clinician on day of visit: allergies, medications, problem list, medical history, surgical history, family history, social history, and previous encounter notes.   I, Trixie Dredge, am acting as transcriptionist for Dennard Nip, MD.  I have reviewed the above documentation for accuracy and completeness, and I agree with the above. -  Dennard Nip,  MD

## 2021-05-20 ENCOUNTER — Encounter (INDEPENDENT_AMBULATORY_CARE_PROVIDER_SITE_OTHER): Payer: Self-pay | Admitting: Family Medicine

## 2021-05-21 NOTE — Telephone Encounter (Signed)
Last OV with Dr. Beasley 

## 2021-05-27 ENCOUNTER — Other Ambulatory Visit (INDEPENDENT_AMBULATORY_CARE_PROVIDER_SITE_OTHER): Payer: Self-pay | Admitting: Family Medicine

## 2021-05-27 DIAGNOSIS — E559 Vitamin D deficiency, unspecified: Secondary | ICD-10-CM

## 2021-05-29 ENCOUNTER — Other Ambulatory Visit (INDEPENDENT_AMBULATORY_CARE_PROVIDER_SITE_OTHER): Payer: Self-pay | Admitting: Family Medicine

## 2021-05-29 DIAGNOSIS — E038 Other specified hypothyroidism: Secondary | ICD-10-CM

## 2021-06-04 ENCOUNTER — Encounter (INDEPENDENT_AMBULATORY_CARE_PROVIDER_SITE_OTHER): Payer: Self-pay | Admitting: Family Medicine

## 2021-06-04 ENCOUNTER — Ambulatory Visit (INDEPENDENT_AMBULATORY_CARE_PROVIDER_SITE_OTHER): Payer: BC Managed Care – PPO | Admitting: Family Medicine

## 2021-06-04 ENCOUNTER — Other Ambulatory Visit: Payer: Self-pay

## 2021-06-04 VITALS — BP 119/72 | HR 74 | Temp 97.9°F | Ht 61.0 in | Wt 166.0 lb

## 2021-06-04 DIAGNOSIS — Z9189 Other specified personal risk factors, not elsewhere classified: Secondary | ICD-10-CM | POA: Diagnosis not present

## 2021-06-04 DIAGNOSIS — E669 Obesity, unspecified: Secondary | ICD-10-CM

## 2021-06-04 DIAGNOSIS — E038 Other specified hypothyroidism: Secondary | ICD-10-CM | POA: Diagnosis not present

## 2021-06-04 DIAGNOSIS — E559 Vitamin D deficiency, unspecified: Secondary | ICD-10-CM | POA: Diagnosis not present

## 2021-06-04 DIAGNOSIS — E7849 Other hyperlipidemia: Secondary | ICD-10-CM

## 2021-06-04 DIAGNOSIS — E66811 Obesity, class 1: Secondary | ICD-10-CM

## 2021-06-04 DIAGNOSIS — Z6833 Body mass index (BMI) 33.0-33.9, adult: Secondary | ICD-10-CM

## 2021-06-04 DIAGNOSIS — F3289 Other specified depressive episodes: Secondary | ICD-10-CM | POA: Diagnosis not present

## 2021-06-04 MED ORDER — LEVOTHYROXINE SODIUM 75 MCG PO TABS: 75.0000 ug | ORAL_TABLET | Freq: Every day | ORAL | 0 refills | Status: DC

## 2021-06-04 MED ORDER — VITAMIN D (ERGOCALCIFEROL) 1.25 MG (50000 UNIT) PO CAPS: ORAL_CAPSULE | ORAL | 0 refills | Status: DC

## 2021-06-04 MED ORDER — BUPROPION HCL ER (SR) 200 MG PO TB12: 200.0000 mg | ORAL_TABLET | Freq: Every day | ORAL | 0 refills | Status: DC

## 2021-06-04 MED ORDER — GEMFIBROZIL 600 MG PO TABS: 600.0000 mg | ORAL_TABLET | Freq: Two times a day (BID) | ORAL | 0 refills | Status: DC

## 2021-06-04 NOTE — Progress Notes (Signed)
Chief Complaint:   OBESITY Betty Porter is here to discuss her progress with her obesity treatment plan along with follow-up of her obesity related diagnoses. Betty Porter is on the Category 3 Plan and states she is following her eating plan approximately 70% of the time. Betty Porter states she is doing cross fit for 60 minutes 4 times per week.  Today's visit was #: 9 Starting weight: 178 lbs Starting date: 11/06/2020 Today's weight: 166 lbs Today's date: 06/04/2021 Total lbs lost to date: 12 Total lbs lost since last in-office visit: 3  Interim History: Betty Porter continues to do well with weight loss on her Category 3 plan. She is starting to get bored with her breakfast options.  Subjective:   1. Other specified hypothyroidism Betty Porter is stable on her medications, and no palpitations or tremors were noted.  2. Other hyperlipidemia Betty Porter is working on diet and exercise, and she requests a refill today. She denies abdominal pain.  3. Vitamin D deficiency Betty Porter is on Vit D, and she denies nausea, vomiting, or muscle weakness.  4. Other depression with emotional eating Betty Porter started Wellbutrin but she does not notice any improvement in emotional eating behaviors.  5. At risk for impaired metabolic function Betty Porter is at increased risk for impaired metabolic function if protein decreases.  Assessment/Plan:   1. Other specified hypothyroidism Betty Porter will continue her medication, and we will refill levothyroxine for 1 month. Orders and follow up as documented in patient record.  - levothyroxine (SYNTHROID) 75 MCG tablet; Take 1 tablet (75 mcg total) by mouth daily before breakfast.  Dispense: 30 tablet; Refill: 0  2. Other hyperlipidemia Cardiovascular risk and specific lipid/LDL goals reviewed. We discussed several lifestyle modifications today. We will refill Lopid for 1 month. Betty Porter will continue to work on diet, exercise and weight loss efforts. Orders and follow up as  documented in patient record.   - gemfibrozil (LOPID) 600 MG tablet; Take 1 tablet (600 mg total) by mouth in the morning and at bedtime.  Dispense: 30 tablet; Refill: 0  3. Vitamin D deficiency Low Vitamin D level contributes to fatigue and are associated with obesity, breast, and colon cancer. We will refill prescription Vitamin D 50,000 IU every week for 1 month. Betty Porter will follow-up for routine testing of Vitamin D, at least 2-3 times per year to avoid over-replacement.  - Vitamin D, Ergocalciferol, (DRISDOL) 1.25 MG (50000 UNIT) CAPS capsule; TAKE ONE CAPSULE BY MOUTH ONCE WEEKLY (EVERY 7 DAYS)  Dispense: 4 capsule; Refill: 0  4. Other depression with emotional eating Behavior modification techniques were discussed today to help Betty Porter deal with her emotional/non-hunger eating behaviors. Betty Porter agreed to increase Wellbutrin SR to 200 mg q AM with no refills. Orders and follow up as documented in patient record.   - buPROPion (WELLBUTRIN SR) 200 MG 12 hr tablet; Take 1 tablet (200 mg total) by mouth daily with breakfast.  Dispense: 30 tablet; Refill: 0  5. At risk for impaired metabolic function Betty Porter was given approximately 15 minutes of impaired  metabolic function prevention counseling today. We discussed intensive lifestyle modifications today with an emphasis on specific nutrition and exercise instructions and strategies.   Repetitive spaced learning was employed today to elicit superior memory formation and behavioral change.  6. Obesity with current BMI 31.5 Betty Porter is currently in the action stage of change. As such, her goal is to continue with weight loss efforts. She has agreed to the Category 3 Plan and keeping a food journal and  adhering to recommended goals of 300-400 calories and 20+ grams of protein at breakfast daily.   Exercise goals: As is.  Behavioral modification strategies: increasing lean protein intake and meal planning and cooking strategies.  Betty Porter has  agreed to follow-up with our clinic in 3 weeks. She was informed of the importance of frequent follow-up visits to maximize her success with intensive lifestyle modifications for her multiple health conditions.   Objective:   Blood pressure 119/72, pulse 74, temperature 97.9 F (36.6 C), height 5\' 1"  (1.549 m), weight 166 lb (75.3 kg), SpO2 98 %. Body mass index is 31.37 kg/m.  General: Cooperative, alert, well developed, in no acute distress. HEENT: Conjunctivae and lids unremarkable. Cardiovascular: Regular rhythm.  Lungs: Normal work of breathing. Neurologic: No focal deficits.   Lab Results  Component Value Date   CREATININE 1.23 (H) 03/04/2021   BUN 21 03/04/2021   NA 140 03/04/2021   K 4.6 03/04/2021   CL 105 03/04/2021   CO2 23 03/04/2021   Lab Results  Component Value Date   ALT 14 03/04/2021   AST 18 03/04/2021   ALKPHOS 79 03/04/2021   BILITOT 0.2 03/04/2021   Lab Results  Component Value Date   HGBA1C 5.4 03/04/2021   HGBA1C 5.7 (H) 11/06/2020   Lab Results  Component Value Date   INSULIN 14.2 03/04/2021   INSULIN 11.9 11/06/2020   Lab Results  Component Value Date   TSH 4.690 (H) 03/04/2021   Lab Results  Component Value Date   CHOL 251 (H) 03/04/2021   HDL 49 03/04/2021   LDLCALC 163 (H) 03/04/2021   TRIG 214 (H) 03/04/2021   Lab Results  Component Value Date   VD25OH 29.5 (L) 03/04/2021   VD25OH 21.4 (L) 11/06/2020   Lab Results  Component Value Date   WBC 6.1 11/06/2020   HGB 13.0 11/06/2020   HCT 38.8 11/06/2020   MCV 87 11/06/2020   PLT 299 11/06/2020   No results found for: IRON, TIBC, FERRITIN  Attestation Statements:   Reviewed by clinician on day of visit: allergies, medications, problem list, medical history, surgical history, family history, social history, and previous encounter notes.   I, Trixie Dredge, am acting as transcriptionist for Dennard Nip, MD.  I have reviewed the above documentation for accuracy and  completeness, and I agree with the above. -  Dennard Nip, MD

## 2021-06-17 ENCOUNTER — Other Ambulatory Visit (INDEPENDENT_AMBULATORY_CARE_PROVIDER_SITE_OTHER): Payer: Self-pay | Admitting: Family Medicine

## 2021-06-17 DIAGNOSIS — F3289 Other specified depressive episodes: Secondary | ICD-10-CM

## 2021-06-18 ENCOUNTER — Other Ambulatory Visit (INDEPENDENT_AMBULATORY_CARE_PROVIDER_SITE_OTHER): Payer: Self-pay | Admitting: Family Medicine

## 2021-06-18 DIAGNOSIS — E7849 Other hyperlipidemia: Secondary | ICD-10-CM

## 2021-06-19 NOTE — Telephone Encounter (Signed)
Pt last seen by Dr. Beasley.  

## 2021-06-24 ENCOUNTER — Encounter (INDEPENDENT_AMBULATORY_CARE_PROVIDER_SITE_OTHER): Payer: Self-pay

## 2021-06-24 ENCOUNTER — Ambulatory Visit (INDEPENDENT_AMBULATORY_CARE_PROVIDER_SITE_OTHER): Payer: BC Managed Care – PPO | Admitting: Bariatrics

## 2021-07-03 ENCOUNTER — Other Ambulatory Visit (INDEPENDENT_AMBULATORY_CARE_PROVIDER_SITE_OTHER): Payer: Self-pay | Admitting: Family Medicine

## 2021-07-03 DIAGNOSIS — F3289 Other specified depressive episodes: Secondary | ICD-10-CM

## 2021-07-08 NOTE — Telephone Encounter (Signed)
LAST APPOINTMENT DATE: 06/04/21 NEXT APPOINTMENT DATE: 07/16/21   [facility] [id number], [place] [address] Phone: [phone] Fax: [phone]  Patient is requesting a refill of the following medications: Requested Prescriptions   Pending Prescriptions Disp Refills   buPROPion (WELLBUTRIN SR) 200 MG 12 hr tablet [Pharmacy Med Name: buPROPion HCL SR 200 MG TABLET] 30 tablet 0    Sig: TAKE ONE TABLET BY MOUTH DAILY WITH BREAKFAST    Date last filled: 06/04/21 Previously prescribed by Dr. Leafy Ro  Lab Results  Component Value Date   HGBA1C 5.4 03/04/2021   HGBA1C 5.7 (H) 11/06/2020   Lab Results  Component Value Date   LDLCALC 163 (H) 03/04/2021   CREATININE 1.23 (H) 03/04/2021   Lab Results  Component Value Date   VD25OH 29.5 (L) 03/04/2021   VD25OH 21.4 (L) 11/06/2020    BP Readings from Last 3 Encounters:  06/04/21 119/72  05/13/21 121/82  04/02/21 110/75

## 2021-07-16 ENCOUNTER — Ambulatory Visit (INDEPENDENT_AMBULATORY_CARE_PROVIDER_SITE_OTHER): Payer: BC Managed Care – PPO | Admitting: Family Medicine

## 2021-07-16 ENCOUNTER — Encounter (INDEPENDENT_AMBULATORY_CARE_PROVIDER_SITE_OTHER): Payer: Self-pay

## 2021-07-18 ENCOUNTER — Encounter (INDEPENDENT_AMBULATORY_CARE_PROVIDER_SITE_OTHER): Payer: Self-pay

## 2021-09-10 ENCOUNTER — Ambulatory Visit (INDEPENDENT_AMBULATORY_CARE_PROVIDER_SITE_OTHER): Payer: BC Managed Care – PPO | Admitting: Physician Assistant

## 2021-09-10 ENCOUNTER — Other Ambulatory Visit: Payer: Self-pay

## 2021-09-10 ENCOUNTER — Encounter (INDEPENDENT_AMBULATORY_CARE_PROVIDER_SITE_OTHER): Payer: Self-pay | Admitting: Physician Assistant

## 2021-09-10 VITALS — BP 113/77 | HR 66 | Temp 98.0°F | Ht 61.0 in | Wt 175.0 lb

## 2021-09-10 DIAGNOSIS — E038 Other specified hypothyroidism: Secondary | ICD-10-CM

## 2021-09-10 DIAGNOSIS — E7849 Other hyperlipidemia: Secondary | ICD-10-CM | POA: Diagnosis not present

## 2021-09-10 DIAGNOSIS — F3289 Other specified depressive episodes: Secondary | ICD-10-CM | POA: Diagnosis not present

## 2021-09-10 DIAGNOSIS — E559 Vitamin D deficiency, unspecified: Secondary | ICD-10-CM

## 2021-09-10 DIAGNOSIS — Z6833 Body mass index (BMI) 33.0-33.9, adult: Secondary | ICD-10-CM

## 2021-09-10 DIAGNOSIS — Z9189 Other specified personal risk factors, not elsewhere classified: Secondary | ICD-10-CM

## 2021-09-10 DIAGNOSIS — E669 Obesity, unspecified: Secondary | ICD-10-CM

## 2021-09-10 MED ORDER — LEVOTHYROXINE SODIUM 75 MCG PO TABS: 75.0000 ug | ORAL_TABLET | Freq: Every day | ORAL | 0 refills | Status: DC

## 2021-09-10 MED ORDER — VITAMIN D (ERGOCALCIFEROL) 1.25 MG (50000 UNIT) PO CAPS: ORAL_CAPSULE | ORAL | 0 refills | Status: DC

## 2021-09-10 MED ORDER — BUPROPION HCL ER (SR) 150 MG PO TB12
150.0000 mg | ORAL_TABLET | Freq: Every day | ORAL | 0 refills | Status: DC
Start: 2021-09-10 — End: 2021-09-24

## 2021-09-10 MED ORDER — GEMFIBROZIL 600 MG PO TABS: 600.0000 mg | ORAL_TABLET | Freq: Two times a day (BID) | ORAL | 0 refills | Status: DC

## 2021-09-10 NOTE — Progress Notes (Signed)
Chief Complaint:   OBESITY Betty Porter is here to discuss her progress with her obesity treatment plan along with follow-up of her obesity related diagnoses. Betty Porter is on the Category 3 Plan and keeping a food journal and adhering to recommended goals of 300-400 calories and 20 grams protein at breakfast and states she is following her eating plan approximately 0% of the time. Betty Porter states she is doing Crossfit 60 minutes 2 times per week.  Today's visit was #: 10 Starting weight: 178 lbs Starting date: 11/06/2020 Today's weight: 175 lbs Today's date: 09/10/2021 Total lbs lost to date: 3 Total lbs lost since last in-office visit: 0  Interim History: Pt's last OV was 06-15-21. Her husband passed away in 2023/08/07. For the first few weeks she did not have an appetite but she has been struggling with overeating the last few weeks. She feels like she needs a structured plan to get restarted ([facility] plan reviewed as option).  Subjective:   1. Vitamin D deficiency Pt has been out of Vit D for 2 months.  2. Other depression with emotional eating When she was taking bupropion, it helped pt with cravings.  3. Other hyperlipidemia Pt has been out of lopid for the last couple of months.  4. Other specified hypothyroidism She has been out of levothyroxine for 2 months.  5. At risk for heart disease Betty Porter is at higher than average risk for cardiovascular disease due to obesity.  Assessment/Plan:   1. Vitamin D deficiency Low Vitamin D level contributes to fatigue and are associated with obesity, breast, and colon cancer. She agrees to continue to take prescription Vitamin D @50 ,000 IU every week and will follow-up for routine testing of Vitamin D, at least 2-3 times per year to avoid over-replacement.  Refill- Vitamin D, Ergocalciferol, (DRISDOL) 1.25 MG (50000 UNIT) CAPS capsule; TAKE ONE CAPSULE BY MOUTH ONCE WEEKLY (EVERY 7 DAYS)  Dispense: 4 capsule; Refill: 0  2. Other  depression with emotional eating Behavior modification techniques were discussed today to help Betty Porter deal with her emotional/non-hunger eating behaviors.  Orders and follow up as documented in patient record.   Refill- buPROPion (WELLBUTRIN SR) 150 MG 12 hr tablet; Take 1 tablet (150 mg total) by mouth daily.  Dispense: 30 tablet; Refill: 0  3. Other hyperlipidemia Cardiovascular risk and specific lipid/LDL goals reviewed.  We discussed several lifestyle modifications today and Betty Porter will continue to work on diet, exercise and weight loss efforts. Orders and follow up as documented in patient record. Check labs at next visit.  Counseling Intensive lifestyle modifications are the first line treatment for this issue. Dietary changes: Increase soluble fiber. Decrease simple carbohydrates. Exercise changes: Moderate to vigorous-intensity aerobic activity 150 minutes per week if tolerated. Lipid-lowering medications: see documented in medical record.  Refill- gemfibrozil (LOPID) 600 MG tablet; Take 1 tablet (600 mg total) by mouth in the morning and at bedtime.  Dispense: 60 tablet; Refill: 0  4. Other specified hypothyroidism Patient with long-standing hypothyroidism, on levothyroxine therapy. She appears euthyroid. Orders and follow up as documented in patient record. Check labs at next visit.  Counseling Good thyroid control is important for overall health. Supratherapeutic thyroid levels are dangerous and will not improve weight loss results. The correct way to take levothyroxine is fasting, with water, separated by at least 30 minutes from breakfast, and separated by more than 4 hours from calcium, iron, multivitamins, acid reflux medications (PPIs).   Refill- levothyroxine (SYNTHROID) 75 MCG tablet; Take 1 tablet (  75 mcg total) by mouth daily before breakfast.  Dispense: 30 tablet; Refill: 0  5. At risk for heart disease Betty Porter was given approximately 15 minutes of coronary artery  disease prevention counseling today. She is 51 y.o. female and has risk factors for heart disease including obesity. We discussed intensive lifestyle modifications today with an emphasis on specific weight loss instructions and strategies.  Repetitive spaced learning was employed today to elicit superior memory formation and behavioral change.   6. Obesity with current BMI 33.08 Betty Porter is currently in the action stage of change. As such, her goal is to continue with weight loss efforts. She has agreed to the Category 3 Plan.   Labs at next visit.  Exercise goals:  As is  Behavioral modification strategies: no skipping meals and meal planning and cooking strategies.  Betty Porter has agreed to follow-up with our clinic in 2 weeks. She was informed of the importance of frequent follow-up visits to maximize her success with intensive lifestyle modifications for her multiple health conditions.   Objective:   Blood pressure 113/77, pulse 66, temperature 98 F (36.7 C), height 5\' 1"  (1.549 m), weight 175 lb (79.4 kg), SpO2 99 %. Body mass index is 33.07 kg/m.  General: Cooperative, alert, well developed, in no acute distress. HEENT: Conjunctivae and lids unremarkable. Cardiovascular: Regular rhythm.  Lungs: Normal work of breathing. Neurologic: No focal deficits.   Lab Results  Component Value Date   CREATININE 1.23 (H) 03/04/2021   BUN 21 03/04/2021   NA 140 03/04/2021   K 4.6 03/04/2021   CL 105 03/04/2021   CO2 23 03/04/2021   Lab Results  Component Value Date   ALT 14 03/04/2021   AST 18 03/04/2021   ALKPHOS 79 03/04/2021   BILITOT 0.2 03/04/2021   Lab Results  Component Value Date   HGBA1C 5.4 03/04/2021   HGBA1C 5.7 (H) 11/06/2020   Lab Results  Component Value Date   INSULIN 14.2 03/04/2021   INSULIN 11.9 11/06/2020   Lab Results  Component Value Date   TSH 4.690 (H) 03/04/2021   Lab Results  Component Value Date   CHOL 251 (H) 03/04/2021   HDL 49 03/04/2021    LDLCALC 163 (H) 03/04/2021   TRIG 214 (H) 03/04/2021   Lab Results  Component Value Date   VD25OH 29.5 (L) 03/04/2021   VD25OH 21.4 (L) 11/06/2020   Lab Results  Component Value Date   WBC 6.1 11/06/2020   HGB 13.0 11/06/2020   HCT 38.8 11/06/2020   MCV 87 11/06/2020   PLT 299 11/06/2020   Attestation Statements:   Reviewed by clinician on day of visit: allergies, medications, problem list, medical history, surgical history, family history, social history, and previous encounter notes.  [name] Ceo, CMA, am acting as transcriptionist for [employer], PA-C.  I have reviewed the above documentation for accuracy and completeness, and I agree with the above. Abby Potash, PA-C

## 2021-09-12 ENCOUNTER — Ambulatory Visit (INDEPENDENT_AMBULATORY_CARE_PROVIDER_SITE_OTHER): Payer: BC Managed Care – PPO | Admitting: Family Medicine

## 2021-09-12 ENCOUNTER — Encounter (INDEPENDENT_AMBULATORY_CARE_PROVIDER_SITE_OTHER): Payer: Self-pay

## 2021-09-12 IMAGING — MG MM DIGITAL SCREENING BILAT W/ TOMO AND CAD
8 series · 8 of 24 positions shown · non-contrast
Comparison: Previous exam(s).

CLINICAL DATA: Screening.

EXAM:
DIGITAL SCREENING BILATERAL MAMMOGRAM WITH TOMOSYNTHESIS AND CAD
TECHNIQUE: Bilateral screening digital craniocaudal and mediolateral oblique
mammograms were obtained. Bilateral screening digital breast
tomosynthesis was performed. The images were evaluated with
computer-aided detection.

[L MLO synth-2D]
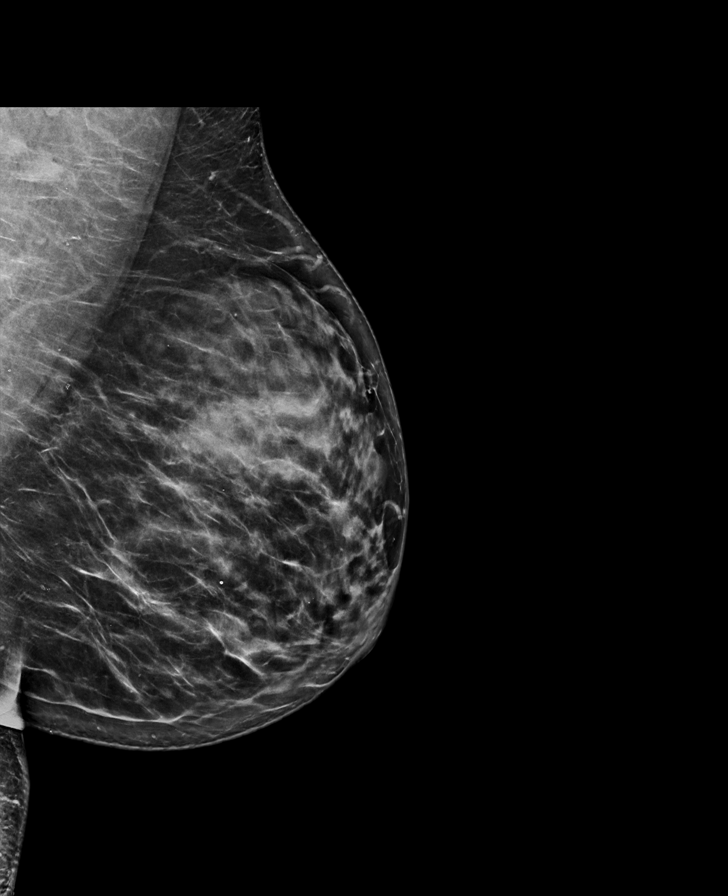

[L CC synth-2D]
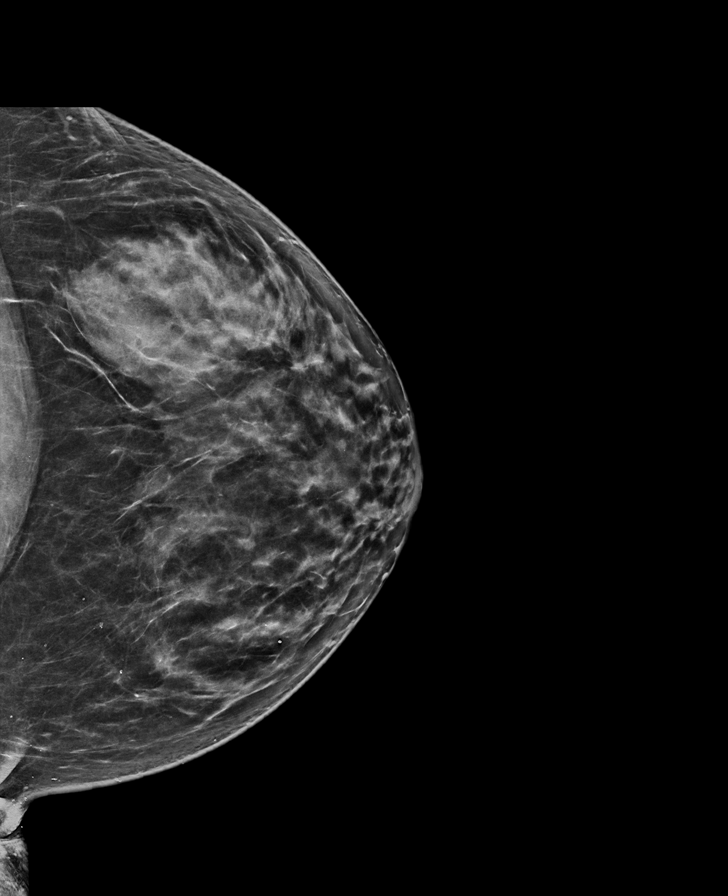

[R MLO synth-2D]
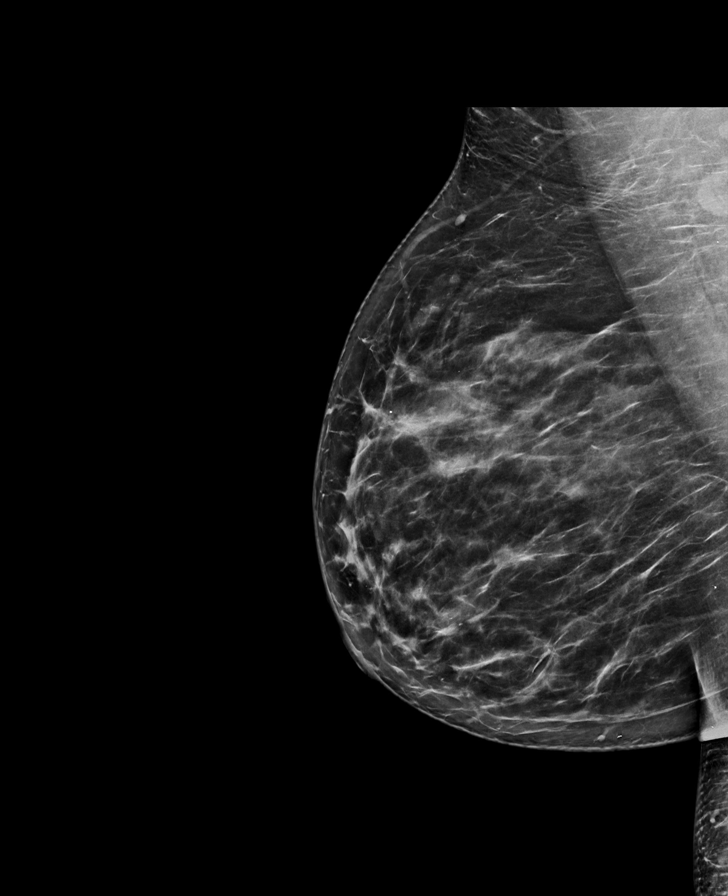

[R CC synth-2D]
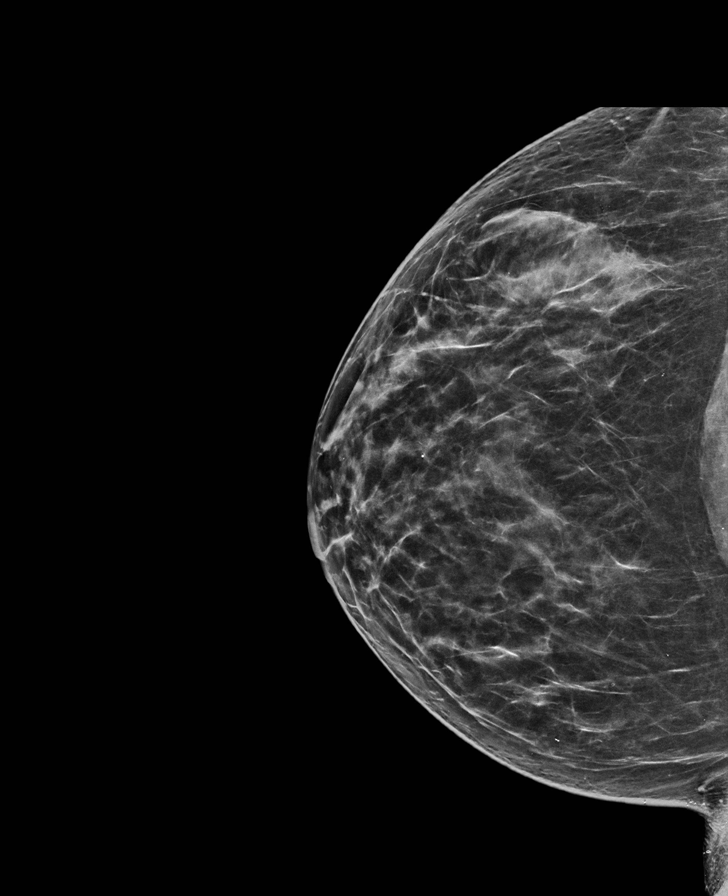

[L CC tomo · tomo slice 37/72.0]
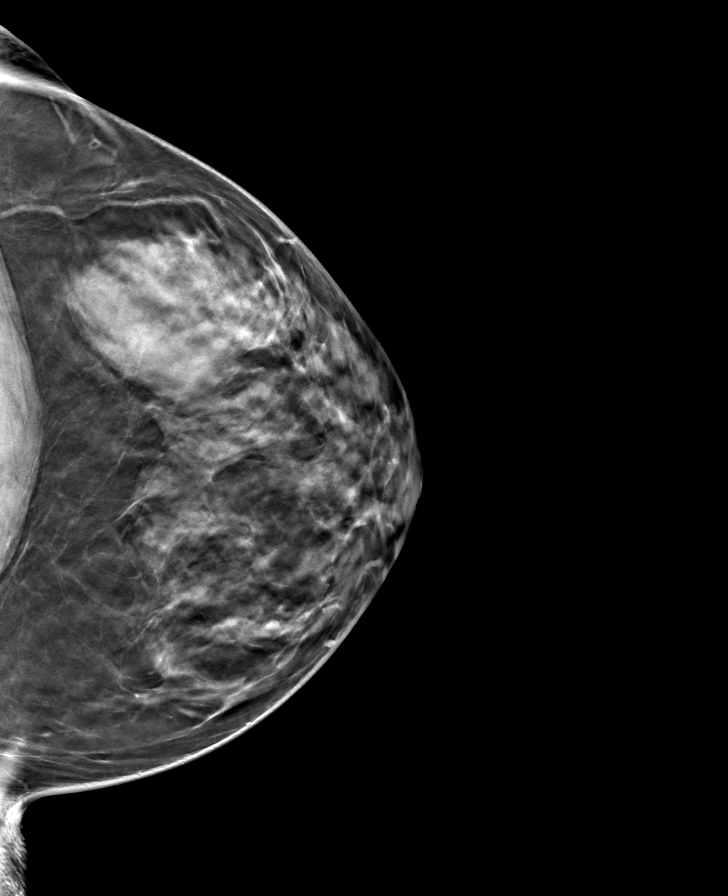

[L MLO tomo · tomo slice 40/79.0]
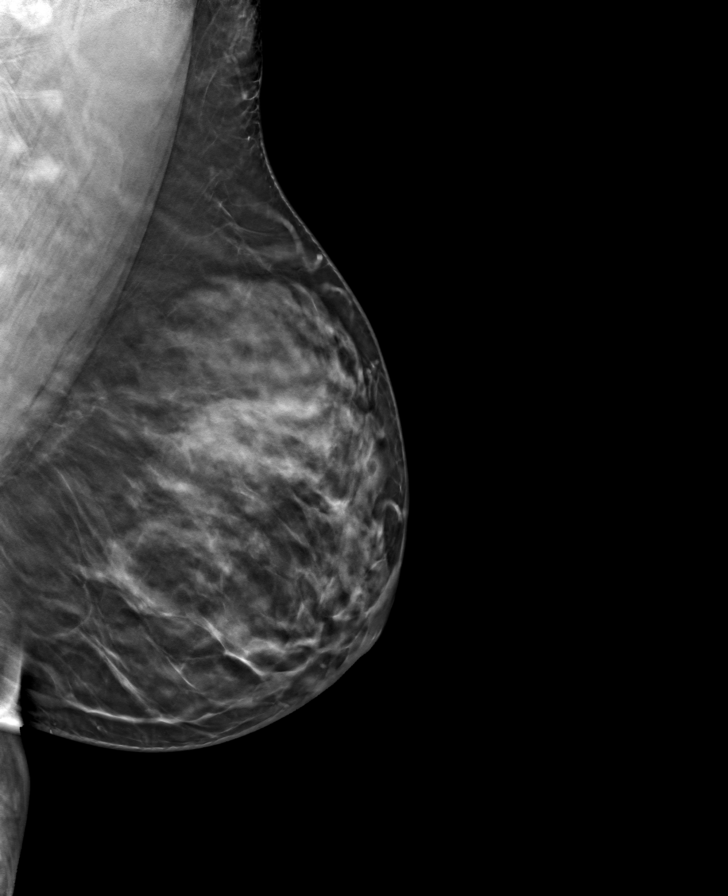

[R MLO tomo · tomo slice 41/81.0]
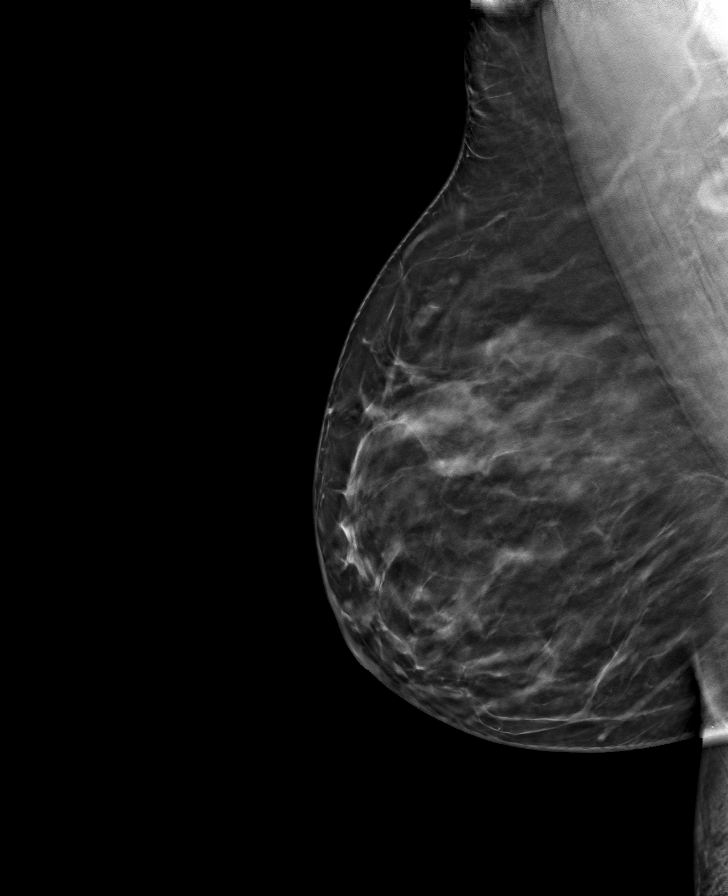

[R CC tomo · tomo slice 35/70.0]
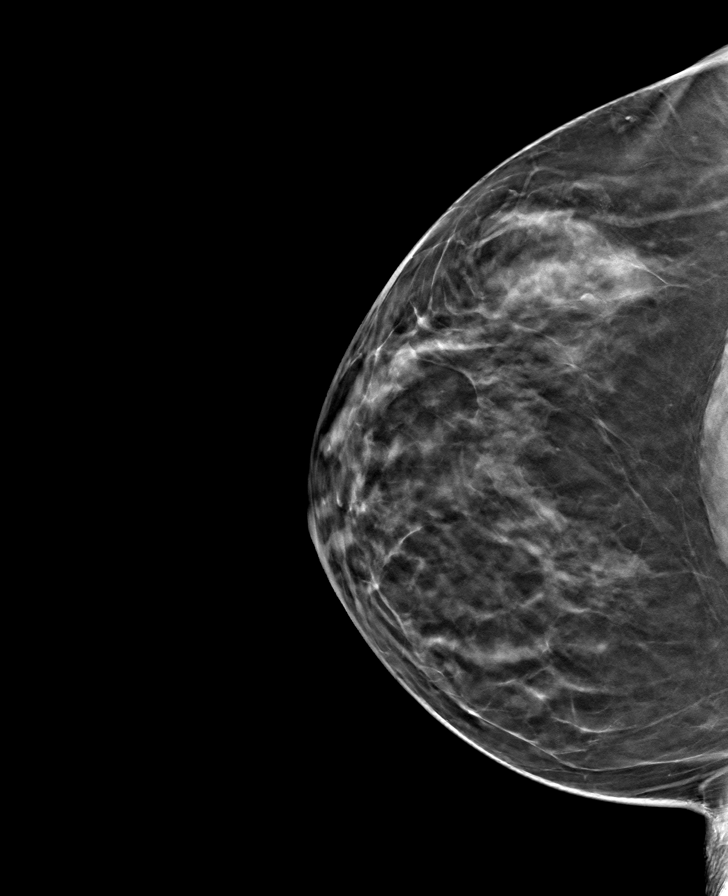

[8 of 24 positions shown; findings below may reference images not displayed]

ACR Breast Density Category c: The breast tissue is heterogeneously
dense, which may obscure small masses.
FINDINGS: In the right breast, calcifications warrant further evaluation with
magnified views. In the left breast, no findings suspicious for
malignancy.
IMPRESSION: Further evaluation is suggested for calcifications in the right
breast.

RECOMMENDATION:
Diagnostic mammogram of the right breast. (Code:00-[id number])

The patient will be contacted regarding the findings, and additional
imaging will be scheduled.

BI-RADS CATEGORY  0: Incomplete. Need additional imaging evaluation
and/or prior mammograms for comparison.

## 2021-09-15 LAB — HM PAP SMEAR: CHL HPV: NEGATIVE

## 2021-09-24 ENCOUNTER — Other Ambulatory Visit: Payer: Self-pay

## 2021-09-24 ENCOUNTER — Encounter (INDEPENDENT_AMBULATORY_CARE_PROVIDER_SITE_OTHER): Payer: Self-pay | Admitting: Physician Assistant

## 2021-09-24 ENCOUNTER — Ambulatory Visit (INDEPENDENT_AMBULATORY_CARE_PROVIDER_SITE_OTHER): Payer: BC Managed Care – PPO | Admitting: Physician Assistant

## 2021-09-24 VITALS — BP 106/73 | HR 83 | Temp 97.9°F | Ht 61.0 in | Wt 174.0 lb

## 2021-09-24 DIAGNOSIS — E038 Other specified hypothyroidism: Secondary | ICD-10-CM

## 2021-09-24 DIAGNOSIS — Z6832 Body mass index (BMI) 32.0-32.9, adult: Secondary | ICD-10-CM

## 2021-09-24 DIAGNOSIS — E7849 Other hyperlipidemia: Secondary | ICD-10-CM

## 2021-09-24 DIAGNOSIS — Z9189 Other specified personal risk factors, not elsewhere classified: Secondary | ICD-10-CM | POA: Diagnosis not present

## 2021-09-24 DIAGNOSIS — F3289 Other specified depressive episodes: Secondary | ICD-10-CM

## 2021-09-24 DIAGNOSIS — E559 Vitamin D deficiency, unspecified: Secondary | ICD-10-CM | POA: Diagnosis not present

## 2021-09-24 DIAGNOSIS — Z6833 Body mass index (BMI) 33.0-33.9, adult: Secondary | ICD-10-CM

## 2021-09-24 DIAGNOSIS — E669 Obesity, unspecified: Secondary | ICD-10-CM

## 2021-09-24 MED ORDER — LEVOTHYROXINE SODIUM 75 MCG PO TABS: 75.0000 ug | ORAL_TABLET | Freq: Every day | ORAL | 0 refills | Status: DC

## 2021-09-24 MED ORDER — BUPROPION HCL ER (SR) 150 MG PO TB12: 150.0000 mg | ORAL_TABLET | Freq: Every day | ORAL | 0 refills | Status: DC

## 2021-09-24 MED ORDER — VITAMIN D (ERGOCALCIFEROL) 1.25 MG (50000 UNIT) PO CAPS: ORAL_CAPSULE | ORAL | 0 refills | Status: DC

## 2021-09-24 MED ORDER — GEMFIBROZIL 600 MG PO TABS: 600.0000 mg | ORAL_TABLET | Freq: Two times a day (BID) | ORAL | 0 refills | Status: DC

## 2021-09-24 NOTE — Progress Notes (Signed)
Chief Complaint:   OBESITY Betty Porter is here to discuss her progress with her obesity treatment plan along with follow-up of her obesity related diagnoses. Betty Porter is on the Category 3 Plan and states she is following her eating plan approximately 65-70% of the time. Betty Porter states she is doing Crossfit 60 minutes 1 times per week.  Today's visit was #: 11 Starting weight: 178 lbs Starting date: 11/06/2020 Today's weight: 174 lbs Today's date: 09/24/2021 Total lbs lost to date: 4 Total lbs lost since last in-office visit: 1  Interim History: Pt continues to crave sweets. She is eating a lot of chocolate and therefore is overeating her calories. Her husband passed away in 2023/07/28 and she is doing a lot of emotional eating.  Subjective:   1. Vitamin D deficiency Pt is on Vit D weekly.  2. Other specified hypothyroidism She is on levothyroxine and reports fatigue.  3. Other hyperlipidemia Pt takes Lopid and is tolerating it well.  4. Other depression with emotional eating She was started on Wellbutrin 2 weeks ago and continues to have cravings. Her mood continues to be low.  5. At risk for osteoporosis Betty Porter is at higher risk of osteopenia and osteoporosis due to Vitamin D deficiency.   Assessment/Plan:   1. Vitamin D deficiency Low Vitamin D level contributes to fatigue and are associated with obesity, breast, and colon cancer. She agrees to continue to take prescription Vitamin D 50,000 IU every week and will follow-up for routine testing of Vitamin D, at least 2-3 times per year to avoid over-replacement.  Refill- Vitamin D, Ergocalciferol, (DRISDOL) 1.25 MG (50000 UNIT) CAPS capsule; TAKE ONE CAPSULE BY MOUTH ONCE WEEKLY (EVERY 7 DAYS)  Dispense: 4 capsule; Refill: 0  2. Other specified hypothyroidism Patient with long-standing hypothyroidism, on levothyroxine therapy. She appears euthyroid. Orders and follow up as documented in patient record.  Counseling Good  thyroid control is important for overall health. Supratherapeutic thyroid levels are dangerous and will not improve weight loss results. The correct way to take levothyroxine is fasting, with water, separated by at least 30 minutes from breakfast, and separated by more than 4 hours from calcium, iron, multivitamins, acid reflux medications (PPIs).   Refill- levothyroxine (SYNTHROID) 75 MCG tablet; Take 1 tablet (75 mcg total) by mouth daily before breakfast.  Dispense: 30 tablet; Refill: 0  3. Other hyperlipidemia Cardiovascular risk and specific lipid/LDL goals reviewed.  We discussed several lifestyle modifications today and Betty Porter will continue to work on diet, exercise and weight loss efforts. Orders and follow up as documented in patient record.   Counseling Intensive lifestyle modifications are the first line treatment for this issue. Dietary changes: Increase soluble fiber. Decrease simple carbohydrates. Exercise changes: Moderate to vigorous-intensity aerobic activity 150 minutes per week if tolerated. Lipid-lowering medications: see documented in medical record.  Refill- gemfibrozil (LOPID) 600 MG tablet; Take 1 tablet (600 mg total) by mouth in the morning and at bedtime.  Dispense: 60 tablet; Refill: 0  4. Other depression with emotional eating Behavior modification techniques were discussed today to help Betty Porter deal with her emotional/non-hunger eating behaviors.  Orders and follow up as documented in patient record. Consider increasing Wellbutrin dose at next visit.  Refill- buPROPion (WELLBUTRIN SR) 150 MG 12 hr tablet; Take 1 tablet (150 mg total) by mouth daily.  Dispense: 30 tablet; Refill: 0  5. At risk for osteoporosis Betty Porter was given approximately 15 minutes of osteoporosis prevention counseling today. Betty Porter is at risk for osteopenia and  osteoporosis due to her Vitamin D deficiency. She was encouraged to take her Vit D and follow her higher calcium diet and increase  strengthening exercise to help strengthen her bones and decrease her risk of osteopenia and osteoporosis.  6. Obesity with current BMI 32.89 Betty Porter is currently in the action stage of change. As such, her goal is to continue with weight loss efforts. She has agreed to the Category 2 Plan + 8 oz milk.   Labs at next visit.  Exercise goals:  As is  Behavioral modification strategies: increasing lean protein intake and decreasing simple carbohydrates.  Betty Porter has agreed to follow-up with our clinic in 2 weeks. She was informed of the importance of frequent follow-up visits to maximize her success with intensive lifestyle modifications for her multiple health conditions.   Objective:   Blood pressure 106/73, pulse 83, temperature 97.9 F (36.6 C), height 5\' 1"  (1.549 m), weight 174 lb (78.9 kg), SpO2 98 %. Body mass index is 32.88 kg/m.  General: Cooperative, alert, well developed, in no acute distress. HEENT: Conjunctivae and lids unremarkable. Cardiovascular: Regular rhythm.  Lungs: Normal work of breathing. Neurologic: No focal deficits.   Lab Results  Component Value Date   CREATININE 1.23 (H) 03/04/2021   BUN 21 03/04/2021   NA 140 03/04/2021   K 4.6 03/04/2021   CL 105 03/04/2021   CO2 23 03/04/2021   Lab Results  Component Value Date   ALT 14 03/04/2021   AST 18 03/04/2021   ALKPHOS 79 03/04/2021   BILITOT 0.2 03/04/2021   Lab Results  Component Value Date   HGBA1C 5.4 03/04/2021   HGBA1C 5.7 (H) 11/06/2020   Lab Results  Component Value Date   INSULIN 14.2 03/04/2021   INSULIN 11.9 11/06/2020   Lab Results  Component Value Date   TSH 4.690 (H) 03/04/2021   Lab Results  Component Value Date   CHOL 251 (H) 03/04/2021   HDL 49 03/04/2021   LDLCALC 163 (H) 03/04/2021   TRIG 214 (H) 03/04/2021   Lab Results  Component Value Date   VD25OH 29.5 (L) 03/04/2021   VD25OH 21.4 (L) 11/06/2020   Lab Results  Component Value Date   WBC 6.1 11/06/2020    HGB 13.0 11/06/2020   HCT 38.8 11/06/2020   MCV 87 11/06/2020   PLT 299 11/06/2020    Attestation Statements:   Reviewed by clinician on day of visit: allergies, medications, problem list, medical history, surgical history, family history, social history, and previous encounter notes.  [name] Ceo, CMA, am acting as transcriptionist for [employer], PA-C.  I have reviewed the above documentation for accuracy and completeness, and I agree with the above. Abby Potash, PA-C

## 2021-10-10 ENCOUNTER — Ambulatory Visit (INDEPENDENT_AMBULATORY_CARE_PROVIDER_SITE_OTHER): Payer: BC Managed Care – PPO | Admitting: Bariatrics

## 2021-10-10 ENCOUNTER — Encounter (INDEPENDENT_AMBULATORY_CARE_PROVIDER_SITE_OTHER): Payer: Self-pay | Admitting: Bariatrics

## 2021-10-10 ENCOUNTER — Other Ambulatory Visit: Payer: Self-pay

## 2021-10-10 VITALS — BP 99/66 | HR 73 | Temp 97.8°F | Ht 61.0 in | Wt 178.0 lb

## 2021-10-10 DIAGNOSIS — E038 Other specified hypothyroidism: Secondary | ICD-10-CM

## 2021-10-10 DIAGNOSIS — E8881 Metabolic syndrome: Secondary | ICD-10-CM | POA: Diagnosis not present

## 2021-10-10 DIAGNOSIS — E559 Vitamin D deficiency, unspecified: Secondary | ICD-10-CM | POA: Diagnosis not present

## 2021-10-10 DIAGNOSIS — Z6833 Body mass index (BMI) 33.0-33.9, adult: Secondary | ICD-10-CM

## 2021-10-10 DIAGNOSIS — R7989 Other specified abnormal findings of blood chemistry: Secondary | ICD-10-CM

## 2021-10-10 DIAGNOSIS — E7849 Other hyperlipidemia: Secondary | ICD-10-CM

## 2021-10-10 DIAGNOSIS — E669 Obesity, unspecified: Secondary | ICD-10-CM

## 2021-10-10 DIAGNOSIS — F3289 Other specified depressive episodes: Secondary | ICD-10-CM

## 2021-10-10 MED ORDER — BUPROPION HCL ER (SR) 200 MG PO TB12: 200.0000 mg | ORAL_TABLET | Freq: Every day | ORAL | 0 refills | Status: DC

## 2021-10-10 MED ORDER — LEVOTHYROXINE SODIUM 75 MCG PO TABS: 75.0000 ug | ORAL_TABLET | Freq: Every day | ORAL | 0 refills | Status: DC

## 2021-10-10 MED ORDER — GEMFIBROZIL 600 MG PO TABS: 600.0000 mg | ORAL_TABLET | Freq: Two times a day (BID) | ORAL | 0 refills | Status: DC

## 2021-10-10 MED ORDER — VITAMIN D (ERGOCALCIFEROL) 1.25 MG (50000 UNIT) PO CAPS: ORAL_CAPSULE | ORAL | 0 refills | Status: DC

## 2021-10-10 NOTE — Progress Notes (Signed)
? ? ? ?Chief Complaint:  ? ?OBESITY ?Betty Porter is here to discuss her progress with her obesity treatment plan along with follow-up of her obesity related diagnoses. Betty Porter is on the Category 2 Plan plus 80 grams of milk and states she is following her eating plan approximately 60% of the time. Betty Porter states she is doing CrossFit  for 60 minutes 2 times per week. ? ?Today's visit was #: 12 ?Starting weight: 178 lbs ?Starting date: 11/06/2020 ?Today's weight: 178 lbs ?Today's date: 10/10/2021 ?Total lbs lost to date: 0 ?Total lbs lost since last in-office visit: 0 ? ?Interim History: Betty Porter is up 4 lbs since her last visit. She states that "it is hard to say no". She has had more celebrations.  ? ?Subjective:  ? ?1. Other hyperlipidemia ?Betty Porter is taking her medications as directed.  ? ?2. Other specified hypothyroidism ?Betty Porter is taking her medications as directed.  ? ?3. Vitamin D deficiency ?Betty Porter is taking Vitamin D as directed.  ? ?4. Elevated TSH ?Betty Porter's last TSH level was 4.690 on 03/04/2021. ? ?5. Insulin resistance ?Betty Porter's last insulin resistance level on 03/04/2021 was 14.2 ? ?6. Other depression with emotional eating ?Betty Porter notes night time eating.  ? ?Assessment/Plan:  ? ?1. Other hyperlipidemia ?Cardiovascular risk and specific lipid/LDL goals reviewed.  We will refill Lopid 600 mg for 1 month with no refills. We discussed several lifestyle modifications today and Kately will continue to work on diet, exercise and weight loss efforts. Orders and follow up as documented in patient record.  ? ?Counseling ?Intensive lifestyle modifications are the first line treatment for this issue. ?Dietary changes: Increase soluble fiber. Decrease simple carbohydrates. ?Exercise changes: Moderate to vigorous-intensity aerobic activity 150 minutes per week if tolerated. ?Lipid-lowering medications: see documented in medical record. ? ?- gemfibrozil (LOPID) 600 MG tablet; Take 1 tablet (600 mg total) by mouth in  the morning and at bedtime.  Dispense: 60 tablet; Refill: 0 ? ?2. Other specified hypothyroidism ?We will refill Synthroid 75 mg for 1 month with no refills. Orders and follow up as documented in patient record. ? ?Counseling ?Good thyroid control is important for overall health. Supratherapeutic thyroid levels are dangerous and will not improve weight loss results. ?Counseling: The correct way to take levothyroxine is fasting, with water, separated by at least 30 minutes from breakfast, and separated by more than 4 hours from calcium, iron, multivitamins, acid reflux medications (PPIs).   ? ?- levothyroxine (SYNTHROID) 75 MCG tablet; Take 1 tablet (75 mcg total) by mouth daily before breakfast.  Dispense: 30 tablet; Refill: 0 ? ?3. Vitamin D deficiency ?Low Vitamin D level contributes to fatigue and are associated with obesity, breast, and colon cancer. We will refill prescription Vitamin D 50,000 IU every week for 1 month with no refills and Romaine will follow-up for routine testing of Vitamin D, at least 2-3 times per year to avoid over-replacement. ? ?- Vitamin D, Ergocalciferol, (DRISDOL) 1.25 MG (50000 UNIT) CAPS capsule; TAKE ONE CAPSULE BY MOUTH ONCE WEEKLY (EVERY 7 DAYS)  Dispense: 4 capsule; Refill: 0 ? ?4. Elevated TSH ?We will check TSH today.  ? ?5. Insulin resistance ?Torianne will continue to work on weight loss, exercise, and decreasing simple carbohydrates to help decrease the risk of diabetes. [place] agreed to follow-up with [place] as directed to closely monitor her progress. ? ?6. Other depression with emotional eating ?We will refill Wellbutrin SR 200 mg for 1 month with no refills. Behavior modification techniques were discussed today to help [facility]  deal with her emotional/non-hunger eating behaviors.  Orders and follow up as documented in patient record.  ? ?- buPROPion (WELLBUTRIN SR) 200 MG 12 hr tablet; Take 1 tablet (200 mg total) by mouth daily.  Dispense: 30 tablet; Refill: 0 ? ?7. Obesity,  with current BMI of 33.7 ?Betty Porter is currently in the action stage of change. As such, her goal is to continue with weight loss efforts. She has agreed to the Category 2 Plan plus 8 grams of milk.  ? ?Betty Porter will continue to increase protein.  ? ?Exercise goals:  As is. ? ?Behavioral modification strategies: increasing lean protein intake, decreasing simple carbohydrates, increasing vegetables, increasing water intake, decreasing eating out, no skipping meals, meal planning and cooking strategies, keeping healthy foods in the home, and planning for success. ? ?Betty Porter has agreed to follow-up with our clinic in 3 weeks with Dr. Leafy Ro or Abby Potash, PA-C. She was informed of the importance of frequent follow-up visits to maximize her success with intensive lifestyle modifications for her multiple health conditions.  ? ?Betty Porter was informed we would discuss her lab results at her next visit unless there is a critical issue that needs to be addressed sooner. Betty Porter agreed to keep her next visit at the agreed upon time to discuss these results. ? ?Objective:  ? ?Blood pressure 99/66, pulse 73, temperature 97.8 ?F (36.6 ?C), height 5\' 1"  (1.549 m), weight 178 lb (80.7 kg), SpO2 98 %. ?Body mass index is 33.63 kg/m?. ? ?General: Cooperative, alert, well developed, in no acute distress. ?HEENT: Conjunctivae and lids unremarkable. ?Cardiovascular: Regular rhythm.  ?Lungs: Normal work of breathing. ?Neurologic: No focal deficits.  ? ?Lab Results  ?Component Value Date  ? CREATININE 1.23 (H) 03/04/2021  ? BUN 21 03/04/2021  ? NA 140 03/04/2021  ? K 4.6 03/04/2021  ? CL 105 03/04/2021  ? CO2 23 03/04/2021  ? ?Lab Results  ?Component Value Date  ? ALT 14 03/04/2021  ? AST 18 03/04/2021  ? ALKPHOS 79 03/04/2021  ? BILITOT 0.2 03/04/2021  ? ?Lab Results  ?Component Value Date  ? HGBA1C 5.4 03/04/2021  ? HGBA1C 5.7 (H) 11/06/2020  ? ?Lab Results  ?Component Value Date  ? INSULIN 14.2 03/04/2021  ? INSULIN 11.9 11/06/2020   ? ?Lab Results  ?Component Value Date  ? TSH 4.690 (H) 03/04/2021  ? ?Lab Results  ?Component Value Date  ? CHOL 251 (H) 03/04/2021  ? HDL 49 03/04/2021  ? LDLCALC 163 (H) 03/04/2021  ? TRIG 214 (H) 03/04/2021  ? ?Lab Results  ?Component Value Date  ? VD25OH 29.5 (L) 03/04/2021  ? VD25OH 21.4 (L) 11/06/2020  ? ?Lab Results  ?Component Value Date  ? WBC 6.1 11/06/2020  ? HGB 13.0 11/06/2020  ? HCT 38.8 11/06/2020  ? MCV 87 11/06/2020  ? PLT 299 11/06/2020  ? ?No results found for: IRON, TIBC, FERRITIN ? ?Attestation Statements:  ? ?Reviewed by clinician on day of visit: allergies, medications, problem list, medical history, surgical history, family history, social history, and previous encounter notes. ? ?I, Lizbeth Bark, RMA, am acting as transcriptionist for [employer], DO. ? ?I have reviewed the above documentation for accuracy and completeness, and I agree with the above. Jearld Lesch, DO ? ?

## 2021-10-11 LAB — LIPID PANEL WITH LDL/HDL RATIO
Cholesterol, Total: 246 mg/dL — ABNORMAL HIGH (ref 100–199)
HDL: 50 mg/dL (ref >39)
LDL Chol Calc (NIH): 164 mg/dL — ABNORMAL HIGH (ref 0–99)
LDL/HDL Ratio: 3.3 ratio — ABNORMAL HIGH (ref 0.0–3.2)
Triglycerides: 175 mg/dL — ABNORMAL HIGH (ref 0–149)
VLDL Cholesterol Cal: 32 mg/dL (ref 5–40)

## 2021-10-11 LAB — TSH+T4F+T3FREE
Free T4: 1.07 ng/dL (ref 0.82–1.77)
T3, Free: 2.2 pg/mL (ref 2.0–4.4)
TSH: 2.07 u[IU]/mL (ref 0.450–4.500)

## 2021-10-11 LAB — COMPREHENSIVE METABOLIC PANEL
ALT: 18 IU/L (ref 0–32)
AST: 18 IU/L (ref 0–40)
Albumin/Globulin Ratio: 1.8 (ref 1.2–2.2)
Albumin: 4.7 g/dL (ref 3.8–4.8)
Alkaline Phosphatase: 69 IU/L (ref 44–121)
BUN/Creatinine Ratio: 20 (ref 9–23)
BUN: 18 mg/dL (ref 6–24)
Bilirubin Total: 0.2 mg/dL (ref 0.0–1.2)
CO2: 20 mmol/L (ref 20–29)
Calcium: 9.3 mg/dL (ref 8.7–10.2)
Chloride: 104 mmol/L (ref 96–106)
Creatinine, Ser: 0.9 mg/dL (ref 0.57–1.00)
Globulin, Total: 2.6 g/dL (ref 1.5–4.5)
Glucose: 99 mg/dL (ref 70–99)
Potassium: 4.6 mmol/L (ref 3.5–5.2)
Sodium: 139 mmol/L (ref 134–144)
Total Protein: 7.3 g/dL (ref 6.0–8.5)
eGFR: 78 mL/min/{1.73_m2} (ref >59)

## 2021-10-11 LAB — HEMOGLOBIN A1C
Est. average glucose Bld gHb Est-mCnc: 114 mg/dL
Hgb A1c MFr Bld: 5.6 % (ref 4.8–5.6)

## 2021-10-11 LAB — INSULIN, RANDOM: INSULIN: 13.3 u[IU]/mL (ref 2.6–24.9)

## 2021-10-11 LAB — VITAMIN D 25 HYDROXY (VIT D DEFICIENCY, FRACTURES): Vit D, 25-Hydroxy: 22.4 ng/mL — ABNORMAL LOW (ref 30.0–100.0)

## 2021-10-13 ENCOUNTER — Encounter (INDEPENDENT_AMBULATORY_CARE_PROVIDER_SITE_OTHER): Payer: Self-pay | Admitting: Bariatrics

## 2021-10-22 ENCOUNTER — Ambulatory Visit (INDEPENDENT_AMBULATORY_CARE_PROVIDER_SITE_OTHER): Payer: BC Managed Care – PPO | Admitting: Physician Assistant

## 2021-11-05 ENCOUNTER — Encounter (INDEPENDENT_AMBULATORY_CARE_PROVIDER_SITE_OTHER): Payer: Self-pay | Admitting: Family Medicine

## 2021-11-05 ENCOUNTER — Other Ambulatory Visit: Payer: Self-pay

## 2021-11-05 ENCOUNTER — Ambulatory Visit (INDEPENDENT_AMBULATORY_CARE_PROVIDER_SITE_OTHER): Payer: BC Managed Care – PPO | Admitting: Family Medicine

## 2021-11-05 VITALS — BP 116/78 | HR 71 | Temp 97.9°F | Ht 61.0 in | Wt 177.0 lb

## 2021-11-05 DIAGNOSIS — E782 Mixed hyperlipidemia: Secondary | ICD-10-CM | POA: Diagnosis not present

## 2021-11-05 DIAGNOSIS — E559 Vitamin D deficiency, unspecified: Secondary | ICD-10-CM | POA: Diagnosis not present

## 2021-11-05 DIAGNOSIS — E7849 Other hyperlipidemia: Secondary | ICD-10-CM

## 2021-11-05 DIAGNOSIS — Z6833 Body mass index (BMI) 33.0-33.9, adult: Secondary | ICD-10-CM

## 2021-11-05 DIAGNOSIS — F3289 Other specified depressive episodes: Secondary | ICD-10-CM | POA: Diagnosis not present

## 2021-11-05 DIAGNOSIS — E669 Obesity, unspecified: Secondary | ICD-10-CM

## 2021-11-05 DIAGNOSIS — E038 Other specified hypothyroidism: Secondary | ICD-10-CM | POA: Diagnosis not present

## 2021-11-05 MED ORDER — VITAMIN D (ERGOCALCIFEROL) 1.25 MG (50000 UNIT) PO CAPS: ORAL_CAPSULE | ORAL | 0 refills | Status: DC

## 2021-11-05 MED ORDER — LEVOTHYROXINE SODIUM 75 MCG PO TABS: 75.0000 ug | ORAL_TABLET | Freq: Every day | ORAL | 0 refills | Status: DC

## 2021-11-05 MED ORDER — GEMFIBROZIL 600 MG PO TABS: 600.0000 mg | ORAL_TABLET | Freq: Two times a day (BID) | ORAL | 0 refills | Status: DC

## 2021-11-05 MED ORDER — BUPROPION HCL ER (SR) 200 MG PO TB12: 200.0000 mg | ORAL_TABLET | Freq: Every day | ORAL | 0 refills | Status: DC

## 2021-11-06 MED ORDER — ATORVASTATIN CALCIUM 10 MG PO TABS: 10.0000 mg | ORAL_TABLET | Freq: Every day | ORAL | 0 refills | Status: DC

## 2021-11-06 NOTE — Progress Notes (Signed)
? ? ? ?Chief Complaint:  ? ?OBESITY ?Betty Porter is here to discuss her progress with her obesity treatment plan along with follow-up of her obesity related diagnoses. Betty Porter is on the Category 2 Plan and states she is following her eating plan approximately 50% of the time. Betty Porter states she is at the gym and doing cross fit for 60 minutes 4 times per week. ? ?Today's visit was #: 13 ?Starting weight: 178 lbs ?Starting date: 11/06/2020 ?Today's weight: 177 lbs ?Today's date: 11/05/2021 ?Total lbs lost to date: 1 ?Total lbs lost since last in-office visit: 1 ? ?Interim History: Betty Porter has done well with weight loss in the last 3 weeks. She feels she is getting ready to get back to a structured plan. She has started back to exercising again. ? ?Subjective:  ? ?1. Mixed hyperlipidemia ?Betty Porter's last LDL and triglycerides are both elevated. She is not on a statin.  ? ?2. Vitamin D deficiency ?Betty Porter's last Vit D level was very low. She hasn't been on Vit D. ? ?3. Other specified hypothyroidism ?Betty Porter is on Synthroid, and her last labs were within normal limits. She denies palpitations or tremors.  ? ?4. Other depression with emotional eating ?Betty Porter is grieving the recent death of her husband. She hasn't been taking Wellbutrin regularly, but she is ready to start. She denies suicidal or homicidal ideations.  ? ?Assessment/Plan:  ? ?1. Mixed hyperlipidemia ?Betty Porter agreed to start Lipitor 10 mg qhs #30 with no refills, and we will refill gemfibrozil for 1 month. ? ?- gemfibrozil (LOPID) 600 MG tablet; Take 1 tablet (600 mg total) by mouth in the morning and at bedtime.  Dispense: 60 tablet; Refill: 0 ? ?2. Vitamin D deficiency ?Betty Porter will continue prescription Vitamin D, and we will refill for 1 month. ? ?- Vitamin D, Ergocalciferol, (DRISDOL) 1.25 MG (50000 UNIT) CAPS capsule; TAKE ONE CAPSULE BY MOUTH ONCE WEEKLY (EVERY 7 DAYS)  Dispense: 4 capsule; Refill: 0 ? ?3. Other specified hypothyroidism ?We will refill  Synthroid for 1 month. Orders and follow up as documented in patient record. ? ?- levothyroxine (SYNTHROID) 75 MCG tablet; Take 1 tablet (75 mcg total) by mouth daily before breakfast.  Dispense: 30 tablet; Refill: 0 ? ?4. Other depression with emotional eating ?We will refill Wellbutrin SR for 1 month. Behavior modification techniques were discussed today to help Betty Porter deal with her emotional/non-hunger eating behaviors.  Orders and follow up as documented in patient record.  ? ?- buPROPion (WELLBUTRIN SR) 200 MG 12 hr tablet; Take 1 tablet (200 mg total) by mouth daily.  Dispense: 30 tablet; Refill: 0 ? ?5. Obesity, with current BMI of 33.5 ?Betty Porter is currently in the action stage of change. As such, her goal is to continue with weight loss efforts. She has agreed to the Category 2 Plan add melon options.  ? ?Exercise goals: As is. ? ?Behavioral modification strategies: increasing lean protein intake and no skipping meals. ? ?Betty Porter has agreed to follow-up with our clinic in 3 weeks. She was informed of the importance of frequent follow-up visits to maximize her success with intensive lifestyle modifications for her multiple health conditions.  ? ?Objective:  ? ?Blood pressure 116/78, pulse 71, temperature 97.9 ?F (36.6 ?C), height '5\' 1"'$  (1.549 m), weight 177 lb (80.3 kg), SpO2 99 %. ?Body mass index is 33.44 kg/m?. ? ?General: Cooperative, alert, well developed, in no acute distress. ?HEENT: Conjunctivae and lids unremarkable. ?Cardiovascular: Regular rhythm.  ?Lungs: Normal work of breathing. ?Neurologic: No focal deficits.  ? ?  Lab Results  ?Component Value Date  ? CREATININE 0.90 10/10/2021  ? BUN 18 10/10/2021  ? NA 139 10/10/2021  ? K 4.6 10/10/2021  ? CL 104 10/10/2021  ? CO2 20 10/10/2021  ? ?Lab Results  ?Component Value Date  ? ALT 18 10/10/2021  ? AST 18 10/10/2021  ? ALKPHOS 69 10/10/2021  ? BILITOT 0.2 10/10/2021  ? ?Lab Results  ?Component Value Date  ? HGBA1C 5.6 10/10/2021  ? HGBA1C 5.4  03/04/2021  ? HGBA1C 5.7 (H) 11/06/2020  ? ?Lab Results  ?Component Value Date  ? INSULIN 13.3 10/10/2021  ? INSULIN 14.2 03/04/2021  ? INSULIN 11.9 11/06/2020  ? ?Lab Results  ?Component Value Date  ? TSH 2.070 10/10/2021  ? ?Lab Results  ?Component Value Date  ? CHOL 246 (H) 10/10/2021  ? HDL 50 10/10/2021  ? LDLCALC 164 (H) 10/10/2021  ? TRIG 175 (H) 10/10/2021  ? ?Lab Results  ?Component Value Date  ? VD25OH 22.4 (L) 10/10/2021  ? VD25OH 29.5 (L) 03/04/2021  ? VD25OH 21.4 (L) 11/06/2020  ? ?Lab Results  ?Component Value Date  ? WBC 6.1 11/06/2020  ? HGB 13.0 11/06/2020  ? HCT 38.8 11/06/2020  ? MCV 87 11/06/2020  ? PLT 299 11/06/2020  ? ?No results found for: IRON, TIBC, FERRITIN ? ?Attestation Statements:  ? ?Reviewed by clinician on day of visit: allergies, medications, problem list, medical history, surgical history, family history, social history, and previous encounter notes. ? ? ?I, Trixie Dredge, am acting as transcriptionist for Dennard Nip, MD. ? ?I have reviewed the above documentation for accuracy and completeness, and I agree with the above. -  Dennard Nip, MD ? ? ?

## 2021-11-25 ENCOUNTER — Encounter (INDEPENDENT_AMBULATORY_CARE_PROVIDER_SITE_OTHER): Payer: Self-pay

## 2021-11-26 ENCOUNTER — Encounter (INDEPENDENT_AMBULATORY_CARE_PROVIDER_SITE_OTHER): Payer: Self-pay

## 2021-11-26 ENCOUNTER — Ambulatory Visit (INDEPENDENT_AMBULATORY_CARE_PROVIDER_SITE_OTHER): Payer: BC Managed Care – PPO | Admitting: Physician Assistant

## 2021-11-27 ENCOUNTER — Ambulatory Visit (INDEPENDENT_AMBULATORY_CARE_PROVIDER_SITE_OTHER): Payer: BC Managed Care – PPO | Admitting: Family Medicine

## 2021-11-27 ENCOUNTER — Encounter (INDEPENDENT_AMBULATORY_CARE_PROVIDER_SITE_OTHER): Payer: Self-pay | Admitting: Family Medicine

## 2021-11-27 VITALS — BP 109/70 | HR 85 | Temp 98.3°F | Ht 61.0 in | Wt 180.0 lb

## 2021-11-27 DIAGNOSIS — F3289 Other specified depressive episodes: Secondary | ICD-10-CM

## 2021-11-27 DIAGNOSIS — E782 Mixed hyperlipidemia: Secondary | ICD-10-CM

## 2021-11-27 DIAGNOSIS — E559 Vitamin D deficiency, unspecified: Secondary | ICD-10-CM

## 2021-11-27 DIAGNOSIS — E66811 Obesity, class 1: Secondary | ICD-10-CM

## 2021-11-27 DIAGNOSIS — E038 Other specified hypothyroidism: Secondary | ICD-10-CM

## 2021-11-27 DIAGNOSIS — Z6834 Body mass index (BMI) 34.0-34.9, adult: Secondary | ICD-10-CM

## 2021-11-27 DIAGNOSIS — E669 Obesity, unspecified: Secondary | ICD-10-CM

## 2021-11-27 MED ORDER — LEVOTHYROXINE SODIUM 75 MCG PO TABS: 75.0000 ug | ORAL_TABLET | Freq: Every day | ORAL | 0 refills | Status: DC

## 2021-11-27 MED ORDER — ATORVASTATIN CALCIUM 10 MG PO TABS: 10.0000 mg | ORAL_TABLET | Freq: Every day | ORAL | 0 refills | Status: DC

## 2021-11-27 MED ORDER — BUPROPION HCL ER (SR) 200 MG PO TB12: 200.0000 mg | ORAL_TABLET | Freq: Every day | ORAL | 0 refills | Status: DC

## 2021-11-27 MED ORDER — GEMFIBROZIL 600 MG PO TABS: 600.0000 mg | ORAL_TABLET | Freq: Two times a day (BID) | ORAL | 0 refills | Status: DC

## 2021-11-27 MED ORDER — VITAMIN D (ERGOCALCIFEROL) 1.25 MG (50000 UNIT) PO CAPS: ORAL_CAPSULE | ORAL | 0 refills | Status: DC

## 2021-12-04 ENCOUNTER — Other Ambulatory Visit (INDEPENDENT_AMBULATORY_CARE_PROVIDER_SITE_OTHER): Payer: Self-pay | Admitting: Family Medicine

## 2021-12-04 DIAGNOSIS — E782 Mixed hyperlipidemia: Secondary | ICD-10-CM

## 2021-12-11 NOTE — Progress Notes (Signed)
? ? ? ?Chief Complaint:  ? ?OBESITY ?Betty Porter is here to discuss her progress with her obesity treatment plan along with follow-up of her obesity related diagnoses. Betty Porter is on the Category 2 Plan add melon options and states she is following her eating plan approximately 0% of the time. Betty Porter states she is doing 0 minutes 0 times per week. ? ?Today's visit was #: 14 ?Starting weight: 178 lbs ?Starting date: 11/06/2020 ?Today's weight: 180 lbs ?Today's date: 11/27/2021 ?Total lbs lost to date: 0 ?Total lbs lost since last in-office visit: 0 ? ?Interim History: Betty Porter was on vacation  and she did some celebration eating, but she is getting back on track. 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  ? ?Subjective:  ? ?1. Vitamin D deficiency ?Betty Porter is doing well on Vitamin D, with no side effects noted.  ? ?2. Other specified hypothyroidism ?Betty Porter is on Synthroid, and she requests a refill. Last labs were within normal limits.  ? ?3. Mixed hyperlipidemia ?Betty Porter is on medications and she is working on decreasing cholesterol in her diet. No side effects were noted.  ? ?4. Other depression with emotional eating ?Betty Porter continues to struggles with  some emotional eating behaviors. She is trying to portion control and make smarter choices.  ? ?Assessment/Plan:  ? ?1. Vitamin D deficiency ?We will refill prescription Vitamin D for 1 month. Betty Porter will follow-up for routine testing of Vitamin D, at least 2-3 times per year to avoid over-replacement. ? ?- Vitamin D, Ergocalciferol, (DRISDOL) 1.25 MG (50000 UNIT) CAPS capsule; TAKE ONE CAPSULE BY MOUTH ONCE WEEKLY (EVERY 7 DAYS)  Dispense: 4 capsule; Refill: 0 ? ?2. Other specified hypothyroidism ?We will refill Synthroid for 1 month. Orders and follow up as documented in patient record. ? ?- levothyroxine (SYNTHROID) 75 MCG tablet; Take 1 tablet (75 mcg total) by mouth daily before breakfast.  Dispense: 30  tablet; Refill: 0 ? ?3. Mixed hyperlipidemia ?We will refill Lipitor and gemfibrozil for 1 month. Betty Porter will continue to work on diet, exercise and weight loss efforts. Orders and follow up as documented in patient record.  ? ?- atorvastatin (LIPITOR) 10 MG tablet; Take 1 tablet (10 mg total) by mouth at bedtime.  Dispense: 30 tablet; Refill: 0 ?- gemfibrozil (LOPID) 600 MG tablet; Take 1 tablet (600 mg total) by mouth in the morning and at bedtime.  Dispense: 60 tablet; Refill: 0 ? ?4. Other depression with emotional eating ?We will Wellbutrin SR for 1 month. Behavior modification techniques were discussed today to help Betty Porter deal with her emotional/non-hunger eating behaviors.  Orders and follow up as documented in patient record.  ? ?- buPROPion (WELLBUTRIN SR) 200 MG 12 hr tablet; Take 1 tablet (200 mg total) by mouth daily.  Dispense: 30 tablet; Refill: 0 ? ?5. Obesity, current BMI 34.1 ?Betty Porter is currently in the action stage of change. As such, her goal is to continue with weight loss efforts. She has agreed to the Category 2 Plan.  ? ?Behavioral modification strategies: increasing lean protein intake. ? ?Betty Porter has agreed to follow-up with our clinic in 4 weeks. She was informed of the importance of frequent follow-up visits to maximize her success with intensive lifestyle modifications for her multiple health conditions.  ? ?Objective:  ? ?Blood pressure 109/70, pulse 85, temperature 98.3 ?F (36.8 ?C), height '5\' 1"'$  (1.549 m), weight 180 lb (81.6 kg), SpO2 97 %. ?Body mass index is 34.01 kg/m?Marland Kitchen ? ?  General: Cooperative, alert, well developed, in no acute distress. ?HEENT: Conjunctivae and lids unremarkable. ?Cardiovascular: Regular rhythm.  ?Lungs: Normal work of breathing. ?Neurologic: No focal deficits.  ? ?Lab Results  ?Component Value Date  ? CREATININE 0.90 10/10/2021  ? BUN 18 10/10/2021  ? NA 139 10/10/2021  ? K 4.6 10/10/2021  ? CL 104 10/10/2021  ? CO2 20 10/10/2021  ? ?Lab Results  ?Component  Value Date  ? ALT 18 10/10/2021  ? AST 18 10/10/2021  ? ALKPHOS 69 10/10/2021  ? BILITOT 0.2 10/10/2021  ? ?Lab Results  ?Component Value Date  ? HGBA1C 5.6 10/10/2021  ? HGBA1C 5.4 03/04/2021  ? HGBA1C 5.7 (H) 11/06/2020  ? ?Lab Results  ?Component Value Date  ? INSULIN 13.3 10/10/2021  ? INSULIN 14.2 03/04/2021  ? INSULIN 11.9 11/06/2020  ? ?Lab Results  ?Component Value Date  ? TSH 2.070 10/10/2021  ? ?Lab Results  ?Component Value Date  ? CHOL 246 (H) 10/10/2021  ? HDL 50 10/10/2021  ? LDLCALC 164 (H) 10/10/2021  ? TRIG 175 (H) 10/10/2021  ? ?Lab Results  ?Component Value Date  ? VD25OH 22.4 (L) 10/10/2021  ? VD25OH 29.5 (L) 03/04/2021  ? VD25OH 21.4 (L) 11/06/2020  ? ?Lab Results  ?Component Value Date  ? WBC 6.1 11/06/2020  ? HGB 13.0 11/06/2020  ? HCT 38.8 11/06/2020  ? MCV 87 11/06/2020  ? PLT 299 11/06/2020  ? ?No results found for: IRON, TIBC, FERRITIN ? ?Attestation Statements:  ? ?Reviewed by clinician on day of visit: allergies, medications, problem list, medical history, surgical history, family history, social history, and previous encounter notes. ? ?Time spent on visit including pre-visit chart review and post-visit care and charting was 40 minutes.  ? ? ?I, Trixie Dredge, am acting as transcriptionist for Dennard Nip, MD. ? ?I have reviewed the above documentation for accuracy and completeness, and I agree with the above. -  Dennard Nip, MD ? ? ?

## 2021-12-26 ENCOUNTER — Ambulatory Visit (INDEPENDENT_AMBULATORY_CARE_PROVIDER_SITE_OTHER): Payer: BC Managed Care – PPO | Admitting: Family Medicine

## 2022-01-04 ENCOUNTER — Other Ambulatory Visit (INDEPENDENT_AMBULATORY_CARE_PROVIDER_SITE_OTHER): Payer: Self-pay | Admitting: Family Medicine

## 2022-01-04 DIAGNOSIS — E782 Mixed hyperlipidemia: Secondary | ICD-10-CM

## 2022-01-17 ENCOUNTER — Other Ambulatory Visit (INDEPENDENT_AMBULATORY_CARE_PROVIDER_SITE_OTHER): Payer: Self-pay | Admitting: Family Medicine

## 2022-01-17 DIAGNOSIS — E782 Mixed hyperlipidemia: Secondary | ICD-10-CM

## 2022-01-28 ENCOUNTER — Encounter (INDEPENDENT_AMBULATORY_CARE_PROVIDER_SITE_OTHER): Payer: Self-pay | Admitting: Family Medicine

## 2022-01-28 ENCOUNTER — Ambulatory Visit (INDEPENDENT_AMBULATORY_CARE_PROVIDER_SITE_OTHER): Payer: BC Managed Care – PPO | Admitting: Family Medicine

## 2022-01-28 VITALS — BP 104/72 | HR 74 | Temp 98.2°F | Ht 61.0 in | Wt 177.2 lb

## 2022-01-28 DIAGNOSIS — E782 Mixed hyperlipidemia: Secondary | ICD-10-CM | POA: Diagnosis not present

## 2022-01-28 DIAGNOSIS — E559 Vitamin D deficiency, unspecified: Secondary | ICD-10-CM | POA: Diagnosis not present

## 2022-01-28 DIAGNOSIS — Z6833 Body mass index (BMI) 33.0-33.9, adult: Secondary | ICD-10-CM

## 2022-01-28 DIAGNOSIS — F3289 Other specified depressive episodes: Secondary | ICD-10-CM | POA: Diagnosis not present

## 2022-01-28 DIAGNOSIS — E038 Other specified hypothyroidism: Secondary | ICD-10-CM

## 2022-01-28 DIAGNOSIS — E669 Obesity, unspecified: Secondary | ICD-10-CM

## 2022-01-28 MED ORDER — VITAMIN D (ERGOCALCIFEROL) 1.25 MG (50000 UNIT) PO CAPS
ORAL_CAPSULE | ORAL | 0 refills | Status: DC
Start: 2022-01-28 — End: 2022-02-25

## 2022-01-28 MED ORDER — LEVOTHYROXINE SODIUM 75 MCG PO TABS: 75.0000 ug | ORAL_TABLET | Freq: Every day | ORAL | 0 refills | Status: DC

## 2022-01-28 MED ORDER — BUPROPION HCL ER (SR) 200 MG PO TB12: 200.0000 mg | ORAL_TABLET | Freq: Every day | ORAL | 0 refills | Status: DC

## 2022-01-28 MED ORDER — GEMFIBROZIL 600 MG PO TABS: 600.0000 mg | ORAL_TABLET | Freq: Two times a day (BID) | ORAL | 0 refills | Status: DC

## 2022-01-28 MED ORDER — ATORVASTATIN CALCIUM 10 MG PO TABS: 10.0000 mg | ORAL_TABLET | Freq: Every day | ORAL | 0 refills | Status: DC

## 2022-01-29 NOTE — Progress Notes (Signed)
Chief Complaint:   OBESITY Betty Porter is here to discuss her progress with her obesity treatment plan along with follow-up of her obesity related diagnoses. Betty Porter is on the Category 3 Plan and states she is following her eating plan approximately 30% of the time. Betty Porter states she is doing crossfit for 60 minutes 2 times per week.  Today's visit was #: 15 Starting weight: 178 lbs Starting date: 11/06/2020 Today's weight: 177 lbs Today's date: 01/28/2022 Total lbs lost to date: 1 Total lbs lost since last in-office visit: 3  Interim History: Betty Porter has struggled more in the last few months.  Her stress level is higher.  She has still done better with weight loss though.  Subjective:   1. Mixed hyperlipidemia Betty Porter is working on decreasing cholesterol in her diet.  No problems were noted.  Refill was requested today.  2. Other specified hypothyroidism Betty Porter is stable on her medications, and she is due for labs soon.  3. Vitamin D deficiency Betty Porter is on vitamin D prescription, and she is due for labs soon.  No side effects were noted.  4. Other depression with emotional eating Betty Porter is doing better dealing with emotional eating behaviors with increased dose of Wellbutrin.  Assessment/Plan:   1. Mixed hyperlipidemia Betty Porter will continue her medications, and we will refill Lipitor and Lopid for 1 month.  We will recheck labs in 1 month.  - atorvastatin (LIPITOR) 10 MG tablet; Take 1 tablet (10 mg total) by mouth at bedtime.  Dispense: 30 tablet; Refill: 0 - gemfibrozil (LOPID) 600 MG tablet; Take 1 tablet (600 mg total) by mouth in the morning and at bedtime.  Dispense: 60 tablet; Refill: 0  2. Other specified hypothyroidism Betty Porter will continue Synthroid, and we will refill for 1 month.  We will recheck labs in 1 month.  - levothyroxine (SYNTHROID) 75 MCG tablet; Take 1 tablet (75 mcg total) by mouth daily before breakfast.  Dispense: 30 tablet; Refill: 0  3.  Vitamin D deficiency We will refill prescription Vitamin D for 1 month, and we will recheck labs in 1 month. Betty Porter will follow-up for routine testing of Vitamin D, at least 2-3 times per year to avoid over-replacement.  - Vitamin D, Ergocalciferol, (DRISDOL) 1.25 MG (50000 UNIT) CAPS capsule; TAKE ONE CAPSULE BY MOUTH ONCE WEEKLY (EVERY 7 DAYS)  Dispense: 4 capsule; Refill: 0  4. Other depression with emotional eating Betty Porter will continue Wellbutrin SR 200 mg daily, and we will refill for 1 month.  - buPROPion (WELLBUTRIN SR) 200 MG 12 hr tablet; Take 1 tablet (200 mg total) by mouth daily.  Dispense: 30 tablet; Refill: 0  5. Obesity, Current BMI 33.5 Betty Porter is currently in the action stage of change. As such, her goal is to continue with weight loss efforts. She has agreed to the Category 3 Plan.   We will recheck fasting labs at her next visit.  Exercise goals: As is.   Behavioral modification strategies: increasing lean protein intake and meal planning and cooking strategies.  Betty Porter has agreed to follow-up with our clinic in 4 weeks. She was informed of the importance of frequent follow-up visits to maximize her success with intensive lifestyle modifications for her multiple health conditions.   Objective:   Blood pressure 104/72, pulse 74, temperature 98.2 F (36.8 C), temperature source Core (Comment), height '5\' 1"'$  (1.549 m), weight 177 lb 3.2 oz (80.4 kg), SpO2 98 %. Body mass index is 33.48 kg/m.  General: Cooperative, alert, well  developed, in no acute distress. HEENT: Conjunctivae and lids unremarkable. Cardiovascular: Regular rhythm.  Lungs: Normal work of breathing. Neurologic: No focal deficits.   Lab Results  Component Value Date   CREATININE 0.90 10/10/2021   BUN 18 10/10/2021   NA 139 10/10/2021   K 4.6 10/10/2021   CL 104 10/10/2021   CO2 20 10/10/2021   Lab Results  Component Value Date   ALT 18 10/10/2021   AST 18 10/10/2021   ALKPHOS 69  10/10/2021   BILITOT 0.2 10/10/2021   Lab Results  Component Value Date   HGBA1C 5.6 10/10/2021   HGBA1C 5.4 03/04/2021   HGBA1C 5.7 (H) 11/06/2020   Lab Results  Component Value Date   INSULIN 13.3 10/10/2021   INSULIN 14.2 03/04/2021   INSULIN 11.9 11/06/2020   Lab Results  Component Value Date   TSH 2.070 10/10/2021   Lab Results  Component Value Date   CHOL 246 (H) 10/10/2021   HDL 50 10/10/2021   LDLCALC 164 (H) 10/10/2021   TRIG 175 (H) 10/10/2021   Lab Results  Component Value Date   VD25OH 22.4 (L) 10/10/2021   VD25OH 29.5 (L) 03/04/2021   VD25OH 21.4 (L) 11/06/2020   Lab Results  Component Value Date   WBC 6.1 11/06/2020   HGB 13.0 11/06/2020   HCT 38.8 11/06/2020   MCV 87 11/06/2020   PLT 299 11/06/2020   No results found for: "IRON", "TIBC", "FERRITIN"  Attestation Statements:   Reviewed by clinician on day of visit: allergies, medications, problem list, medical history, surgical history, family history, social history, and previous encounter notes.   I, Trixie Dredge, am acting as transcriptionist for Dennard Nip, MD.  I have reviewed the above documentation for accuracy and completeness, and I agree with the above. -  Dennard Nip, MD

## 2022-02-07 ENCOUNTER — Other Ambulatory Visit (INDEPENDENT_AMBULATORY_CARE_PROVIDER_SITE_OTHER): Payer: Self-pay | Admitting: Family Medicine

## 2022-02-07 DIAGNOSIS — E559 Vitamin D deficiency, unspecified: Secondary | ICD-10-CM

## 2022-02-25 ENCOUNTER — Ambulatory Visit (INDEPENDENT_AMBULATORY_CARE_PROVIDER_SITE_OTHER): Payer: BC Managed Care – PPO | Admitting: Family Medicine

## 2022-02-25 ENCOUNTER — Encounter (INDEPENDENT_AMBULATORY_CARE_PROVIDER_SITE_OTHER): Payer: Self-pay | Admitting: Family Medicine

## 2022-02-25 VITALS — BP 102/67 | HR 62 | Temp 97.8°F | Ht 61.0 in | Wt 174.0 lb

## 2022-02-25 DIAGNOSIS — Z6833 Body mass index (BMI) 33.0-33.9, adult: Secondary | ICD-10-CM

## 2022-02-25 DIAGNOSIS — E7849 Other hyperlipidemia: Secondary | ICD-10-CM

## 2022-02-25 DIAGNOSIS — F3289 Other specified depressive episodes: Secondary | ICD-10-CM

## 2022-02-25 DIAGNOSIS — R7303 Prediabetes: Secondary | ICD-10-CM | POA: Insufficient documentation

## 2022-02-25 DIAGNOSIS — E039 Hypothyroidism, unspecified: Secondary | ICD-10-CM | POA: Diagnosis not present

## 2022-02-25 DIAGNOSIS — E559 Vitamin D deficiency, unspecified: Secondary | ICD-10-CM

## 2022-02-25 DIAGNOSIS — E782 Mixed hyperlipidemia: Secondary | ICD-10-CM | POA: Insufficient documentation

## 2022-02-25 DIAGNOSIS — E669 Obesity, unspecified: Secondary | ICD-10-CM

## 2022-02-25 MED ORDER — ATORVASTATIN CALCIUM 10 MG PO TABS: 10.0000 mg | ORAL_TABLET | Freq: Every day | ORAL | 0 refills | Status: DC

## 2022-02-25 MED ORDER — GEMFIBROZIL 600 MG PO TABS: 600.0000 mg | ORAL_TABLET | Freq: Two times a day (BID) | ORAL | 0 refills | Status: DC

## 2022-02-25 MED ORDER — BUPROPION HCL ER (SR) 200 MG PO TB12: 200.0000 mg | ORAL_TABLET | Freq: Every day | ORAL | 0 refills | Status: DC

## 2022-02-25 MED ORDER — LEVOTHYROXINE SODIUM 75 MCG PO TABS: 75.0000 ug | ORAL_TABLET | Freq: Every day | ORAL | 0 refills | Status: DC

## 2022-02-25 MED ORDER — VITAMIN D (ERGOCALCIFEROL) 1.25 MG (50000 UNIT) PO CAPS: ORAL_CAPSULE | ORAL | 0 refills | Status: DC

## 2022-02-26 LAB — LIPID PANEL WITH LDL/HDL RATIO
Cholesterol, Total: 195 mg/dL (ref 100–199)
HDL: 47 mg/dL (ref >39)
LDL Chol Calc (NIH): 110 mg/dL — ABNORMAL HIGH (ref 0–99)
LDL/HDL Ratio: 2.3 ratio (ref 0.0–3.2)
Triglycerides: 221 mg/dL — ABNORMAL HIGH (ref 0–149)
VLDL Cholesterol Cal: 38 mg/dL (ref 5–40)

## 2022-02-26 LAB — HEMOGLOBIN A1C
Est. average glucose Bld gHb Est-mCnc: 114 mg/dL
Hgb A1c MFr Bld: 5.6 % (ref 4.8–5.6)

## 2022-02-26 LAB — CMP14+EGFR
ALT: 25 IU/L (ref 0–32)
AST: 27 IU/L (ref 0–40)
Albumin/Globulin Ratio: 1.8 (ref 1.2–2.2)
Albumin: 4.7 g/dL (ref 3.9–4.9)
Alkaline Phosphatase: 73 IU/L (ref 44–121)
BUN/Creatinine Ratio: 12 (ref 9–23)
BUN: 13 mg/dL (ref 6–24)
Bilirubin Total: 0.2 mg/dL (ref 0.0–1.2)
CO2: 21 mmol/L (ref 20–29)
Calcium: 9.4 mg/dL (ref 8.7–10.2)
Chloride: 102 mmol/L (ref 96–106)
Creatinine, Ser: 1.06 mg/dL — ABNORMAL HIGH (ref 0.57–1.00)
Globulin, Total: 2.6 g/dL (ref 1.5–4.5)
Glucose: 89 mg/dL (ref 70–99)
Potassium: 4.7 mmol/L (ref 3.5–5.2)
Sodium: 137 mmol/L (ref 134–144)
Total Protein: 7.3 g/dL (ref 6.0–8.5)
eGFR: 64 mL/min/{1.73_m2} (ref >59)

## 2022-02-26 LAB — INSULIN, RANDOM: INSULIN: 16.4 u[IU]/mL (ref 2.6–24.9)

## 2022-02-26 LAB — TSH: TSH: 1.77 u[IU]/mL (ref 0.450–4.500)

## 2022-02-26 LAB — VITAMIN D 25 HYDROXY (VIT D DEFICIENCY, FRACTURES): Vit D, 25-Hydroxy: 36 ng/mL (ref 30.0–100.0)

## 2022-02-26 NOTE — Progress Notes (Signed)
Chief Complaint:   OBESITY Betty Porter is here to discuss her progress with her obesity treatment plan along with follow-up of her obesity related diagnoses. Betty Porter is on the Category 3 Plan and states she is following her eating plan approximately 30% of the time. Betty Porter states she is doing Crossfit 60 minutes 4 times per week.  Today's visit was #: 25 Starting weight: 178 lbs Starting date: 11/06/2020 Today's weight: 174 lbs Today's date: 02/25/2022 Total lbs lost to date: 4 Total lbs lost since last in-office visit: 3  Interim History: Betty Porter has done well with weight loss despite extra stress with moving. She continues to exercise regularly and is working on meal planning and prepping.   Subjective:   1. Hypothyroidism, unspecified type Betty Porter is on Synthroid, and she is due for labs. She has no signs of hyper or hypothyroid.   2. Other hyperlipidemia Betty Porter is working on decreasing cholesterol in her diet. She has no problems with her medications. She is due for labs.   3. Vitamin D deficiency Betty Porter is stable on Vitamin D, and she denies nausea, vomiting, or muscle weakness.   4. Pre-diabetes Betty Porter is working on her diet, exercise, and weight loss. She is due for labs.  5. Other depression with emotional eating Betty Porter has had increased stress and she has done some stress eating, but she has done well with minimizing this.   Assessment/Plan:   1. Hypothyroidism, unspecified type We will check labs today. Betty Porter will continue Synthroid 75 mcg q AM, and we will refill for 1 month.  - TSH - levothyroxine (SYNTHROID) 75 MCG tablet; Take 1 tablet (75 mcg total) by mouth daily before breakfast.  Dispense: 30 tablet; Refill: 0  2. Other hyperlipidemia We will check labs today. Betty Porter will continue her medications, and we will refill Lipitor and Lopid for 1 month.  - Lipid Panel With LDL/HDL Ratio - atorvastatin (LIPITOR) 10 MG tablet; Take 1 tablet (10 mg total)  by mouth at bedtime.  Dispense: 30 tablet; Refill: 0 - gemfibrozil (LOPID) 600 MG tablet; Take 1 tablet (600 mg total) by mouth in the morning and at bedtime.  Dispense: 60 tablet; Refill: 0  3. Vitamin D deficiency We will check labs today, and we will refill prescription Vitamin D 50,000 IU once weekly for 1 month.  - VITAMIN D 25 Hydroxy (Vit-D Deficiency, Fractures) - Vitamin D, Ergocalciferol, (DRISDOL) 1.25 MG (50000 UNIT) CAPS capsule; TAKE ONE CAPSULE BY MOUTH ONCE WEEKLY (EVERY 7 DAYS)  Dispense: 4 capsule; Refill: 0  4. Pre-diabetes We will check labs today. Betty Porter will continue to work on weight loss, exercise, and decreasing simple carbohydrates to help decrease the risk of diabetes.   - CMP14+EGFR - Insulin, random - Hemoglobin A1c  5. Other depression with emotional eating We will refill Wellbutrin SR 200 mg once daily for 1 month. Behavior modification techniques were discussed today to help Betty Porter deal with her emotional/non-hunger eating behaviors.  Orders and follow up as documented in patient record.   - buPROPion (WELLBUTRIN SR) 200 MG 12 hr tablet; Take 1 tablet (200 mg total) by mouth daily.  Dispense: 30 tablet; Refill: 0  6. Obesity, Current BMI 33.0 Betty Porter is currently in the action stage of change. As such, her goal is to continue with weight loss efforts. She has agreed to the Category 3 Plan.   Exercise goals: As is.   Behavioral modification strategies: increasing water intake and travel eating strategies.  Betty Porter has agreed  to follow-up with our clinic in 4 weeks. She was informed of the importance of frequent follow-up visits to maximize her success with intensive lifestyle modifications for her multiple health conditions.   Betty Porter was informed we would discuss her lab results at her next visit unless there is a critical issue that needs to be addressed sooner. Betty Porter agreed to keep her next visit at the agreed upon time to discuss these  results.  Objective:   Blood pressure 102/67, pulse 62, temperature 97.8 F (36.6 C), height _0  (1.549 m), weight 174 lb (78.9 kg), SpO2 97 %. Body mass index is 32.88 kg/m.  General: Cooperative, alert, well developed, in no acute distress. HEENT: Conjunctivae and lids unremarkable. Cardiovascular: Regular rhythm.  Lungs: Normal work of breathing. Neurologic: No focal deficits.   Lab Results  Component Value Date   CREATININE 1.06 (H) 02/25/2022   BUN 13 02/25/2022   NA 137 02/25/2022   K 4.7 02/25/2022   CL 102 02/25/2022   CO2 21 02/25/2022   Lab Results  Component Value Date   ALT 25 02/25/2022   AST 27 02/25/2022   ALKPHOS 73 02/25/2022   BILITOT 0.2 02/25/2022   Lab Results  Component Value Date   HGBA1C 5.6 02/25/2022   HGBA1C 5.6 10/10/2021   HGBA1C 5.4 03/04/2021   HGBA1C 5.7 (H) 11/06/2020   Lab Results  Component Value Date   INSULIN 16.4 02/25/2022   INSULIN 13.3 10/10/2021   INSULIN 14.2 03/04/2021   INSULIN 11.9 11/06/2020   Lab Results  Component Value Date   TSH 1.770 02/25/2022   Lab Results  Component Value Date   CHOL 195 02/25/2022   HDL 47 02/25/2022   LDLCALC 110 (H) 02/25/2022   TRIG 221 (H) 02/25/2022   Lab Results  Component Value Date   VD25OH 36.0 02/25/2022   VD25OH 22.4 (L) 10/10/2021   VD25OH 29.5 (L) 03/04/2021   Lab Results  Component Value Date   WBC 6.1 11/06/2020   HGB 13.0 11/06/2020   HCT 38.8 11/06/2020   MCV 87 11/06/2020   PLT 299 11/06/2020   No results found for: "IRON", "TIBC", "FERRITIN"  Attestation Statements:   Reviewed by clinician on day of visit: allergies, medications, problem list, medical history, surgical history, family history, social history, and previous encounter notes.   I, Trixie Dredge, am acting as transcriptionist for Dennard Nip, MD.  I have reviewed the above documentation for accuracy and completeness, and I agree with the above. -  Dennard Nip, MD

## 2022-02-27 ENCOUNTER — Other Ambulatory Visit (INDEPENDENT_AMBULATORY_CARE_PROVIDER_SITE_OTHER): Payer: Self-pay | Admitting: Family Medicine

## 2022-02-27 DIAGNOSIS — E559 Vitamin D deficiency, unspecified: Secondary | ICD-10-CM

## 2022-03-01 ENCOUNTER — Other Ambulatory Visit (INDEPENDENT_AMBULATORY_CARE_PROVIDER_SITE_OTHER): Payer: Self-pay | Admitting: Family Medicine

## 2022-03-01 DIAGNOSIS — E7849 Other hyperlipidemia: Secondary | ICD-10-CM

## 2022-03-19 ENCOUNTER — Encounter (INDEPENDENT_AMBULATORY_CARE_PROVIDER_SITE_OTHER): Payer: Self-pay

## 2022-03-25 ENCOUNTER — Ambulatory Visit (INDEPENDENT_AMBULATORY_CARE_PROVIDER_SITE_OTHER): Payer: BC Managed Care – PPO | Admitting: Family Medicine

## 2022-03-25 ENCOUNTER — Encounter (INDEPENDENT_AMBULATORY_CARE_PROVIDER_SITE_OTHER): Payer: Self-pay | Admitting: Family Medicine

## 2022-03-25 VITALS — BP 105/65 | HR 71 | Temp 98.2°F | Ht 61.0 in | Wt 176.0 lb

## 2022-03-25 DIAGNOSIS — E559 Vitamin D deficiency, unspecified: Secondary | ICD-10-CM

## 2022-03-25 DIAGNOSIS — F3289 Other specified depressive episodes: Secondary | ICD-10-CM | POA: Diagnosis not present

## 2022-03-25 DIAGNOSIS — F32A Depression, unspecified: Secondary | ICD-10-CM | POA: Insufficient documentation

## 2022-03-25 DIAGNOSIS — Z6833 Body mass index (BMI) 33.0-33.9, adult: Secondary | ICD-10-CM

## 2022-03-25 DIAGNOSIS — E7849 Other hyperlipidemia: Secondary | ICD-10-CM

## 2022-03-25 DIAGNOSIS — E669 Obesity, unspecified: Secondary | ICD-10-CM

## 2022-03-25 DIAGNOSIS — E039 Hypothyroidism, unspecified: Secondary | ICD-10-CM | POA: Diagnosis not present

## 2022-03-25 MED ORDER — ATORVASTATIN CALCIUM 10 MG PO TABS: 10.0000 mg | ORAL_TABLET | Freq: Every day | ORAL | 0 refills | Status: DC

## 2022-03-25 MED ORDER — BUPROPION HCL ER (SR) 200 MG PO TB12: 200.0000 mg | ORAL_TABLET | Freq: Every day | ORAL | 0 refills | Status: DC

## 2022-03-25 MED ORDER — LEVOTHYROXINE SODIUM 75 MCG PO TABS: 75.0000 ug | ORAL_TABLET | Freq: Every day | ORAL | 0 refills | Status: DC

## 2022-03-25 MED ORDER — GEMFIBROZIL 600 MG PO TABS: 600.0000 mg | ORAL_TABLET | Freq: Two times a day (BID) | ORAL | 0 refills | Status: DC

## 2022-03-25 MED ORDER — VITAMIN D (ERGOCALCIFEROL) 1.25 MG (50000 UNIT) PO CAPS: ORAL_CAPSULE | ORAL | 0 refills | Status: DC

## 2022-03-27 ENCOUNTER — Other Ambulatory Visit (INDEPENDENT_AMBULATORY_CARE_PROVIDER_SITE_OTHER): Payer: Self-pay | Admitting: Family Medicine

## 2022-03-27 DIAGNOSIS — E559 Vitamin D deficiency, unspecified: Secondary | ICD-10-CM

## 2022-03-29 ENCOUNTER — Other Ambulatory Visit (INDEPENDENT_AMBULATORY_CARE_PROVIDER_SITE_OTHER): Payer: Self-pay | Admitting: Family Medicine

## 2022-03-29 DIAGNOSIS — E7849 Other hyperlipidemia: Secondary | ICD-10-CM

## 2022-04-02 NOTE — Progress Notes (Signed)
Chief Complaint:   OBESITY Betty Porter is here to discuss her progress with her obesity treatment plan along with follow-up of her obesity related diagnoses. Betty Porter is on the Category 3 Plan and states she is following her eating plan approximately 20% of the time. Betty Porter states she is doing Crossfit for 60 minutes 3 times per week.  Today's visit was #: 17 Starting weight: 178 lbs Starting date: 11/06/2020 Today's weight: 176 lbs Today's date: 03/25/2022 Total lbs lost to date: 2 Total lbs lost since last in-office visit: 0  Interim History: Betty Porter is in the process of moving and has no kitchen right now. She has been eating out more and although she is mindful, she is gaining weight.   Subjective:   1. Hypothyroidism, unspecified type Betty Porter recently moved and she cannot find her levothyroxine. I discussed labs with the patient today.   2. Other hyperlipidemia Betty Porter's last LDL improved on Lipitor. I discussed labs with the patient today.   3. Vitamin D deficiency Betty Porter is on Vitamin D prescription, and her level is slowly improving but not yet at goal. I discussed labs with the patient today.   4. Other depression with emotional eating Betty Porter is stable on Wellbutrin, and she is working on decreasing emotional eating behaviors.   Assessment/Plan:   1. Hypothyroidism, unspecified type Betty Porter will continue levothyroxine 75 mcg, and we will refill for 1 month.  - levothyroxine (SYNTHROID) 75 MCG tablet; Take 1 tablet (75 mcg total) by mouth daily before breakfast.  Dispense: 30 tablet; Refill: 0  2. Other hyperlipidemia Betty Porter will continue her medications, and we will refill Lipitor and Lopid for 1 month.  - atorvastatin (LIPITOR) 10 MG tablet; Take 1 tablet (10 mg total) by mouth at bedtime.  Dispense: 30 tablet; Refill: 0 - gemfibrozil (LOPID) 600 MG tablet; Take 1 tablet (600 mg total) by mouth in the morning and at bedtime.  Dispense: 60 tablet; Refill: 0  3.  Vitamin D deficiency Betty Porter will continue prescription Vitamin D 50,000 IU every week, and we will refill for 1 month. She will follow-up for routine testing of Vitamin D, at least 2-3 times per year to avoid over-replacement.  - Vitamin D, Ergocalciferol, (DRISDOL) 1.25 MG (50000 UNIT) CAPS capsule; TAKE ONE CAPSULE BY MOUTH ONCE WEEKLY (EVERY 7 DAYS)  Dispense: 4 capsule; Refill: 0  4. Other depression with emotional eating We will refill Wellbutrin SR for 1 month. Behavior modification techniques were discussed today to help Betty Porter deal with her emotional/non-hunger eating behaviors.  Orders and follow up as documented in patient record.   - buPROPion (WELLBUTRIN SR) 200 MG 12 hr tablet; Take 1 tablet (200 mg total) by mouth daily.  Dispense: 30 tablet; Refill: 0  5. Obesity, Current BMI 33.4 Betty Porter is currently in the action stage of change. As such, her goal is to continue with weight loss efforts. She has agreed to the Category 3 Plan.   Eating Out handout was given to help her more better choices until she has a kitchen.  Exercise goals: As is.   Behavioral modification strategies: increasing lean protein intake.  Betty Porter has agreed to follow-up with our clinic in 4 weeks. She was informed of the importance of frequent follow-up visits to maximize her success with intensive lifestyle modifications for her multiple health conditions.   Objective:   Blood pressure 105/65, pulse 71, temperature 98.2 F (36.8 C), height '5\' 1"'$  (1.549 m), weight 176 lb (79.8 kg), SpO2 99 %. Body  mass index is 33.25 kg/m.  General: Cooperative, alert, well developed, in no acute distress. HEENT: Conjunctivae and lids unremarkable. Cardiovascular: Regular rhythm.  Lungs: Normal work of breathing. Neurologic: No focal deficits.   Lab Results  Component Value Date   CREATININE 1.06 (H) 02/25/2022   BUN 13 02/25/2022   NA 137 02/25/2022   K 4.7 02/25/2022   CL 102 02/25/2022   CO2 21  02/25/2022   Lab Results  Component Value Date   ALT 25 02/25/2022   AST 27 02/25/2022   ALKPHOS 73 02/25/2022   BILITOT 0.2 02/25/2022   Lab Results  Component Value Date   HGBA1C 5.6 02/25/2022   HGBA1C 5.6 10/10/2021   HGBA1C 5.4 03/04/2021   HGBA1C 5.7 (H) 11/06/2020   Lab Results  Component Value Date   INSULIN 16.4 02/25/2022   INSULIN 13.3 10/10/2021   INSULIN 14.2 03/04/2021   INSULIN 11.9 11/06/2020   Lab Results  Component Value Date   TSH 1.770 02/25/2022   Lab Results  Component Value Date   CHOL 195 02/25/2022   HDL 47 02/25/2022   LDLCALC 110 (H) 02/25/2022   TRIG 221 (H) 02/25/2022   Lab Results  Component Value Date   VD25OH 36.0 02/25/2022   VD25OH 22.4 (L) 10/10/2021   VD25OH 29.5 (L) 03/04/2021   Lab Results  Component Value Date   WBC 6.1 11/06/2020   HGB 13.0 11/06/2020   HCT 38.8 11/06/2020   MCV 87 11/06/2020   PLT 299 11/06/2020   No results found for: "IRON", "TIBC", "FERRITIN"  Attestation Statements:   Reviewed by clinician on day of visit: allergies, medications, problem list, medical history, surgical history, family history, social history, and previous encounter notes.   I, Trixie Dredge, am acting as transcriptionist for Dennard Nip, MD.  I have reviewed the above documentation for accuracy and completeness, and I agree with the above. -  Dennard Nip, MD

## 2022-04-20 ENCOUNTER — Other Ambulatory Visit (INDEPENDENT_AMBULATORY_CARE_PROVIDER_SITE_OTHER): Payer: Self-pay | Admitting: Family Medicine

## 2022-04-20 DIAGNOSIS — E559 Vitamin D deficiency, unspecified: Secondary | ICD-10-CM

## 2022-04-22 ENCOUNTER — Other Ambulatory Visit (INDEPENDENT_AMBULATORY_CARE_PROVIDER_SITE_OTHER): Payer: Self-pay | Admitting: Family Medicine

## 2022-04-22 ENCOUNTER — Ambulatory Visit (INDEPENDENT_AMBULATORY_CARE_PROVIDER_SITE_OTHER): Payer: BC Managed Care – PPO | Admitting: Family Medicine

## 2022-04-22 ENCOUNTER — Encounter (INDEPENDENT_AMBULATORY_CARE_PROVIDER_SITE_OTHER): Payer: Self-pay | Admitting: Family Medicine

## 2022-04-22 VITALS — BP 117/71 | HR 62 | Temp 97.8°F | Ht 61.0 in | Wt 172.0 lb

## 2022-04-22 DIAGNOSIS — E7849 Other hyperlipidemia: Secondary | ICD-10-CM

## 2022-04-22 DIAGNOSIS — E669 Obesity, unspecified: Secondary | ICD-10-CM | POA: Insufficient documentation

## 2022-04-22 DIAGNOSIS — Z6832 Body mass index (BMI) 32.0-32.9, adult: Secondary | ICD-10-CM

## 2022-04-22 DIAGNOSIS — E039 Hypothyroidism, unspecified: Secondary | ICD-10-CM | POA: Diagnosis not present

## 2022-04-22 DIAGNOSIS — E782 Mixed hyperlipidemia: Secondary | ICD-10-CM

## 2022-04-22 DIAGNOSIS — E559 Vitamin D deficiency, unspecified: Secondary | ICD-10-CM

## 2022-04-22 DIAGNOSIS — F3289 Other specified depressive episodes: Secondary | ICD-10-CM | POA: Diagnosis not present

## 2022-04-22 MED ORDER — LEVOTHYROXINE SODIUM 75 MCG PO TABS: 75.0000 ug | ORAL_TABLET | Freq: Every day | ORAL | 0 refills | Status: DC

## 2022-04-22 MED ORDER — GEMFIBROZIL 600 MG PO TABS: 600.0000 mg | ORAL_TABLET | Freq: Two times a day (BID) | ORAL | 0 refills | Status: DC

## 2022-04-22 MED ORDER — ATORVASTATIN CALCIUM 10 MG PO TABS: 10.0000 mg | ORAL_TABLET | Freq: Every day | ORAL | 0 refills | Status: DC

## 2022-04-22 MED ORDER — BUPROPION HCL ER (SR) 200 MG PO TB12: 200.0000 mg | ORAL_TABLET | Freq: Every day | ORAL | 0 refills | Status: DC

## 2022-04-22 MED ORDER — VITAMIN D (ERGOCALCIFEROL) 1.25 MG (50000 UNIT) PO CAPS: ORAL_CAPSULE | ORAL | 0 refills | Status: DC

## 2022-04-28 NOTE — Progress Notes (Unsigned)
Chief Complaint:   OBESITY Betty Porter is here to discuss her progress with her obesity treatment plan along with follow-up of her obesity related diagnoses. Betty Porter is on the Category 3 Plan and states she is following her eating plan approximately 75% of the time. Betty Porter states she is doing Crossfit for 60 minutes 3 times per week.  Today's visit was #: 18 Starting weight: 178 lbs Starting date: 11/06/2020 Today's weight: 172 lbs Today's date: 04/22/2022 Total lbs lost to date: 6 Total lbs lost since last in-office visit: 4  Interim History: Betty Porter's chin is finally finished and she is back to meal planning and back to work as well.  She notes her hunger is controlled.  Subjective:   1. Mixed hyperlipidemia Betty Porter is statin and Lopid, and she is working on her diet.  No side effects were noted.  2. Hypothyroidism, unspecified type Betty Porter is stable on levothyroxine, and her last levels were at goal. She denies tremors or palpitations.   3. Vitamin D deficiency Betty Porter is stable on Vitamin D, with no side effects noted.   4. Other depression with emotional eating Betty Porter is doing better with decreasing emotional eating behaviors.  Her blood pressure is stable and she denies insomnia.  Assessment/Plan:   1. Mixed hyperlipidemia Betty Porter will continue her medications, and we will refill Lipitor and gemfibrozil for 1 month.  - atorvastatin (LIPITOR) 10 MG tablet; Take 1 tablet (10 mg total) by mouth at bedtime.  Dispense: 30 tablet; Refill: 0 - gemfibrozil (LOPID) 600 MG tablet; Take 1 tablet (600 mg total) by mouth in the morning and at bedtime.  Dispense: 60 tablet; Refill: 0  2. Hypothyroidism, unspecified type Betty Porter will continue levothyroxine 75 mcg daily, and we will refill for 1 month.  - levothyroxine (SYNTHROID) 75 MCG tablet; Take 1 tablet (75 mcg total) by mouth daily before breakfast.  Dispense: 30 tablet; Refill: 0  3. Vitamin D deficiency Betty Porter will  continue prescription Vitamin D 50,000 IU weekly, and we will refill for 1 month.  - Vitamin D, Ergocalciferol, (DRISDOL) 1.25 MG (50000 UNIT) CAPS capsule; TAKE ONE CAPSULE BY MOUTH ONCE WEEKLY (EVERY 7 DAYS)  Dispense: 4 capsule; Refill: 0  4. Other depression with emotional eating Betty Porter will continue Wellbutrin SR 200 mg daily, and we will refill for 1 month.  - buPROPion (WELLBUTRIN SR) 200 MG 12 hr tablet; Take 1 tablet (200 mg total) by mouth daily.  Dispense: 30 tablet; Refill: 0  5. Obesity, Current BMI 32.6 Betty Porter is currently in the action stage of change. As such, her goal is to continue with weight loss efforts. She has agreed to the Category 3 Plan.   Exercise goals: As is.   Behavioral modification strategies: increasing lean protein intake.  Betty Porter has agreed to follow-up with our clinic in 4 weeks. She was informed of the importance of frequent follow-up visits to maximize her success with intensive lifestyle modifications for her multiple health conditions.   Objective:   Blood pressure 117/71, pulse 62, temperature 97.8 F (36.6 C), height '5\' 1"'$  (1.549 m), weight 172 lb (78 kg), SpO2 98 %. Body mass index is 32.5 kg/m.  General: Cooperative, alert, well developed, in no acute distress. HEENT: Conjunctivae and lids unremarkable. Cardiovascular: Regular rhythm.  Lungs: Normal work of breathing. Neurologic: No focal deficits.   Lab Results  Component Value Date   CREATININE 1.06 (H) 02/25/2022   BUN 13 02/25/2022   NA 137 02/25/2022   K 4.7 02/25/2022  CL 102 02/25/2022   CO2 21 02/25/2022   Lab Results  Component Value Date   ALT 25 02/25/2022   AST 27 02/25/2022   ALKPHOS 73 02/25/2022   BILITOT 0.2 02/25/2022   Lab Results  Component Value Date   HGBA1C 5.6 02/25/2022   HGBA1C 5.6 10/10/2021   HGBA1C 5.4 03/04/2021   HGBA1C 5.7 (H) 11/06/2020   Lab Results  Component Value Date   INSULIN 16.4 02/25/2022   INSULIN 13.3 10/10/2021    INSULIN 14.2 03/04/2021   INSULIN 11.9 11/06/2020   Lab Results  Component Value Date   TSH 1.770 02/25/2022   Lab Results  Component Value Date   CHOL 195 02/25/2022   HDL 47 02/25/2022   LDLCALC 110 (H) 02/25/2022   TRIG 221 (H) 02/25/2022   Lab Results  Component Value Date   VD25OH 36.0 02/25/2022   VD25OH 22.4 (L) 10/10/2021   VD25OH 29.5 (L) 03/04/2021   Lab Results  Component Value Date   WBC 6.1 11/06/2020   HGB 13.0 11/06/2020   HCT 38.8 11/06/2020   MCV 87 11/06/2020   PLT 299 11/06/2020   No results found for: "IRON", "TIBC", "FERRITIN"  Attestation Statements:   Reviewed by clinician on day of visit: allergies, medications, problem list, medical history, surgical history, family history, social history, and previous encounter notes.   I, Trixie Dredge, am acting as transcriptionist for Dennard Nip, MD.  I have reviewed the above documentation for accuracy and completeness, and I agree with the above. -  Dennard Nip, MD

## 2022-05-18 ENCOUNTER — Other Ambulatory Visit (INDEPENDENT_AMBULATORY_CARE_PROVIDER_SITE_OTHER): Payer: Self-pay | Admitting: Family Medicine

## 2022-05-18 DIAGNOSIS — E559 Vitamin D deficiency, unspecified: Secondary | ICD-10-CM

## 2022-05-20 ENCOUNTER — Ambulatory Visit (INDEPENDENT_AMBULATORY_CARE_PROVIDER_SITE_OTHER): Payer: BC Managed Care – PPO | Admitting: Family Medicine

## 2022-05-20 ENCOUNTER — Other Ambulatory Visit (INDEPENDENT_AMBULATORY_CARE_PROVIDER_SITE_OTHER): Payer: Self-pay | Admitting: Family Medicine

## 2022-05-20 ENCOUNTER — Encounter (INDEPENDENT_AMBULATORY_CARE_PROVIDER_SITE_OTHER): Payer: Self-pay | Admitting: Family Medicine

## 2022-05-20 DIAGNOSIS — E782 Mixed hyperlipidemia: Secondary | ICD-10-CM

## 2022-05-20 DIAGNOSIS — E039 Hypothyroidism, unspecified: Secondary | ICD-10-CM

## 2022-05-20 DIAGNOSIS — E669 Obesity, unspecified: Secondary | ICD-10-CM

## 2022-05-20 DIAGNOSIS — Z6832 Body mass index (BMI) 32.0-32.9, adult: Secondary | ICD-10-CM

## 2022-05-20 DIAGNOSIS — E559 Vitamin D deficiency, unspecified: Secondary | ICD-10-CM | POA: Diagnosis not present

## 2022-05-20 DIAGNOSIS — F3289 Other specified depressive episodes: Secondary | ICD-10-CM

## 2022-05-20 MED ORDER — ATORVASTATIN CALCIUM 10 MG PO TABS: 10.0000 mg | ORAL_TABLET | Freq: Every day | ORAL | 0 refills | Status: DC

## 2022-05-20 MED ORDER — BUPROPION HCL ER (SR) 200 MG PO TB12: 200.0000 mg | ORAL_TABLET | Freq: Every day | ORAL | 0 refills | Status: DC

## 2022-05-20 MED ORDER — VITAMIN D (ERGOCALCIFEROL) 1.25 MG (50000 UNIT) PO CAPS: ORAL_CAPSULE | ORAL | 0 refills | Status: DC

## 2022-05-20 MED ORDER — GEMFIBROZIL 600 MG PO TABS: 600.0000 mg | ORAL_TABLET | Freq: Two times a day (BID) | ORAL | 0 refills | Status: DC

## 2022-05-20 MED ORDER — LEVOTHYROXINE SODIUM 75 MCG PO TABS: 75.0000 ug | ORAL_TABLET | Freq: Every day | ORAL | 0 refills | Status: DC

## 2022-05-27 NOTE — Progress Notes (Signed)
Chief Complaint:   OBESITY Betty Porter is here to discuss her progress with her obesity treatment plan along with follow-up of her obesity related diagnoses. Carnella is on the Category 3 Plan and states she is following her eating plan approximately 50% of the time. Teasha states she is doing Crossfit for 60 minutes 2 times per week.  Today's visit was #: 52 Starting weight: 178 lbs Starting date: 11/06/2020 Today's weight: 171 lbs Today's date: 05/20/2022 Total lbs lost to date: 7 Total lbs lost since last in-office visit: 1  Interim History: Betty Porter is doing well with weight loss. She still notes some cravings/celebration eating. She was encouraged to try to get in all of the food on the plan and increase protein to help with cravings.   Subjective:   1. Vitamin D deficiency Betty Porter is taking prescription vitamin D once weekly with no side effects noted.  2. Mixed hyperlipidemia Betty Porter is taking Lipitor and gemfibrozil with no side effects noted.  She is working on her weight loss, diet, and exercise.  3. Hypothyroidism, unspecified type Betty Porter is taking levothyroxine with no side effects noted.  4. Other depression with emotional eating Betty Porter is taking Wellbutrin, but she does not feel her appetite is changed much.  She had missed some doses.  She denies side effects.  Assessment/Plan:   1. Vitamin D deficiency We will refill prescription Vitamin D for 1 month.  Alyse will follow-up for routine testing of Vitamin D, at least 2-3 times per year to avoid over-replacement.  - Vitamin D, Ergocalciferol, (DRISDOL) 1.25 MG (50000 UNIT) CAPS capsule; TAKE ONE CAPSULE BY MOUTH ONCE WEEKLY (EVERY 7 DAYS)  Dispense: 4 capsule; Refill: 0  2. Mixed hyperlipidemia Ilia will continue her medications, diet, and exercise.  We will refill both Lipitor and gemfibrozil for 1 month.  - gemfibrozil (LOPID) 600 MG tablet; Take 1 tablet (600 mg total) by mouth in the morning and at  bedtime.  Dispense: 60 tablet; Refill: 0 - atorvastatin (LIPITOR) 10 MG tablet; Take 1 tablet (10 mg total) by mouth at bedtime.  Dispense: 30 tablet; Refill: 0  3. Hypothyroidism, unspecified type Alajia will continue levothyroxine 75 mcg daily, and we will refill for 1 month.  - levothyroxine (SYNTHROID) 75 MCG tablet; Take 1 tablet (75 mcg total) by mouth daily before breakfast.  Dispense: 30 tablet; Refill: 0  4. Other depression with emotional eating Betty Porter will continue Wellbutrin SR 200 mg daily, and we will refill for 1 month.  We discussed the importance of consistently taking Wellbutrin daily at the same time to get the full effect of the medication.  - buPROPion (WELLBUTRIN SR) 200 MG 12 hr tablet; Take 1 tablet (200 mg total) by mouth daily.  Dispense: 30 tablet; Refill: 0  5. Obesity, Current BMI 32.4 Betty Porter is currently in the action stage of change. As such, her goal is to continue with weight loss efforts. She has agreed to the Category 3 Plan.   We will discussed strategies for evening snacking.   Exercise goals: As is.   Behavioral modification strategies: increasing lean protein intake, decreasing simple carbohydrates, meal planning and cooking strategies, better snacking choices, emotional eating strategies, and celebration eating strategies.  Betty Porter has agreed to follow-up with our clinic in 4 weeks. She was informed of the importance of frequent follow-up visits to maximize her success with intensive lifestyle modifications for her multiple health conditions.   Objective:   Blood pressure 108/72, pulse 75, temperature 97.8 F (  36.6 C), height '5\' 1"'$  (1.549 m), weight 171 lb (77.6 kg), SpO2 99 %. Body mass index is 32.31 kg/m.  General: Cooperative, alert, well developed, in no acute distress. HEENT: Conjunctivae and lids unremarkable. Cardiovascular: Regular rhythm.  Lungs: Normal work of breathing. Neurologic: No focal deficits.   Lab Results  Component  Value Date   CREATININE 1.06 (H) 02/25/2022   BUN 13 02/25/2022   NA 137 02/25/2022   K 4.7 02/25/2022   CL 102 02/25/2022   CO2 21 02/25/2022   Lab Results  Component Value Date   ALT 25 02/25/2022   AST 27 02/25/2022   ALKPHOS 73 02/25/2022   BILITOT 0.2 02/25/2022   Lab Results  Component Value Date   HGBA1C 5.6 02/25/2022   HGBA1C 5.6 10/10/2021   HGBA1C 5.4 03/04/2021   HGBA1C 5.7 (H) 11/06/2020   Lab Results  Component Value Date   INSULIN 16.4 02/25/2022   INSULIN 13.3 10/10/2021   INSULIN 14.2 03/04/2021   INSULIN 11.9 11/06/2020   Lab Results  Component Value Date   TSH 1.770 02/25/2022   Lab Results  Component Value Date   CHOL 195 02/25/2022   HDL 47 02/25/2022   LDLCALC 110 (H) 02/25/2022   TRIG 221 (H) 02/25/2022   Lab Results  Component Value Date   VD25OH 36.0 02/25/2022   VD25OH 22.4 (L) 10/10/2021   VD25OH 29.5 (L) 03/04/2021   Lab Results  Component Value Date   WBC 6.1 11/06/2020   HGB 13.0 11/06/2020   HCT 38.8 11/06/2020   MCV 87 11/06/2020   PLT 299 11/06/2020   No results found for: "IRON", "TIBC", "FERRITIN"  Attestation Statements:   Reviewed by clinician on day of visit: allergies, medications, problem list, medical history, surgical history, family history, social history, and previous encounter notes.   I, Trixie Dredge, am acting as transcriptionist for Dennard Nip, MD.  I have reviewed the above documentation for accuracy and completeness, and I agree with the above. -  Dennard Nip, MD

## 2022-06-18 ENCOUNTER — Ambulatory Visit (INDEPENDENT_AMBULATORY_CARE_PROVIDER_SITE_OTHER): Payer: BC Managed Care – PPO | Admitting: Family Medicine

## 2022-06-18 VITALS — BP 106/69 | HR 73 | Temp 98.2°F | Ht 61.0 in | Wt 172.0 lb

## 2022-06-18 DIAGNOSIS — E039 Hypothyroidism, unspecified: Secondary | ICD-10-CM

## 2022-06-18 DIAGNOSIS — E782 Mixed hyperlipidemia: Secondary | ICD-10-CM

## 2022-06-18 DIAGNOSIS — Z6832 Body mass index (BMI) 32.0-32.9, adult: Secondary | ICD-10-CM

## 2022-06-18 DIAGNOSIS — E88819 Insulin resistance, unspecified: Secondary | ICD-10-CM

## 2022-06-18 DIAGNOSIS — E669 Obesity, unspecified: Secondary | ICD-10-CM

## 2022-06-18 DIAGNOSIS — F3289 Other specified depressive episodes: Secondary | ICD-10-CM

## 2022-06-18 DIAGNOSIS — E559 Vitamin D deficiency, unspecified: Secondary | ICD-10-CM | POA: Diagnosis not present

## 2022-06-18 MED ORDER — VITAMIN D (ERGOCALCIFEROL) 1.25 MG (50000 UNIT) PO CAPS: ORAL_CAPSULE | ORAL | 0 refills | Status: DC

## 2022-06-18 MED ORDER — ATORVASTATIN CALCIUM 10 MG PO TABS: 10.0000 mg | ORAL_TABLET | Freq: Every day | ORAL | 0 refills | Status: DC

## 2022-06-18 MED ORDER — LEVOTHYROXINE SODIUM 75 MCG PO TABS: 75.0000 ug | ORAL_TABLET | Freq: Every day | ORAL | 0 refills | Status: DC

## 2022-06-18 MED ORDER — BUPROPION HCL ER (SR) 200 MG PO TB12: 200.0000 mg | ORAL_TABLET | Freq: Every day | ORAL | 0 refills | Status: DC

## 2022-06-18 MED ORDER — GEMFIBROZIL 600 MG PO TABS: 600.0000 mg | ORAL_TABLET | Freq: Two times a day (BID) | ORAL | 0 refills | Status: DC

## 2022-06-20 ENCOUNTER — Other Ambulatory Visit: Payer: Self-pay | Admitting: Family Medicine

## 2022-06-20 DIAGNOSIS — Z1231 Encounter for screening mammogram for malignant neoplasm of breast: Secondary | ICD-10-CM

## 2022-07-01 NOTE — Progress Notes (Signed)
Chief Complaint:   OBESITY Betty Porter is here to discuss her progress with her obesity treatment plan along with follow-up of her obesity related diagnoses. Betty Porter is on the Category 3 Plan and states she is following her eating plan approximately 50% of the time. Betty Porter states she is doing Crossfit for 60 minutes 3 times per week.  Today's visit was #: 20 Starting weight: 178 lbs Starting date: 11/06/2020 Today's weight: 172 lbs Today's date: 06/18/2022 Total lbs lost to date: 6 Total lbs lost since last in-office visit: 0  Interim History: Anel has a lot of work stress lately, and struggling with cravings. She has not been able to go to the gym as much lately, as she was moving to a new home.   Subjective:   1. Vitamin D deficiency Betty Porter is taking Vitamin D prescription with no side effects noted.   2. Hypothyroidism, unspecified type Betty Porter is taking Synthroid with no side effects.   3. Mixed hyperlipidemia Betty Porter is taking Lipitor and Lopid with no side effects noted.   4. Insulin resistance Betty Porter is working on her diet, weight loss, and exercise. Her last A1c was 5.6 and insulin level 15.6.  5. Other depression with emotional eating Betty Porter is taking Wellbutrin with no side effects.   Assessment/Plan:   1. Vitamin D deficiency Betty Porter will continue prescription Vitamin D 50,000 IU every week, and we will refill for 1 month. We will recheck fasting labs prior to her next visit.   - Vitamin D, Ergocalciferol, (DRISDOL) 1.25 MG (50000 UNIT) CAPS capsule; TAKE ONE CAPSULE BY MOUTH ONCE WEEKLY (EVERY 7 DAYS)  Dispense: 4 capsule; Refill: 0 - VITAMIN D 25 Hydroxy (Vit-D Deficiency, Fractures)  2. Hypothyroidism, unspecified type Betty Porter will continue Synthroid 75 mcg daily, and we will refill for 1 month. We will recheck fasting labs prior to her next visit.   - levothyroxine (SYNTHROID) 75 MCG tablet; Take 1 tablet (75 mcg total) by mouth daily before  breakfast.  Dispense: 30 tablet; Refill: 0 - TSH  3. Mixed hyperlipidemia Betty Porter will continue her medications, diet, and exercise. We will refill Lipitor and Lopid for 1 month. We will recheck fasting labs prior to her next visit.   - atorvastatin (LIPITOR) 10 MG tablet; Take 1 tablet (10 mg total) by mouth at bedtime.  Dispense: 30 tablet; Refill: 0 - gemfibrozil (LOPID) 600 MG tablet; Take 1 tablet (600 mg total) by mouth in the morning and at bedtime.  Dispense: 60 tablet; Refill: 0 - Lipid Panel With LDL/HDL Ratio  4. Insulin resistance We will recheck fasting labs a couple of days prior to her next visit in 4 weeks.   - CMP14+EGFR - Hemoglobin A1c - Insulin, random  5. Other depression with emotional eating Betty Porter will continue Wellbutrin, diet, and exercise. We will refill Wellbutrin SR for 1 month.   - buPROPion (WELLBUTRIN SR) 200 MG 12 hr tablet; Take 1 tablet (200 mg total) by mouth daily.  Dispense: 30 tablet; Refill: 0  6. Obesity, Current BMI 32.6 Betty Porter is currently in the action stage of change. As such, her goal is to continue with weight loss efforts. She has agreed to the Category 3 Plan.   Lab orders were put in today for future testing. Patient is to come prior to her next visit for fasting labs in the morning a couple of days prior to her visit.   Exercise goals: As is.   Behavioral modification strategies: increasing lean protein intake, decreasing  simple carbohydrates, and holiday eating strategies .  Betty Porter has agreed to follow-up with our clinic in 4 weeks. She was informed of the importance of frequent follow-up visits to maximize her success with intensive lifestyle modifications for her multiple health conditions.   Objective:   Blood pressure 106/69, pulse 73, temperature 98.2 F (36.8 C), height _0  (1.549 m), weight 172 lb (78 kg), SpO2 97 %. Body mass index is 32.5 kg/m.  General: Cooperative, alert, well developed, in no acute  distress. HEENT: Conjunctivae and lids unremarkable. Cardiovascular: Regular rhythm.  Lungs: Normal work of breathing. Neurologic: No focal deficits.   Lab Results  Component Value Date   CREATININE 1.06 (H) 02/25/2022   BUN 13 02/25/2022   NA 137 02/25/2022   K 4.7 02/25/2022   CL 102 02/25/2022   CO2 21 02/25/2022   Lab Results  Component Value Date   ALT 25 02/25/2022   AST 27 02/25/2022   ALKPHOS 73 02/25/2022   BILITOT 0.2 02/25/2022   Lab Results  Component Value Date   HGBA1C 5.6 02/25/2022   HGBA1C 5.6 10/10/2021   HGBA1C 5.4 03/04/2021   HGBA1C 5.7 (H) 11/06/2020   Lab Results  Component Value Date   INSULIN 16.4 02/25/2022   INSULIN 13.3 10/10/2021   INSULIN 14.2 03/04/2021   INSULIN 11.9 11/06/2020   Lab Results  Component Value Date   TSH 1.770 02/25/2022   Lab Results  Component Value Date   CHOL 195 02/25/2022   HDL 47 02/25/2022   LDLCALC 110 (H) 02/25/2022   TRIG 221 (H) 02/25/2022   Lab Results  Component Value Date   VD25OH 36.0 02/25/2022   VD25OH 22.4 (L) 10/10/2021   VD25OH 29.5 (L) 03/04/2021   Lab Results  Component Value Date   WBC 6.1 11/06/2020   HGB 13.0 11/06/2020   HCT 38.8 11/06/2020   MCV 87 11/06/2020   PLT 299 11/06/2020   No results found for: "IRON", "TIBC", "FERRITIN"  Attestation Statements:   Reviewed by clinician on day of visit: allergies, medications, problem list, medical history, surgical history, family history, social history, and previous encounter notes.   I, Trixie Dredge, am acting as transcriptionist for Dennard Nip, MD.  I have reviewed the above documentation for accuracy and completeness, and I agree with the above. -  Dennard Nip, MD

## 2022-07-10 LAB — CMP14+EGFR
ALT: 24 IU/L (ref 0–32)
AST: 24 IU/L (ref 0–40)
Albumin/Globulin Ratio: 1.5 (ref 1.2–2.2)
Albumin: 4.3 g/dL (ref 3.9–4.9)
Alkaline Phosphatase: 77 IU/L (ref 44–121)
BUN/Creatinine Ratio: 14 (ref 9–23)
BUN: 15 mg/dL (ref 6–24)
Bilirubin Total: 0.3 mg/dL (ref 0.0–1.2)
CO2: 21 mmol/L (ref 20–29)
Calcium: 9.4 mg/dL (ref 8.7–10.2)
Chloride: 105 mmol/L (ref 96–106)
Creatinine, Ser: 1.05 mg/dL — ABNORMAL HIGH (ref 0.57–1.00)
Globulin, Total: 2.9 g/dL (ref 1.5–4.5)
Glucose: 89 mg/dL (ref 70–99)
Potassium: 4.2 mmol/L (ref 3.5–5.2)
Sodium: 139 mmol/L (ref 134–144)
Total Protein: 7.2 g/dL (ref 6.0–8.5)
eGFR: 65 mL/min/{1.73_m2} (ref >59)

## 2022-07-10 LAB — HEMOGLOBIN A1C
Est. average glucose Bld gHb Est-mCnc: 117 mg/dL
Hgb A1c MFr Bld: 5.7 % — ABNORMAL HIGH (ref 4.8–5.6)

## 2022-07-10 LAB — LIPID PANEL WITH LDL/HDL RATIO
Cholesterol, Total: 190 mg/dL (ref 100–199)
HDL: 58 mg/dL (ref >39)
LDL Chol Calc (NIH): 110 mg/dL — ABNORMAL HIGH (ref 0–99)
LDL/HDL Ratio: 1.9 ratio (ref 0.0–3.2)
Triglycerides: 126 mg/dL (ref 0–149)
VLDL Cholesterol Cal: 22 mg/dL (ref 5–40)

## 2022-07-10 LAB — TSH: TSH: 4.41 u[IU]/mL (ref 0.450–4.500)

## 2022-07-10 LAB — INSULIN, RANDOM: INSULIN: 12.9 u[IU]/mL (ref 2.6–24.9)

## 2022-07-10 LAB — VITAMIN D 25 HYDROXY (VIT D DEFICIENCY, FRACTURES): Vit D, 25-Hydroxy: 30.1 ng/mL (ref 30.0–100.0)

## 2022-07-16 ENCOUNTER — Ambulatory Visit (INDEPENDENT_AMBULATORY_CARE_PROVIDER_SITE_OTHER): Payer: BC Managed Care – PPO | Admitting: Family Medicine

## 2022-07-16 ENCOUNTER — Encounter (INDEPENDENT_AMBULATORY_CARE_PROVIDER_SITE_OTHER): Payer: Self-pay | Admitting: Family Medicine

## 2022-07-16 VITALS — BP 118/73 | HR 78 | Temp 98.2°F | Ht 61.0 in | Wt 173.0 lb

## 2022-07-16 DIAGNOSIS — E039 Hypothyroidism, unspecified: Secondary | ICD-10-CM | POA: Diagnosis not present

## 2022-07-16 DIAGNOSIS — F3289 Other specified depressive episodes: Secondary | ICD-10-CM | POA: Diagnosis not present

## 2022-07-16 DIAGNOSIS — E559 Vitamin D deficiency, unspecified: Secondary | ICD-10-CM

## 2022-07-16 DIAGNOSIS — E782 Mixed hyperlipidemia: Secondary | ICD-10-CM

## 2022-07-16 DIAGNOSIS — Z6832 Body mass index (BMI) 32.0-32.9, adult: Secondary | ICD-10-CM

## 2022-07-16 DIAGNOSIS — E669 Obesity, unspecified: Secondary | ICD-10-CM

## 2022-07-19 ENCOUNTER — Other Ambulatory Visit (INDEPENDENT_AMBULATORY_CARE_PROVIDER_SITE_OTHER): Payer: Self-pay | Admitting: Family Medicine

## 2022-07-19 DIAGNOSIS — E559 Vitamin D deficiency, unspecified: Secondary | ICD-10-CM

## 2022-07-21 ENCOUNTER — Other Ambulatory Visit (INDEPENDENT_AMBULATORY_CARE_PROVIDER_SITE_OTHER): Payer: Self-pay | Admitting: Family Medicine

## 2022-07-21 ENCOUNTER — Ambulatory Visit
Admission: RE | Admit: 2022-07-21 | Discharge: 2022-07-21 | Disposition: A | Discharge status: Home / Self Care | Payer: BC Managed Care – PPO | Source: Ambulatory Visit | Attending: Family Medicine | Admitting: Family Medicine

## 2022-07-21 DIAGNOSIS — E782 Mixed hyperlipidemia: Secondary | ICD-10-CM

## 2022-07-21 DIAGNOSIS — Z1231 Encounter for screening mammogram for malignant neoplasm of breast: Secondary | ICD-10-CM

## 2022-07-28 NOTE — Progress Notes (Unsigned)
Chief Complaint:   OBESITY Betty Porter is here to discuss her progress with her obesity treatment plan along with follow-up of her obesity related diagnoses. Betty Porter is on the Category 3 Plan and states she is following her eating plan approximately 60% of the time. Betty Porter states she is doing Crossfit for 60 minutes 3 times per week.  Today's visit was #: 21 Starting weight: 178 lbs Starting date: 11/06/2020 Today's weight: 173 lbs Today's date: 07/16/2022 Total lbs lost to date: 5 Total lbs lost since last in-office visit: 0  Interim History: Betty Porter has done some celebration eating over Thanksgiving, but she has tried to portion control despite extra temptations.  She is making strategies to avoid weight gain over Christmas.  Subjective:   1. Mixed hyperlipidemia Betty Porter's last HDL and triglycerides have improved, her LDL is better but still above goal.  I discussed labs with the patient today.  2. Hypothyroidism, unspecified type Betty Porter is stable on Synthroid, and her last TSH was within normal limits.  I discussed labs with the patient today.  3. Vitamin D deficiency Betty Porter's vitamin D level is worsening and is low due to her forgetting to take some doses of her vitamin D.  I discussed labs with the patient today.  4. Other depression with emotional eating Betty Porter is struggling with increased simple carbohydrates and sugar cravings, and she feels her medications are not helping as much.  Assessment/Plan:   1. Mixed hyperlipidemia Betty Porter will continue her medications, and we will refill Lipitor 10 mg daily and gemfibrozil 600 mg daily for 1 month.  2. Hypothyroidism, unspecified type Betty Porter will continue Synthroid 75 mcg daily, and we will refill for 1 month.  3. Vitamin D deficiency Betty Porter will continue prescription Vitamin D 50,000 IU every week, and we will refill for 1 month.  She will work on taking her vitamin D more regularly.  4. Other depression with emotional  eating Betty Porter agreed to increase Wellbutrin SR to 200 mg BID, and we will refill for 1 month.   5. Obesity, Current BMI 32.8 Betty Porter is currently in the action stage of change. As such, her goal is to continue with weight loss efforts. She has agreed to the Category 3 Plan.   Exercise goals: As is.   Behavioral modification strategies: increasing lean protein intake, dealing with family or coworker sabotage, holiday eating strategies , and celebration eating strategies.  Betty Porter has agreed to follow-up with our clinic in 4 weeks. She was informed of the importance of frequent follow-up visits to maximize her success with intensive lifestyle modifications for her multiple health conditions.   Objective:   Blood pressure 118/73, pulse 78, temperature 98.2 F (36.8 C), height '5\' 1"'$  (1.549 m), weight 173 lb (78.5 kg), SpO2 98 %. Body mass index is 32.69 kg/m.  General: Cooperative, alert, well developed, in no acute distress. HEENT: Conjunctivae and lids unremarkable. Cardiovascular: Regular rhythm.  Lungs: Normal work of breathing. Neurologic: No focal deficits.   Lab Results  Component Value Date   CREATININE 1.05 (H) 07/09/2022   BUN 15 07/09/2022   NA 139 07/09/2022   K 4.2 07/09/2022   CL 105 07/09/2022   CO2 21 07/09/2022   Lab Results  Component Value Date   ALT 24 07/09/2022   AST 24 07/09/2022   ALKPHOS 77 07/09/2022   BILITOT 0.3 07/09/2022   Lab Results  Component Value Date   HGBA1C 5.7 (H) 07/09/2022   HGBA1C 5.6 02/25/2022   HGBA1C 5.6  10/10/2021   HGBA1C 5.4 03/04/2021   HGBA1C 5.7 (H) 11/06/2020   Lab Results  Component Value Date   INSULIN 12.9 07/09/2022   INSULIN 16.4 02/25/2022   INSULIN 13.3 10/10/2021   INSULIN 14.2 03/04/2021   INSULIN 11.9 11/06/2020   Lab Results  Component Value Date   TSH 4.410 07/09/2022   Lab Results  Component Value Date   CHOL 190 07/09/2022   HDL 58 07/09/2022   LDLCALC 110 (H) 07/09/2022   TRIG 126  07/09/2022   Lab Results  Component Value Date   VD25OH 30.1 07/09/2022   VD25OH 36.0 02/25/2022   VD25OH 22.4 (L) 10/10/2021   Lab Results  Component Value Date   WBC 6.1 11/06/2020   HGB 13.0 11/06/2020   HCT 38.8 11/06/2020   MCV 87 11/06/2020   PLT 299 11/06/2020   No results found for: "IRON", "TIBC", "FERRITIN"  Attestation Statements:   Reviewed by clinician on day of visit: allergies, medications, problem list, medical history, surgical history, family history, social history, and previous encounter notes.   I, Trixie Dredge, am acting as transcriptionist for Dennard Nip, MD.  I have reviewed the above documentation for accuracy and completeness, and I agree with the above. -  Dennard Nip, MD

## 2022-07-29 MED ORDER — LEVOTHYROXINE SODIUM 75 MCG PO TABS: 75.0000 ug | ORAL_TABLET | Freq: Every day | ORAL | 0 refills | Status: DC

## 2022-07-29 MED ORDER — ATORVASTATIN CALCIUM 10 MG PO TABS: 10.0000 mg | ORAL_TABLET | Freq: Every day | ORAL | 0 refills | Status: DC

## 2022-07-29 MED ORDER — VITAMIN D (ERGOCALCIFEROL) 1.25 MG (50000 UNIT) PO CAPS: ORAL_CAPSULE | ORAL | 0 refills | Status: DC

## 2022-07-29 MED ORDER — BUPROPION HCL ER (SR) 200 MG PO TB12: 200.0000 mg | ORAL_TABLET | Freq: Two times a day (BID) | ORAL | 0 refills | Status: DC

## 2022-07-29 MED ORDER — GEMFIBROZIL 600 MG PO TABS: 600.0000 mg | ORAL_TABLET | Freq: Two times a day (BID) | ORAL | 0 refills | Status: DC

## 2022-08-21 ENCOUNTER — Ambulatory Visit (INDEPENDENT_AMBULATORY_CARE_PROVIDER_SITE_OTHER): Payer: BC Managed Care – PPO | Admitting: Family Medicine

## 2022-08-21 ENCOUNTER — Encounter (INDEPENDENT_AMBULATORY_CARE_PROVIDER_SITE_OTHER): Payer: Self-pay | Admitting: Family Medicine

## 2022-08-21 VITALS — BP 122/80 | HR 77 | Temp 98.9°F | Ht 61.0 in | Wt 177.0 lb

## 2022-08-21 DIAGNOSIS — E559 Vitamin D deficiency, unspecified: Secondary | ICD-10-CM

## 2022-08-21 DIAGNOSIS — E7849 Other hyperlipidemia: Secondary | ICD-10-CM | POA: Diagnosis not present

## 2022-08-21 DIAGNOSIS — F3289 Other specified depressive episodes: Secondary | ICD-10-CM | POA: Diagnosis not present

## 2022-08-21 DIAGNOSIS — Z6833 Body mass index (BMI) 33.0-33.9, adult: Secondary | ICD-10-CM

## 2022-08-21 DIAGNOSIS — E038 Other specified hypothyroidism: Secondary | ICD-10-CM

## 2022-08-21 DIAGNOSIS — E669 Obesity, unspecified: Secondary | ICD-10-CM

## 2022-08-21 MED ORDER — BUPROPION HCL ER (SR) 150 MG PO TB12: 150.0000 mg | ORAL_TABLET | Freq: Two times a day (BID) | ORAL | 0 refills | Status: DC

## 2022-08-21 MED ORDER — LEVOTHYROXINE SODIUM 75 MCG PO TABS: 75.0000 ug | ORAL_TABLET | Freq: Every day | ORAL | 0 refills | Status: DC

## 2022-08-21 MED ORDER — GEMFIBROZIL 600 MG PO TABS: 600.0000 mg | ORAL_TABLET | Freq: Two times a day (BID) | ORAL | 0 refills | Status: DC

## 2022-08-21 MED ORDER — ZEPBOUND 2.5 MG/0.5ML ~~LOC~~ SOAJ: 2.5000 mg | SUBCUTANEOUS | 0 refills | Status: DC

## 2022-08-21 MED ORDER — VITAMIN D (ERGOCALCIFEROL) 1.25 MG (50000 UNIT) PO CAPS: ORAL_CAPSULE | ORAL | 0 refills | Status: DC

## 2022-08-21 MED ORDER — ATORVASTATIN CALCIUM 10 MG PO TABS: 10.0000 mg | ORAL_TABLET | Freq: Every day | ORAL | 0 refills | Status: DC

## 2022-08-27 ENCOUNTER — Encounter (INDEPENDENT_AMBULATORY_CARE_PROVIDER_SITE_OTHER): Payer: Self-pay | Admitting: Family Medicine

## 2022-09-01 NOTE — Progress Notes (Unsigned)
Chief Complaint:   OBESITY Betty Porter is here to discuss her progress with her obesity treatment plan along with follow-up of her obesity related diagnoses. Laketa is on the Category 3 Plan and states she is following her eating plan approximately 0% of the time. Azhar states she is doing Crossfit for 60 minutes 2-3 times per week.  Today's visit was #: 22 Starting weight: 178 lbs Starting date: 11/06/2020 Today's weight: 177 lbs Today's date: 08/21/2022 Total lbs lost to date: 1 Total lbs lost since last in-office visit: 0  Interim History: Betty Porter is working on her weight loss but she is struggling.  She was not following her eating plan over the holidays, but she is ready to get back on track.  She is interested in starting a GLP-1 medication to help decrease her appetite.  Subjective:   1. Other hyperlipidemia Betty Porter is trying to improve her cholesterol levels by decreasing high cholesterol foods in her diet.  She is stable on her medications.  2. Other specified hypothyroidism Betty Porter is on levothyroxine with no palpitation or tremors noted.  3. Vitamin D deficiency Betty Porter is on vitamin D, and she is doing well.  Her last vitamin D level was at goal.  4. Other depression with emotional eating Betty Porter is working on decreasing emotional eating behaviors.  She has no problems with Wellbutrin.  Assessment/Plan:   1. Other hyperlipidemia Betty Porter will continue her medications, and we will refill Lipitor and Lopid for 1 month.  - atorvastatin (LIPITOR) 10 MG tablet; Take 1 tablet (10 mg total) by mouth at bedtime.  Dispense: 30 tablet; Refill: 0 - gemfibrozil (LOPID) 600 MG tablet; Take 1 tablet (600 mg total) by mouth in the morning and at bedtime.  Dispense: 60 tablet; Refill: 0  2. Other specified hypothyroidism Betty Porter will continue levothyroxine, and we will refill for 1 month.  - levothyroxine (SYNTHROID) 75 MCG tablet; Take 1 tablet (75 mcg total) by mouth daily  before breakfast.  Dispense: 30 tablet; Refill: 0  3. Vitamin D deficiency Betty Porter will continue prescription vitamin D, and we will refill for 1 month.  - Vitamin D, Ergocalciferol, (DRISDOL) 1.25 MG (50000 UNIT) CAPS capsule; TAKE ONE CAPSULE BY MOUTH ONCE WEEKLY (EVERY 7 DAYS)  Dispense: 4 capsule; Refill: 0  4. Other depression with emotional eating Betty Porter will continue Wellbutrin SR, and we will refill for 1 month.  - buPROPion (WELLBUTRIN SR) 150 MG 12 hr tablet; Take 1 tablet (150 mg total) by mouth 2 (two) times daily.  Dispense: 60 tablet; Refill: 0  5. Obesity,current BMI 33.6 Audrinna agreed to start Zepbound 2.5 mg once weekly, with no refills.   - tirzepatide (ZEPBOUND) 2.5 MG/0.5ML Pen; Inject 2.5 mg into the skin once a week.  Dispense: 2 mL; Refill: 0  Betty Porter is currently in the action stage of change. As such, her goal is to continue with weight loss efforts. She has agreed to the Category 3 Plan.   Exercise goals: As is.   Behavioral modification strategies: increasing lean protein intake.  Betty Porter has agreed to follow-up with our clinic in 4 weeks. She was informed of the importance of frequent follow-up visits to maximize her success with intensive lifestyle modifications for her multiple health conditions.   Objective:   Blood pressure 122/80, pulse 77, temperature 98.9 F (37.2 C), height '5\' 1"'$  (1.549 m), weight 177 lb (80.3 kg), SpO2 96 %. Body mass index is 33.44 kg/m.  General: Cooperative, alert, well developed, in no  acute distress. HEENT: Conjunctivae and lids unremarkable. Cardiovascular: Regular rhythm.  Lungs: Normal work of breathing. Neurologic: No focal deficits.   Lab Results  Component Value Date   CREATININE 1.05 (H) 07/09/2022   BUN 15 07/09/2022   NA 139 07/09/2022   K 4.2 07/09/2022   CL 105 07/09/2022   CO2 21 07/09/2022   Lab Results  Component Value Date   ALT 24 07/09/2022   AST 24 07/09/2022   ALKPHOS 77 07/09/2022    BILITOT 0.3 07/09/2022   Lab Results  Component Value Date   HGBA1C 5.7 (H) 07/09/2022   HGBA1C 5.6 02/25/2022   HGBA1C 5.6 10/10/2021   HGBA1C 5.4 03/04/2021   HGBA1C 5.7 (H) 11/06/2020   Lab Results  Component Value Date   INSULIN 12.9 07/09/2022   INSULIN 16.4 02/25/2022   INSULIN 13.3 10/10/2021   INSULIN 14.2 03/04/2021   INSULIN 11.9 11/06/2020   Lab Results  Component Value Date   TSH 4.410 07/09/2022   Lab Results  Component Value Date   CHOL 190 07/09/2022   HDL 58 07/09/2022   LDLCALC 110 (H) 07/09/2022   TRIG 126 07/09/2022   Lab Results  Component Value Date   VD25OH 30.1 07/09/2022   VD25OH 36.0 02/25/2022   VD25OH 22.4 (L) 10/10/2021   Lab Results  Component Value Date   WBC 6.1 11/06/2020   HGB 13.0 11/06/2020   HCT 38.8 11/06/2020   MCV 87 11/06/2020   PLT 299 11/06/2020   No results found for: "IRON", "TIBC", "FERRITIN"  Attestation Statements:   Reviewed by clinician on day of visit: allergies, medications, problem list, medical history, surgical history, family history, social history, and previous encounter notes.   I, Trixie Dredge, am acting as transcriptionist for Dennard Nip, MD.  I have reviewed the above documentation for accuracy and completeness, and I agree with the above. -  Dennard Nip, MD

## 2022-09-18 ENCOUNTER — Ambulatory Visit (INDEPENDENT_AMBULATORY_CARE_PROVIDER_SITE_OTHER): Payer: BC Managed Care – PPO | Admitting: Family Medicine

## 2022-09-18 ENCOUNTER — Encounter (INDEPENDENT_AMBULATORY_CARE_PROVIDER_SITE_OTHER): Payer: Self-pay | Admitting: Family Medicine

## 2022-09-18 VITALS — BP 120/78 | HR 68 | Temp 97.9°F | Ht 61.0 in | Wt 176.0 lb

## 2022-09-18 DIAGNOSIS — E559 Vitamin D deficiency, unspecified: Secondary | ICD-10-CM | POA: Diagnosis not present

## 2022-09-18 DIAGNOSIS — R7303 Prediabetes: Secondary | ICD-10-CM

## 2022-09-18 DIAGNOSIS — Z6831 Body mass index (BMI) 31.0-31.9, adult: Secondary | ICD-10-CM | POA: Insufficient documentation

## 2022-09-18 DIAGNOSIS — E7849 Other hyperlipidemia: Secondary | ICD-10-CM | POA: Diagnosis not present

## 2022-09-18 DIAGNOSIS — E669 Obesity, unspecified: Secondary | ICD-10-CM

## 2022-09-18 DIAGNOSIS — Z6833 Body mass index (BMI) 33.0-33.9, adult: Secondary | ICD-10-CM

## 2022-09-18 DIAGNOSIS — E038 Other specified hypothyroidism: Secondary | ICD-10-CM

## 2022-09-18 DIAGNOSIS — F3289 Other specified depressive episodes: Secondary | ICD-10-CM

## 2022-09-18 MED ORDER — GEMFIBROZIL 600 MG PO TABS: 600.0000 mg | ORAL_TABLET | Freq: Two times a day (BID) | ORAL | 0 refills | Status: DC

## 2022-09-18 MED ORDER — BUPROPION HCL ER (SR) 150 MG PO TB12: 150.0000 mg | ORAL_TABLET | Freq: Two times a day (BID) | ORAL | 0 refills | Status: DC

## 2022-09-18 MED ORDER — METFORMIN HCL 500 MG PO TABS: 500.0000 mg | ORAL_TABLET | Freq: Every day | ORAL | 0 refills | Status: DC

## 2022-09-18 MED ORDER — LEVOTHYROXINE SODIUM 75 MCG PO TABS: 75.0000 ug | ORAL_TABLET | Freq: Every day | ORAL | 0 refills | Status: DC

## 2022-09-18 MED ORDER — ATORVASTATIN CALCIUM 10 MG PO TABS: 10.0000 mg | ORAL_TABLET | Freq: Every day | ORAL | 0 refills | Status: DC

## 2022-09-18 MED ORDER — ZEPBOUND 2.5 MG/0.5ML ~~LOC~~ SOAJ: 2.5000 mg | SUBCUTANEOUS | 0 refills | Status: DC

## 2022-09-18 MED ORDER — VITAMIN D (ERGOCALCIFEROL) 1.25 MG (50000 UNIT) PO CAPS: ORAL_CAPSULE | ORAL | 0 refills | Status: DC

## 2022-09-21 ENCOUNTER — Other Ambulatory Visit (INDEPENDENT_AMBULATORY_CARE_PROVIDER_SITE_OTHER): Payer: Self-pay | Admitting: Family Medicine

## 2022-09-21 DIAGNOSIS — F3289 Other specified depressive episodes: Secondary | ICD-10-CM

## 2022-10-02 NOTE — Progress Notes (Signed)
Chief Complaint:   OBESITY Betty Porter is here to discuss her progress with her obesity treatment plan along with follow-up of her obesity related diagnoses. Betty Porter is on the Category 3 Plan and states she is following her eating plan approximately 50% of the time. Betty Porter states she is doing crossfit for 60 minutes 3-4 times per week.  Today's visit was #: 23 Starting weight: 178 lbs Starting date: 11/06/2020 Today's weight: 176 lbs Today's date: 09/18/2022 Total lbs lost to date: 2 Total lbs lost since last in-office visit: 1  Interim History: Betty Porter is working on her diet but she is still struggling at times. Her insurance failed to cover Zepbound. She is exercising very well and plans to go on vacation soon.   Subjective:   1. Other hyperlipidemia Betty Porter is working on her diet, with no side effects with her medications.   2. Prediabetes Betty Porter is struggling with some polyphagia. Her insurance will not cover GLP-1.  3. Other specified hypothyroidism Betty Porter is stable on Synthroid. She has no signs of over or under-replacement.   4. Vitamin D deficiency Betty Porter is stable on Vitamin D with no side effects noted.   5. Emotional Eating Behavior Betty Porter has done better with taking her Wellbutrin and she notes she is decreasing emotional eating behaviors. No side effects were noted.   Assessment/Plan:   1. Other hyperlipidemia Betty Porter will continue her medications, and we will refill Lipitor and Lopid for 1 month. We will recheck labs in 1 month.   - atorvastatin (LIPITOR) 10 MG tablet; Take 1 tablet (10 mg total) by mouth at bedtime.  Dispense: 30 tablet; Refill: 0 - gemfibrozil (LOPID) 600 MG tablet; Take 1 tablet (600 mg total) by mouth in the morning and at bedtime.  Dispense: 60 tablet; Refill: 0  2. Prediabetes Betty Porter agreed to start metformin 500 mg q AM with food, with no refills. We will recheck labs in 1 month.   - metFORMIN (GLUCOPHAGE) 500 MG tablet; Take 1  tablet (500 mg total) by mouth daily.  Dispense: 30 tablet; Refill: 0  3. Other specified hypothyroidism We will refill Synthroid for 1 month, and we will recheck labs in 1 month.   - levothyroxine (SYNTHROID) 75 MCG tablet; Take 1 tablet (75 mcg total) by mouth daily before breakfast.  Dispense: 30 tablet; Refill: 0  4. Vitamin D deficiency Betty Porter will continue prescription Vitamin D, and we will refill for 1 month.   - Vitamin D, Ergocalciferol, (DRISDOL) 1.25 MG (50000 UNIT) CAPS capsule; TAKE ONE CAPSULE BY MOUTH ONCE WEEKLY (EVERY 7 DAYS)  Dispense: 4 capsule; Refill: 0  5. Emotional Eating Behavior Betty Porter will continue Wellbutrin SR, and we will refill for 1 month.   - buPROPion (WELLBUTRIN SR) 150 MG 12 hr tablet; Take 1 tablet (150 mg total) by mouth 2 (two) times daily.  Dispense: 60 tablet; Refill: 0  6. BMI 33.0-33.9,adult  7. Obesity, Beginning BMI 33.63  - tirzepatide (ZEPBOUND) 2.5 MG/0.5ML Pen; Inject 2.5 mg into the skin once a week.  Dispense: 2 mL; Refill: 0  Betty Porter is currently in the action stage of change. As such, her goal is to continue with weight loss efforts. She has agreed to the Category 3 Plan.   Exercise goals: As is.   Behavioral modification strategies: increasing lean protein intake, travel eating strategies, and celebration eating strategies.  Betty Porter has agreed to follow-up with our clinic in 4 weeks. She was informed of the importance of frequent follow-up visits  to maximize her success with intensive lifestyle modifications for her multiple health conditions.   Objective:   Blood pressure 120/78, pulse 68, temperature 97.9 F (36.6 C), height 5' 1"$  (1.549 m), weight 176 lb (79.8 kg), SpO2 98 %. Body mass index is 33.25 kg/m.  General: Cooperative, alert, well developed, in no acute distress. HEENT: Conjunctivae and lids unremarkable. Cardiovascular: Regular rhythm.  Lungs: Normal work of breathing. Neurologic: No focal deficits.    Lab Results  Component Value Date   CREATININE 1.05 (H) 07/09/2022   BUN 15 07/09/2022   NA 139 07/09/2022   K 4.2 07/09/2022   CL 105 07/09/2022   CO2 21 07/09/2022   Lab Results  Component Value Date   ALT 24 07/09/2022   AST 24 07/09/2022   ALKPHOS 77 07/09/2022   BILITOT 0.3 07/09/2022   Lab Results  Component Value Date   HGBA1C 5.7 (H) 07/09/2022   HGBA1C 5.6 02/25/2022   HGBA1C 5.6 10/10/2021   HGBA1C 5.4 03/04/2021   HGBA1C 5.7 (H) 11/06/2020   Lab Results  Component Value Date   INSULIN 12.9 07/09/2022   INSULIN 16.4 02/25/2022   INSULIN 13.3 10/10/2021   INSULIN 14.2 03/04/2021   INSULIN 11.9 11/06/2020   Lab Results  Component Value Date   TSH 4.410 07/09/2022   Lab Results  Component Value Date   CHOL 190 07/09/2022   HDL 58 07/09/2022   LDLCALC 110 (H) 07/09/2022   TRIG 126 07/09/2022   Lab Results  Component Value Date   VD25OH 30.1 07/09/2022   VD25OH 36.0 02/25/2022   VD25OH 22.4 (L) 10/10/2021   Lab Results  Component Value Date   WBC 6.1 11/06/2020   HGB 13.0 11/06/2020   HCT 38.8 11/06/2020   MCV 87 11/06/2020   PLT 299 11/06/2020   No results found for: "IRON", "TIBC", "FERRITIN"  Attestation Statements:   Reviewed by clinician on day of visit: allergies, medications, problem list, medical history, surgical history, family history, social history, and previous encounter notes.   I, Trixie Dredge, am acting as transcriptionist for Dennard Nip, MD.  I have reviewed the above documentation for accuracy and completeness, and I agree with the above. -  Dennard Nip, MD

## 2022-10-16 ENCOUNTER — Encounter (INDEPENDENT_AMBULATORY_CARE_PROVIDER_SITE_OTHER): Payer: Self-pay | Admitting: Family Medicine

## 2022-10-16 ENCOUNTER — Ambulatory Visit (INDEPENDENT_AMBULATORY_CARE_PROVIDER_SITE_OTHER): Payer: BC Managed Care – PPO | Admitting: Family Medicine

## 2022-10-16 VITALS — BP 119/77 | HR 91 | Temp 98.4°F | Ht 61.0 in | Wt 177.0 lb

## 2022-10-16 DIAGNOSIS — E7849 Other hyperlipidemia: Secondary | ICD-10-CM

## 2022-10-16 DIAGNOSIS — Z6833 Body mass index (BMI) 33.0-33.9, adult: Secondary | ICD-10-CM

## 2022-10-16 DIAGNOSIS — E038 Other specified hypothyroidism: Secondary | ICD-10-CM

## 2022-10-16 DIAGNOSIS — R7303 Prediabetes: Secondary | ICD-10-CM

## 2022-10-16 DIAGNOSIS — E559 Vitamin D deficiency, unspecified: Secondary | ICD-10-CM | POA: Diagnosis not present

## 2022-10-16 DIAGNOSIS — F3289 Other specified depressive episodes: Secondary | ICD-10-CM

## 2022-10-16 DIAGNOSIS — E669 Obesity, unspecified: Secondary | ICD-10-CM

## 2022-10-16 MED ORDER — LEVOTHYROXINE SODIUM 75 MCG PO TABS: 75.0000 ug | ORAL_TABLET | Freq: Every day | ORAL | 0 refills | Status: DC

## 2022-10-16 MED ORDER — GEMFIBROZIL 600 MG PO TABS: 600.0000 mg | ORAL_TABLET | Freq: Two times a day (BID) | ORAL | 0 refills | Status: DC

## 2022-10-16 MED ORDER — METFORMIN HCL 500 MG PO TABS: 500.0000 mg | ORAL_TABLET | Freq: Every day | ORAL | 0 refills | Status: DC

## 2022-10-16 MED ORDER — VITAMIN D (ERGOCALCIFEROL) 1.25 MG (50000 UNIT) PO CAPS: ORAL_CAPSULE | ORAL | 0 refills | Status: DC

## 2022-10-16 MED ORDER — BUPROPION HCL ER (SR) 150 MG PO TB12: 150.0000 mg | ORAL_TABLET | Freq: Two times a day (BID) | ORAL | 0 refills | Status: DC

## 2022-10-16 MED ORDER — ATORVASTATIN CALCIUM 10 MG PO TABS: 10.0000 mg | ORAL_TABLET | Freq: Every day | ORAL | 0 refills | Status: DC

## 2022-10-23 ENCOUNTER — Other Ambulatory Visit (INDEPENDENT_AMBULATORY_CARE_PROVIDER_SITE_OTHER): Payer: Self-pay | Admitting: Family Medicine

## 2022-10-23 DIAGNOSIS — R7303 Prediabetes: Secondary | ICD-10-CM

## 2022-10-23 DIAGNOSIS — F3289 Other specified depressive episodes: Secondary | ICD-10-CM

## 2022-10-28 NOTE — Progress Notes (Unsigned)
Chief Complaint:   OBESITY Betty Porter is here to discuss her progress with her obesity treatment plan along with follow-up of her obesity related diagnoses. Betty Porter is on {MWMwtlossportion/plan2:23431} and states she is following her eating plan approximately ***% of the time. Betty Porter states she is *** *** minutes *** times per week.  Today's visit was #: *** Starting weight: *** Starting date: *** Today's weight: *** Today's date: 10/16/2022 Total lbs lost to date: *** Total lbs lost since last in-office visit: ***  Interim History: ***  Subjective:   1. Other hyperlipidemia ***  2. Other specified hypothyroidism ***  3. Vitamin D deficiency ***  4. Prediabetes ***  5. Emotional Eating Behavior ***  Assessment/Plan:   1. Other hyperlipidemia *** - atorvastatin (LIPITOR) 10 MG tablet; Take 1 tablet (10 mg total) by mouth at bedtime.  Dispense: 30 tablet; Refill: 0 - gemfibrozil (LOPID) 600 MG tablet; Take 1 tablet (600 mg total) by mouth in the morning and at bedtime.  Dispense: 60 tablet; Refill: 0 - Lipid Panel With LDL/HDL Ratio - TSH  2. Other specified hypothyroidism *** - levothyroxine (SYNTHROID) 75 MCG tablet; Take 1 tablet (75 mcg total) by mouth daily before breakfast.  Dispense: 30 tablet; Refill: 0  3. Vitamin D deficiency *** - Vitamin D, Ergocalciferol, (DRISDOL) 1.25 MG (50000 UNIT) CAPS capsule; TAKE ONE CAPSULE BY MOUTH ONCE WEEKLY (EVERY 7 DAYS)  Dispense: 4 capsule; Refill: 0 - VITAMIN D 25 Hydroxy (Vit-D Deficiency, Fractures)  4. Prediabetes *** - metFORMIN (GLUCOPHAGE) 500 MG tablet; Take 1 tablet (500 mg total) by mouth daily.  Dispense: 30 tablet; Refill: 0 - CMP14+EGFR - Insulin, random - Hemoglobin A1c - Vitamin B12  5. Emotional Eating Behavior *** - buPROPion (WELLBUTRIN SR) 150 MG 12 hr tablet; Take 1 tablet (150 mg total) by mouth 2 (two) times daily.  Dispense: 60 tablet; Refill: 0  6. BMI 33.0-33.9,adult  7. Obesity,  Beginning BMI 33.63 Betty Porter {CHL AMB IS/IS NOT:210130109} currently in the action stage of change. As such, her goal is to {MWMwtloss#1:210800005}. She has agreed to {MWMwtlossportion/plan2:23431}.   Exercise goals: {MWM EXERCISE RECS:23473}  Behavioral modification strategies: {MWMwtlossdietstrategies3:23432}.  Betty Porter has agreed to follow-up with our clinic in {NUMBER 1-10:22536} weeks. She was informed of the importance of frequent follow-up visits to maximize her success with intensive lifestyle modifications for her multiple health conditions.   Betty Porter was informed we would discuss her lab results at her next visit unless there is a critical issue that needs to be addressed sooner. Betty Porter agreed to keep her next visit at the agreed upon time to discuss these results.  Objective:   Blood pressure 119/77, pulse 91, temperature 98.4 F (36.9 C), height 5\' 1"  (1.549 m), weight 177 lb (80.3 kg), SpO2 97 %. Body mass index is 33.44 kg/m.  Lab Results  Component Value Date   CREATININE 1.05 (H) 07/09/2022   BUN 15 07/09/2022   NA 139 07/09/2022   K 4.2 07/09/2022   CL 105 07/09/2022   CO2 21 07/09/2022   Lab Results  Component Value Date   ALT 24 07/09/2022   AST 24 07/09/2022   ALKPHOS 77 07/09/2022   BILITOT 0.3 07/09/2022   Lab Results  Component Value Date   HGBA1C 5.7 (H) 07/09/2022   HGBA1C 5.6 02/25/2022   HGBA1C 5.6 10/10/2021   HGBA1C 5.4 03/04/2021   HGBA1C 5.7 (H) 11/06/2020   Lab Results  Component Value Date   INSULIN 12.9 07/09/2022  INSULIN 16.4 02/25/2022   INSULIN 13.3 10/10/2021   INSULIN 14.2 03/04/2021   INSULIN 11.9 11/06/2020   Lab Results  Component Value Date   TSH 4.410 07/09/2022   Lab Results  Component Value Date   CHOL 190 07/09/2022   HDL 58 07/09/2022   LDLCALC 110 (H) 07/09/2022   TRIG 126 07/09/2022   Lab Results  Component Value Date   VD25OH 30.1 07/09/2022   VD25OH 36.0 02/25/2022   VD25OH 22.4 (L) 10/10/2021    Lab Results  Component Value Date   WBC 6.1 11/06/2020   HGB 13.0 11/06/2020   HCT 38.8 11/06/2020   MCV 87 11/06/2020   PLT 299 11/06/2020   No results found for: "IRON", "TIBC", "FERRITIN"  Attestation Statements:   Reviewed by clinician on day of visit: allergies, medications, problem list, medical history, surgical history, family history, social history, and previous encounter notes.   I, Trixie Dredge, am acting as transcriptionist for Dennard Nip, MD.  I have reviewed the above documentation for accuracy and completeness, and I agree with the above. -  ***

## 2022-11-20 LAB — LIPID PANEL WITH LDL/HDL RATIO
Cholesterol, Total: 205 mg/dL — ABNORMAL HIGH (ref 100–199)
HDL: 58 mg/dL (ref >39)
LDL Chol Calc (NIH): 125 mg/dL — ABNORMAL HIGH (ref 0–99)
LDL/HDL Ratio: 2.2 ratio (ref 0.0–3.2)
Triglycerides: 126 mg/dL (ref 0–149)
VLDL Cholesterol Cal: 22 mg/dL (ref 5–40)

## 2022-11-20 LAB — CMP14+EGFR
ALT: 17 IU/L (ref 0–32)
AST: 20 IU/L (ref 0–40)
Albumin/Globulin Ratio: 1.7 (ref 1.2–2.2)
Albumin: 4.5 g/dL (ref 3.9–4.9)
Alkaline Phosphatase: 79 IU/L (ref 44–121)
BUN/Creatinine Ratio: 14 (ref 9–23)
BUN: 16 mg/dL (ref 6–24)
Bilirubin Total: 0.3 mg/dL (ref 0.0–1.2)
CO2: 21 mmol/L (ref 20–29)
Calcium: 9.2 mg/dL (ref 8.7–10.2)
Chloride: 104 mmol/L (ref 96–106)
Creatinine, Ser: 1.12 mg/dL — ABNORMAL HIGH (ref 0.57–1.00)
Globulin, Total: 2.7 g/dL (ref 1.5–4.5)
Glucose: 93 mg/dL (ref 70–99)
Potassium: 4.5 mmol/L (ref 3.5–5.2)
Sodium: 139 mmol/L (ref 134–144)
Total Protein: 7.2 g/dL (ref 6.0–8.5)
eGFR: 60 mL/min/{1.73_m2} (ref >59)

## 2022-11-20 LAB — INSULIN, RANDOM: INSULIN: 19.1 u[IU]/mL (ref 2.6–24.9)

## 2022-11-20 LAB — VITAMIN B12: Vitamin B-12: 571 pg/mL (ref 232–1245)

## 2022-11-20 LAB — HEMOGLOBIN A1C
Est. average glucose Bld gHb Est-mCnc: 123 mg/dL
Hgb A1c MFr Bld: 5.9 % — ABNORMAL HIGH (ref 4.8–5.6)

## 2022-11-20 LAB — VITAMIN D 25 HYDROXY (VIT D DEFICIENCY, FRACTURES): Vit D, 25-Hydroxy: 37.4 ng/mL (ref 30.0–100.0)

## 2022-11-20 LAB — TSH: TSH: 4.18 u[IU]/mL (ref 0.450–4.500)

## 2022-11-24 ENCOUNTER — Other Ambulatory Visit (INDEPENDENT_AMBULATORY_CARE_PROVIDER_SITE_OTHER): Payer: Self-pay | Admitting: Family Medicine

## 2022-11-24 DIAGNOSIS — F3289 Other specified depressive episodes: Secondary | ICD-10-CM

## 2022-11-25 ENCOUNTER — Encounter (INDEPENDENT_AMBULATORY_CARE_PROVIDER_SITE_OTHER): Payer: Self-pay | Admitting: Family Medicine

## 2022-11-25 ENCOUNTER — Ambulatory Visit (INDEPENDENT_AMBULATORY_CARE_PROVIDER_SITE_OTHER): Payer: BC Managed Care – PPO | Admitting: Family Medicine

## 2022-11-25 VITALS — BP 109/72 | HR 67 | Temp 98.0°F | Ht 61.0 in | Wt 179.0 lb

## 2022-11-25 DIAGNOSIS — Z6833 Body mass index (BMI) 33.0-33.9, adult: Secondary | ICD-10-CM

## 2022-11-25 DIAGNOSIS — E038 Other specified hypothyroidism: Secondary | ICD-10-CM

## 2022-11-25 DIAGNOSIS — E7849 Other hyperlipidemia: Secondary | ICD-10-CM | POA: Diagnosis not present

## 2022-11-25 DIAGNOSIS — E669 Obesity, unspecified: Secondary | ICD-10-CM

## 2022-11-25 DIAGNOSIS — R7303 Prediabetes: Secondary | ICD-10-CM | POA: Diagnosis not present

## 2022-11-25 DIAGNOSIS — F3289 Other specified depressive episodes: Secondary | ICD-10-CM

## 2022-11-25 DIAGNOSIS — E559 Vitamin D deficiency, unspecified: Secondary | ICD-10-CM

## 2022-11-25 MED ORDER — LEVOTHYROXINE SODIUM 75 MCG PO TABS: 75.0000 ug | ORAL_TABLET | Freq: Every day | ORAL | 0 refills | Status: DC

## 2022-11-25 MED ORDER — ATORVASTATIN CALCIUM 10 MG PO TABS: 10.0000 mg | ORAL_TABLET | Freq: Every day | ORAL | 0 refills | Status: DC

## 2022-11-25 MED ORDER — VITAMIN D (ERGOCALCIFEROL) 1.25 MG (50000 UNIT) PO CAPS: ORAL_CAPSULE | ORAL | 0 refills | Status: DC

## 2022-11-25 MED ORDER — GEMFIBROZIL 600 MG PO TABS: 600.0000 mg | ORAL_TABLET | Freq: Two times a day (BID) | ORAL | 0 refills | Status: DC

## 2022-11-25 MED ORDER — METFORMIN HCL 500 MG PO TABS: 500.0000 mg | ORAL_TABLET | Freq: Every day | ORAL | 0 refills | Status: DC

## 2022-11-25 MED ORDER — BUPROPION HCL ER (SR) 150 MG PO TB12: 150.0000 mg | ORAL_TABLET | Freq: Two times a day (BID) | ORAL | 0 refills | Status: DC

## 2022-11-26 NOTE — Progress Notes (Unsigned)
Chief Complaint:   OBESITY Betty Porter is here to discuss her progress with her obesity treatment plan along with follow-up of her obesity related diagnoses. Betty Porter is on following a lower carbohydrate, vegetable and lean protein rich diet plan and states she is following her eating plan approximately 60% of the time. Betty Porter states she is doing crossfit for 60 minutes 3-4 times per week.  Today's visit was #: 25 Starting weight: 178 lbs Starting date: 11/06/2020 Today's weight: 179 lbs Today's date: 11/25/2022 Total lbs lost to date: 0 Total lbs lost since last in-office visit: 0  Interim History: Betty Porter went on a cruise and did some celebration eating since her last visit. She is disappointed that she gained weight, but she is ready to get back on track.   Subjective:   1. Prediabetes Betty Porter's A1c is worsening. She was started on metformin and she took it for 2 weeks with no side effects were noted. She stopped taking as she didn't feel it helped her polyphagia. I discussed labs with the patient today.   2. Vitamin D deficiency Betty Porter's Vitamin D level is slowly improving, but it is not yet at goal. I discussed labs with the patient today.   3. Other hyperlipidemia Betty Porter's HDL and triglycerides are at goal, but LDL is elevated. She is on medications but also had gained a bit of weight. I discussed labs with the patient today.   4. Other specified hypothyroidism Betty Porter's level is stable on Synthroid. No side effects were noted. No tremors or elevated blood pressure were noted. I discussed labs with the patient today.   5. Emotional Eating Behavior Betty Porter is working decreasing emotional eating behaviors. She has no problems with her Wellbutrin. Her blood pressure is not elevated.   Assessment/Plan:   1. Prediabetes Betty Porter agreed to increase metformin to 500 mg BID with no refills. We will check labs in 3 months. She will get back to her diet prescription strictly.   -  metFORMIN (GLUCOPHAGE) 500 MG tablet; Take 1 tablet (500 mg total) by mouth daily.  Dispense: 30 tablet; Refill: 0  2. Vitamin D deficiency We will refill prescription Vitamin D, and we will recheck labs in 3 months.   - Vitamin D, Ergocalciferol, (DRISDOL) 1.25 MG (50000 UNIT) CAPS capsule; TAKE ONE CAPSULE BY MOUTH ONCE WEEKLY (EVERY 7 DAYS)  Dispense: 4 capsule; Refill: 0  3. Other hyperlipidemia Payslie will continue her medications, and we will refill Lipitor and Lopid for 1 month. We will recheck labs in 3 months.   - atorvastatin (LIPITOR) 10 MG tablet; Take 1 tablet (10 mg total) by mouth at bedtime.  Dispense: 30 tablet; Refill: 0 - gemfibrozil (LOPID) 600 MG tablet; Take 1 tablet (600 mg total) by mouth in the morning and at bedtime.  Dispense: 60 tablet; Refill: 0  4. Other specified hypothyroidism Brunilda will continue Synthroid, and we will refill for 1 month.   - levothyroxine (SYNTHROID) 75 MCG tablet; Take 1 tablet (75 mcg total) by mouth daily before breakfast.  Dispense: 30 tablet; Refill: 0  5. Emotional Eating Behavior We will refill Wellbutrin SR for 1 month. She will work on decreasing emotional eating behaviors.   - buPROPion (WELLBUTRIN SR) 150 MG 12 hr tablet; Take 1 tablet (150 mg total) by mouth 2 (two) times daily.  Dispense: 60 tablet; Refill: 0  6. BMI 33.0-33.9,adult  7. Obesity, Beginning BMI 33.63 Betty Porter is currently in the action stage of change. As such, her goal is  to continue with weight loss efforts. She has agreed to change to the Category 2 Plan.   Exercise goals: As is.   Behavioral modification strategies: increasing lean protein intake.  Betty Porter has agreed to follow-up with our clinic in 3 weeks. She was informed of the importance of frequent follow-up visits to maximize her success with intensive lifestyle modifications for her multiple health conditions.   Objective:   Blood pressure 109/72, pulse 67, temperature 98 F (36.7 C),  height  (1.549 m), weight 179 lb (81.2 kg), SpO2 98 %. Body mass index is 33.82 kg/m.  Lab Results  Component Value Date   CREATININE 1.12 (H) 11/19/2022   BUN 16 11/19/2022   NA 139 11/19/2022   K 4.5 11/19/2022   CL 104 11/19/2022   CO2 21 11/19/2022   Lab Results  Component Value Date   ALT 17 11/19/2022   AST 20 11/19/2022   ALKPHOS 79 11/19/2022   BILITOT 0.3 11/19/2022   Lab Results  Component Value Date   HGBA1C 5.9 (H) 11/19/2022   HGBA1C 5.7 (H) 07/09/2022   HGBA1C 5.6 02/25/2022   HGBA1C 5.6 10/10/2021   HGBA1C 5.4 03/04/2021   Lab Results  Component Value Date   INSULIN 19.1 11/19/2022   INSULIN 12.9 07/09/2022   INSULIN 16.4 02/25/2022   INSULIN 13.3 10/10/2021   INSULIN 14.2 03/04/2021   Lab Results  Component Value Date   TSH 4.180 11/19/2022   Lab Results  Component Value Date   CHOL 205 (H) 11/19/2022   HDL 58 11/19/2022   LDLCALC 125 (H) 11/19/2022   TRIG 126 11/19/2022   Lab Results  Component Value Date   VD25OH 37.4 11/19/2022   VD25OH 30.1 07/09/2022   VD25OH 36.0 02/25/2022   Lab Results  Component Value Date   WBC 6.1 11/06/2020   HGB 13.0 11/06/2020   HCT 38.8 11/06/2020   MCV 87 11/06/2020   PLT 299 11/06/2020   No results found for: "IRON", "TIBC", "FERRITIN"  Attestation Statements:   Reviewed by clinician on day of visit: allergies, medications, problem list, medical history, surgical history, family history, social history, and previous encounter notes.   I, Burt Knack, am acting as transcriptionist for Quillian Quince, MD.  I have reviewed the above documentation for accuracy and completeness, and I agree with the above. -  Quillian Quince, MD

## 2022-12-22 ENCOUNTER — Other Ambulatory Visit (INDEPENDENT_AMBULATORY_CARE_PROVIDER_SITE_OTHER): Payer: Self-pay | Admitting: Family Medicine

## 2022-12-22 DIAGNOSIS — F3289 Other specified depressive episodes: Secondary | ICD-10-CM

## 2022-12-25 ENCOUNTER — Encounter (INDEPENDENT_AMBULATORY_CARE_PROVIDER_SITE_OTHER): Payer: Self-pay | Admitting: Family Medicine

## 2022-12-25 ENCOUNTER — Ambulatory Visit (INDEPENDENT_AMBULATORY_CARE_PROVIDER_SITE_OTHER): Payer: BC Managed Care – PPO | Admitting: Family Medicine

## 2022-12-25 VITALS — BP 117/76 | HR 75 | Temp 98.1°F | Ht 61.0 in | Wt 176.0 lb

## 2022-12-25 DIAGNOSIS — E669 Obesity, unspecified: Secondary | ICD-10-CM

## 2022-12-25 DIAGNOSIS — Z6833 Body mass index (BMI) 33.0-33.9, adult: Secondary | ICD-10-CM

## 2022-12-25 DIAGNOSIS — E038 Other specified hypothyroidism: Secondary | ICD-10-CM | POA: Diagnosis not present

## 2022-12-25 DIAGNOSIS — F3289 Other specified depressive episodes: Secondary | ICD-10-CM

## 2022-12-25 DIAGNOSIS — R4689 Other symptoms and signs involving appearance and behavior: Secondary | ICD-10-CM

## 2022-12-25 DIAGNOSIS — R7303 Prediabetes: Secondary | ICD-10-CM

## 2022-12-25 DIAGNOSIS — E7849 Other hyperlipidemia: Secondary | ICD-10-CM | POA: Diagnosis not present

## 2022-12-25 DIAGNOSIS — E559 Vitamin D deficiency, unspecified: Secondary | ICD-10-CM

## 2022-12-25 MED ORDER — GEMFIBROZIL 600 MG PO TABS: 600.0000 mg | ORAL_TABLET | Freq: Two times a day (BID) | ORAL | 0 refills | Status: DC

## 2022-12-25 MED ORDER — BUPROPION HCL ER (SR) 150 MG PO TB12
150.0000 mg | ORAL_TABLET | Freq: Two times a day (BID) | ORAL | 0 refills | Status: DC
Start: 2022-12-25 — End: 2023-01-15

## 2022-12-25 MED ORDER — VITAMIN D (ERGOCALCIFEROL) 1.25 MG (50000 UNIT) PO CAPS: ORAL_CAPSULE | ORAL | 0 refills | Status: DC

## 2022-12-25 MED ORDER — ATORVASTATIN CALCIUM 10 MG PO TABS: 10.0000 mg | ORAL_TABLET | Freq: Every day | ORAL | 0 refills | Status: DC

## 2022-12-25 MED ORDER — LEVOTHYROXINE SODIUM 75 MCG PO TABS: 75.0000 ug | ORAL_TABLET | Freq: Every day | ORAL | 0 refills | Status: DC

## 2022-12-25 MED ORDER — METFORMIN HCL 500 MG PO TABS
500.0000 mg | ORAL_TABLET | Freq: Two times a day (BID) | ORAL | 0 refills | Status: DC
Start: 2022-12-25 — End: 2023-01-15

## 2022-12-29 NOTE — Progress Notes (Unsigned)
Chief Complaint:   OBESITY Betty Porter is here to discuss her progress with her obesity treatment plan along with follow-up of her obesity related diagnoses. Betty Porter is on the Category 2 Plan and states she is following her eating plan approximately 65-70% of the time. Betty Porter states she is doing Crossfit for 60 minutes 3-4 times per week.  Today's visit was #: 26 Starting weight: 178 lbs Starting date: 11/06/2020 Today's weight: 176 lbs Today's date: 12/25/2022 Total lbs lost to date: 2 Total lbs lost since last in-office visit: 3  Interim History: Betty Porter has done better with her weight loss.  She is going in between category 2 and category 3, and her hunger is mostly controlled.  Subjective:   1. Prediabetes Betty Porter's A1c is creeping up, but she is doing better with her diet and exercise.  I discussed labs with the patient today.  2. Other hyperlipidemia Betty Porter is stable on her medications, but her labs are not yet at goal.  I discussed labs with the patient today.  3. Vitamin D deficiency Betty Porter's vitamin D level is at goal, and she denies nausea, vomiting, or muscle weakness.  I discussed labs with the patient today.  4. Other specified hypothyroidism Betty Porter is on levothyroxine, and her labs were reviewed today and were within normal limits.  She denies symptoms.  5. Emotional Eating Behavior Betty Porter is doing better with decreasing emotional eating behavior.  No side effects were noted with Wellbutrin.  Assessment/Plan:   1. Prediabetes Ashleyann agreed to increase metformin to 500 mg twice daily, and we will refill for 1 month.  - metFORMIN (GLUCOPHAGE) 500 MG tablet; Take 1 tablet (500 mg total) by mouth 2 (two) times daily with a meal.  Dispense: 60 tablet; Refill: 0  2. Other hyperlipidemia Yackelin will continue her medications, and we will refill Lipitor and gemfibrozil for 1 month.  - atorvastatin (LIPITOR) 10 MG tablet; Take 1 tablet (10 mg total) by mouth at  bedtime.  Dispense: 30 tablet; Refill: 0 - gemfibrozil (LOPID) 600 MG tablet; Take 1 tablet (600 mg total) by mouth in the morning and at bedtime.  Dispense: 60 tablet; Refill: 0  3. Vitamin D deficiency Caoilinn will continue prescription vitamin D, and we will refill for 1 month.  - Vitamin D, Ergocalciferol, (DRISDOL) 1.25 MG (50000 UNIT) CAPS capsule; TAKE ONE CAPSULE BY MOUTH ONCE WEEKLY (EVERY 7 DAYS)  Dispense: 4 capsule; Refill: 0  4. Other specified hypothyroidism Santina will continue levothyroxine at 75 mcg daily, and we will refill for 1 month.  - levothyroxine (SYNTHROID) 75 MCG tablet; Take 1 tablet (75 mcg total) by mouth daily before breakfast.  Dispense: 30 tablet; Refill: 0  5. Emotional Eating Behavior Kerrah will continue Wellbutrin SR, and we will refill for 1 month.  - buPROPion (WELLBUTRIN SR) 150 MG 12 hr tablet; Take 1 tablet (150 mg total) by mouth 2 (two) times daily.  Dispense: 60 tablet; Refill: 0  6. BMI 33.0-33.9,adult  7. Obesity, Beginning BMI 33.63 Betty Porter is currently in the action stage of change. As such, her goal is to continue with weight loss efforts. She has agreed to the Category 2 Plan.   Exercise goals: As is.   Behavioral modification strategies: increasing lean protein intake.  Betty Porter has agreed to follow-up with our clinic in 4 weeks. She was informed of the importance of frequent follow-up visits to maximize her success with intensive lifestyle modifications for her multiple health conditions.   Objective:  Blood pressure 117/76, pulse 75, temperature 98.1 F (36.7 C), height 5\' 1"  (1.549 m), weight 176 lb (79.8 kg), SpO2 98 %. Body mass index is 33.25 kg/m.  Lab Results  Component Value Date   CREATININE 1.12 (H) 11/19/2022   BUN 16 11/19/2022   NA 139 11/19/2022   K 4.5 11/19/2022   CL 104 11/19/2022   CO2 21 11/19/2022   Lab Results  Component Value Date   ALT 17 11/19/2022   AST 20 11/19/2022   ALKPHOS 79  11/19/2022   BILITOT 0.3 11/19/2022   Lab Results  Component Value Date   HGBA1C 5.9 (H) 11/19/2022   HGBA1C 5.7 (H) 07/09/2022   HGBA1C 5.6 02/25/2022   HGBA1C 5.6 10/10/2021   HGBA1C 5.4 03/04/2021   Lab Results  Component Value Date   INSULIN 19.1 11/19/2022   INSULIN 12.9 07/09/2022   INSULIN 16.4 02/25/2022   INSULIN 13.3 10/10/2021   INSULIN 14.2 03/04/2021   Lab Results  Component Value Date   TSH 4.180 11/19/2022   Lab Results  Component Value Date   CHOL 205 (H) 11/19/2022   HDL 58 11/19/2022   LDLCALC 125 (H) 11/19/2022   TRIG 126 11/19/2022   Lab Results  Component Value Date   VD25OH 37.4 11/19/2022   VD25OH 30.1 07/09/2022   VD25OH 36.0 02/25/2022   Lab Results  Component Value Date   WBC 6.1 11/06/2020   HGB 13.0 11/06/2020   HCT 38.8 11/06/2020   MCV 87 11/06/2020   PLT 299 11/06/2020   No results found for: "IRON", "TIBC", "FERRITIN"  Attestation Statements:   Reviewed by clinician on day of visit: allergies, medications, problem list, medical history, surgical history, family history, social history, and previous encounter notes.   I, Burt Knack, am acting as transcriptionist for Quillian Quince, MD.  I have reviewed the above documentation for accuracy and completeness, and I agree with the above. -  Quillian Quince, MD

## 2023-01-15 ENCOUNTER — Ambulatory Visit (INDEPENDENT_AMBULATORY_CARE_PROVIDER_SITE_OTHER): Payer: BC Managed Care – PPO | Admitting: Family Medicine

## 2023-01-15 ENCOUNTER — Encounter (INDEPENDENT_AMBULATORY_CARE_PROVIDER_SITE_OTHER): Payer: Self-pay | Admitting: Family Medicine

## 2023-01-15 VITALS — BP 111/73 | HR 82 | Temp 97.7°F | Ht 61.0 in | Wt 175.0 lb

## 2023-01-15 DIAGNOSIS — E669 Obesity, unspecified: Secondary | ICD-10-CM

## 2023-01-15 DIAGNOSIS — E7849 Other hyperlipidemia: Secondary | ICD-10-CM | POA: Diagnosis not present

## 2023-01-15 DIAGNOSIS — E038 Other specified hypothyroidism: Secondary | ICD-10-CM | POA: Diagnosis not present

## 2023-01-15 DIAGNOSIS — E559 Vitamin D deficiency, unspecified: Secondary | ICD-10-CM

## 2023-01-15 DIAGNOSIS — Z6833 Body mass index (BMI) 33.0-33.9, adult: Secondary | ICD-10-CM

## 2023-01-15 DIAGNOSIS — F3289 Other specified depressive episodes: Secondary | ICD-10-CM

## 2023-01-15 DIAGNOSIS — R7303 Prediabetes: Secondary | ICD-10-CM

## 2023-01-15 MED ORDER — VITAMIN D (ERGOCALCIFEROL) 1.25 MG (50000 UNIT) PO CAPS: ORAL_CAPSULE | ORAL | 0 refills | Status: DC

## 2023-01-15 MED ORDER — METFORMIN HCL 500 MG PO TABS
500.0000 mg | ORAL_TABLET | Freq: Two times a day (BID) | ORAL | 0 refills | Status: DC
Start: 2023-01-15 — End: 2023-04-01

## 2023-01-15 MED ORDER — ATORVASTATIN CALCIUM 10 MG PO TABS: 10.0000 mg | ORAL_TABLET | Freq: Every day | ORAL | 0 refills | Status: DC

## 2023-01-15 MED ORDER — LEVOTHYROXINE SODIUM 75 MCG PO TABS
75.0000 ug | ORAL_TABLET | Freq: Every day | ORAL | 0 refills | Status: DC
Start: 2023-01-15 — End: 2023-04-01

## 2023-01-15 MED ORDER — GEMFIBROZIL 600 MG PO TABS
600.0000 mg | ORAL_TABLET | Freq: Two times a day (BID) | ORAL | 0 refills | Status: DC
Start: 2023-01-15 — End: 2023-04-01

## 2023-01-15 MED ORDER — BUPROPION HCL ER (SR) 150 MG PO TB12: 150.0000 mg | ORAL_TABLET | Freq: Two times a day (BID) | ORAL | 0 refills | Status: DC

## 2023-01-17 ENCOUNTER — Other Ambulatory Visit (INDEPENDENT_AMBULATORY_CARE_PROVIDER_SITE_OTHER): Payer: Self-pay | Admitting: Family Medicine

## 2023-01-17 DIAGNOSIS — R7303 Prediabetes: Secondary | ICD-10-CM

## 2023-01-19 NOTE — Progress Notes (Signed)
Chief Complaint:   OBESITY Betty Porter is here to discuss her progress with her obesity treatment plan along with follow-up of her obesity related diagnoses. Betty Porter is on the Category 2 Plan and states she is following her eating plan approximately 65% of the time. Betty Porter states she is doing Crossfit for 60 minutes 3-4 times per week.  Today's visit was #: 27 Starting weight: 178 lbs Starting date: 11/06/2020 Today's weight: 175 lbs Today's date: 01/15/2023 Total lbs lost to date: 3 Total lbs lost since last in-office visit: 1  Interim History: Patient skips meals occasionally, and she notes her hunger is appropriate and is working on following her category 2-category 3 plan.  Subjective:   1. Other hyperlipidemia Patient is stable on her medications and with her diet.  She denies chest pain or myalgias.  2. Prediabetes Patient is doing well on metformin, but sometimes misses her dinner dose.  She denies nausea or vomiting.  3. Other specified hypothyroidism Patient's last TSH was at goal.  She denies tremors or palpitations.  4. Vitamin D deficiency Patient is on vitamin D and her level is at goal.  She denies nausea, vomiting, or muscle weakness.  5. Emotional Eating Behavior Patient symptoms have improved on Wellbutrin.  She is missing some p.m. doses at times.  She denies insomnia and her blood pressure is stable.  Assessment/Plan:   1. Other hyperlipidemia Patient will continue her medications, and we will refill Lipitor and Lopid for 1 month.  - gemfibrozil (LOPID) 600 MG tablet; Take 1 tablet (600 mg total) by mouth in the morning and at bedtime.  Dispense: 60 tablet; Refill: 0 - atorvastatin (LIPITOR) 10 MG tablet; Take 1 tablet (10 mg total) by mouth at bedtime.  Dispense: 30 tablet; Refill: 0  2. Prediabetes Patient will continue metformin, and we will refill for 1 month.  - metFORMIN (GLUCOPHAGE) 500 MG tablet; Take 1 tablet (500 mg total) by mouth 2 (two)  times daily with a meal.  Dispense: 60 tablet; Refill: 0  3. Other specified hypothyroidism Patient will continue Synthroid, and we will refill for 1 month.  - levothyroxine (SYNTHROID) 75 MCG tablet; Take 1 tablet (75 mcg total) by mouth daily before breakfast.  Dispense: 30 tablet; Refill: 0  4. Vitamin D deficiency Patient will continue prescription vitamin D, and we will refill for 1 month.  We will recheck labs in 1 to 2 months.  - Vitamin D, Ergocalciferol, (DRISDOL) 1.25 MG (50000 UNIT) CAPS capsule; TAKE ONE CAPSULE BY MOUTH ONCE WEEKLY (EVERY 7 DAYS)  Dispense: 4 capsule; Refill: 0  5. Emotional Eating Behavior Patient will continue Wellbutrin SR, and we will refill for 1 month.  - buPROPion (WELLBUTRIN SR) 150 MG 12 hr tablet; Take 1 tablet (150 mg total) by mouth 2 (two) times daily.  Dispense: 60 tablet; Refill: 0  6. BMI 33.0-33.9,adult  7. Obesity, Beginning BMI 33.63 Betty Porter is currently in the action stage of change. As such, her goal is to continue with weight loss efforts. She has agreed to the Category 2 Plan and the Category 3 Plan.   Exercise goals: As is.   Behavioral modification strategies: increasing lean protein intake.  Betty Porter has agreed to follow-up with our clinic in 4 weeks. She was informed of the importance of frequent follow-up visits to maximize her success with intensive lifestyle modifications for her multiple health conditions.   Objective:   Blood pressure 111/73, pulse 82, temperature 97.7 F (36.5 C), height 5'  1" (1.549 m), weight 175 lb (79.4 kg), SpO2 97 %. Body mass index is 33.07 kg/m.  Lab Results  Component Value Date   CREATININE 1.12 (H) 11/19/2022   BUN 16 11/19/2022   NA 139 11/19/2022   K 4.5 11/19/2022   CL 104 11/19/2022   CO2 21 11/19/2022   Lab Results  Component Value Date   ALT 17 11/19/2022   AST 20 11/19/2022   ALKPHOS 79 11/19/2022   BILITOT 0.3 11/19/2022   Lab Results  Component Value Date   HGBA1C  5.9 (H) 11/19/2022   HGBA1C 5.7 (H) 07/09/2022   HGBA1C 5.6 02/25/2022   HGBA1C 5.6 10/10/2021   HGBA1C 5.4 03/04/2021   Lab Results  Component Value Date   INSULIN 19.1 11/19/2022   INSULIN 12.9 07/09/2022   INSULIN 16.4 02/25/2022   INSULIN 13.3 10/10/2021   INSULIN 14.2 03/04/2021   Lab Results  Component Value Date   TSH 4.180 11/19/2022   Lab Results  Component Value Date   CHOL 205 (H) 11/19/2022   HDL 58 11/19/2022   LDLCALC 125 (H) 11/19/2022   TRIG 126 11/19/2022   Lab Results  Component Value Date   VD25OH 37.4 11/19/2022   VD25OH 30.1 07/09/2022   VD25OH 36.0 02/25/2022   Lab Results  Component Value Date   WBC 6.1 11/06/2020   HGB 13.0 11/06/2020   HCT 38.8 11/06/2020   MCV 87 11/06/2020   PLT 299 11/06/2020   No results found for: "IRON", "TIBC", "FERRITIN"  Attestation Statements:   Reviewed by clinician on day of visit: allergies, medications, problem list, medical history, surgical history, family history, social history, and previous encounter notes.   I, Burt Knack, am acting as transcriptionist for Quillian Quince, MD.  I have reviewed the above documentation for accuracy and completeness, and I agree with the above. -  Quillian Quince, MD

## 2023-02-17 ENCOUNTER — Ambulatory Visit (INDEPENDENT_AMBULATORY_CARE_PROVIDER_SITE_OTHER): Payer: BC Managed Care – PPO | Admitting: Family Medicine

## 2023-02-17 ENCOUNTER — Encounter (INDEPENDENT_AMBULATORY_CARE_PROVIDER_SITE_OTHER): Payer: Self-pay | Admitting: Family Medicine

## 2023-03-04 ENCOUNTER — Other Ambulatory Visit (INDEPENDENT_AMBULATORY_CARE_PROVIDER_SITE_OTHER): Payer: Self-pay | Admitting: Family Medicine

## 2023-03-04 DIAGNOSIS — E559 Vitamin D deficiency, unspecified: Secondary | ICD-10-CM

## 2023-03-06 ENCOUNTER — Other Ambulatory Visit (INDEPENDENT_AMBULATORY_CARE_PROVIDER_SITE_OTHER): Payer: Self-pay | Admitting: Family Medicine

## 2023-03-06 DIAGNOSIS — F3289 Other specified depressive episodes: Secondary | ICD-10-CM

## 2023-03-21 ENCOUNTER — Other Ambulatory Visit (INDEPENDENT_AMBULATORY_CARE_PROVIDER_SITE_OTHER): Payer: Self-pay | Admitting: Family Medicine

## 2023-03-21 DIAGNOSIS — E7849 Other hyperlipidemia: Secondary | ICD-10-CM

## 2023-03-21 DIAGNOSIS — R7303 Prediabetes: Secondary | ICD-10-CM

## 2023-03-21 DIAGNOSIS — E038 Other specified hypothyroidism: Secondary | ICD-10-CM

## 2023-04-01 ENCOUNTER — Ambulatory Visit (INDEPENDENT_AMBULATORY_CARE_PROVIDER_SITE_OTHER): Payer: BC Managed Care – PPO | Admitting: Family Medicine

## 2023-04-01 ENCOUNTER — Encounter (INDEPENDENT_AMBULATORY_CARE_PROVIDER_SITE_OTHER): Payer: Self-pay | Admitting: Family Medicine

## 2023-04-01 VITALS — BP 116/76 | HR 64 | Temp 97.8°F | Ht 61.0 in | Wt 176.0 lb

## 2023-04-01 DIAGNOSIS — N951 Menopausal and female climacteric states: Secondary | ICD-10-CM

## 2023-04-01 DIAGNOSIS — R7303 Prediabetes: Secondary | ICD-10-CM

## 2023-04-01 DIAGNOSIS — E7849 Other hyperlipidemia: Secondary | ICD-10-CM

## 2023-04-01 DIAGNOSIS — E669 Obesity, unspecified: Secondary | ICD-10-CM

## 2023-04-01 DIAGNOSIS — E038 Other specified hypothyroidism: Secondary | ICD-10-CM

## 2023-04-01 DIAGNOSIS — E559 Vitamin D deficiency, unspecified: Secondary | ICD-10-CM

## 2023-04-01 DIAGNOSIS — F3289 Other specified depressive episodes: Secondary | ICD-10-CM

## 2023-04-01 DIAGNOSIS — Z6833 Body mass index (BMI) 33.0-33.9, adult: Secondary | ICD-10-CM

## 2023-04-01 MED ORDER — ATORVASTATIN CALCIUM 10 MG PO TABS
10.0000 mg | ORAL_TABLET | Freq: Every day | ORAL | 0 refills | Status: DC
Start: 2023-04-01 — End: 2023-06-01

## 2023-04-01 MED ORDER — VITAMIN D (ERGOCALCIFEROL) 1.25 MG (50000 UNIT) PO CAPS
ORAL_CAPSULE | ORAL | 0 refills | Status: DC
Start: 2023-04-01 — End: 2023-06-01

## 2023-04-01 MED ORDER — METFORMIN HCL 500 MG PO TABS: 500.0000 mg | ORAL_TABLET | Freq: Two times a day (BID) | ORAL | 0 refills | Status: DC

## 2023-04-01 MED ORDER — GEMFIBROZIL 600 MG PO TABS
600.0000 mg | ORAL_TABLET | Freq: Two times a day (BID) | ORAL | 0 refills | Status: DC
Start: 2023-04-01 — End: 2023-06-01

## 2023-04-01 MED ORDER — BUPROPION HCL ER (SR) 150 MG PO TB12
150.0000 mg | ORAL_TABLET | Freq: Two times a day (BID) | ORAL | 0 refills | Status: DC
Start: 2023-04-01 — End: 2023-06-01

## 2023-04-01 MED ORDER — LEVOTHYROXINE SODIUM 75 MCG PO TABS
75.0000 ug | ORAL_TABLET | Freq: Every day | ORAL | 0 refills | Status: DC
Start: 2023-04-01 — End: 2023-06-01

## 2023-04-02 NOTE — Progress Notes (Signed)
Chief Complaint:   OBESITY Betty Porter is here to discuss her progress with her obesity treatment plan along with follow-up of her obesity related diagnoses. Betty Porter is on the Category 2 Plan and states she is following her eating plan approximately 10% of the time. Betty Porter states she is doing 0 minutes 0 times per week.  Today's visit was #: 28 Starting weight: 178 lbs Starting date: 11/06/2020 Today's weight: 176 lbs Today's date: 04/01/2023 Total lbs lost to date: 2 Total lbs lost since last in-office visit: 0  Interim History: Patient is struggling to follow her eating plan.  She struggles with missing meals and meal planning.  She feels her cravings are not as big of a problem.  She did more traveling, but she still tried to meet her protein goals.  She suspects that she is in perimenopause.  Subjective:   1. Perimenopause Patient notes her cycles are becoming more irregular and she has questions about menopause and weight gain.  2. Prediabetes Patient is working on her diet and she is doing well with metformin with no side effects noted.  3. Vitamin D deficiency Patient is stable on vitamin D, and no side effects were noted.  4. Other hyperlipidemia Patient is tolerating her medications well.  She is exercising and decreasing cholesterol in her diet.  5. Other specified hypothyroidism Patient's recent TSH was within normal limits.  No side effects were noted on her medications.  6. Emotional Eating Behavior Patient is stable on Wellbutrin.  She is mindful of her cravings and she is working on decreasing emotional eating behavior.  Assessment/Plan:   1. Perimenopause All of the patient's questions were answered.  She will continue to work on building strength to avoid menopausal weight gain.  We will continue to follow.  2. Prediabetes Patient will continue metformin 500 mg twice daily, and we will refill for 90 days.  - metFORMIN (GLUCOPHAGE) 500 MG tablet; Take 1  tablet (500 mg total) by mouth 2 (two) times daily with a meal.  Dispense: 180 tablet; Refill: 0  3. Vitamin D deficiency Patient will continue prescription vitamin D 50,000 IU once weekly, and we will refill for 90 days.  - Vitamin D, Ergocalciferol, (DRISDOL) 1.25 MG (50000 UNIT) CAPS capsule; TAKE ONE CAPSULE BY MOUTH ONCE WEEKLY (EVERY 7 DAYS)  Dispense: 13 capsule; Refill: 0  4. Other hyperlipidemia Patient will continue her medications, and we will refill Lipitor and Lopid for 90 days.  - gemfibrozil (LOPID) 600 MG tablet; Take 1 tablet (600 mg total) by mouth in the morning and at bedtime.  Dispense: 90 tablet; Refill: 0 - atorvastatin (LIPITOR) 10 MG tablet; Take 1 tablet (10 mg total) by mouth at bedtime.  Dispense: 90 tablet; Refill: 0  5. Other specified hypothyroidism Patient will continue levothyroxine 75 mcg once daily, and we will refill for 90 days.  - levothyroxine (SYNTHROID) 75 MCG tablet; Take 1 tablet (75 mcg total) by mouth daily before breakfast.  Dispense: 90 tablet; Refill: 0  6. Emotional Eating Behavior Patient will continue Wellbutrin SR 150 mg twice daily, and we will refill for 90 days.  - buPROPion (WELLBUTRIN SR) 150 MG 12 hr tablet; Take 1 tablet (150 mg total) by mouth 2 (two) times daily.  Dispense: 180 tablet; Refill: 0  7. BMI 33.0-33.9,adult  8. Obesity, Beginning BMI 33.63 Betty Porter is currently in the action stage of change. As such, her goal is to continue with weight loss efforts. She has agreed to  the Category 2 Plan.   Exercise goals: As is.   Behavioral modification strategies: increasing lean protein intake.  Betty Porter has agreed to follow-up with our clinic in 4 weeks. She was informed of the importance of frequent follow-up visits to maximize her success with intensive lifestyle modifications for her multiple health conditions.   Objective:   Blood pressure 116/76, pulse 64, temperature 97.8 F (36.6 C), height 5\' 1"  (1.549 m), weight  176 lb (79.8 kg), SpO2 96%. Body mass index is 33.25 kg/m.  Lab Results  Component Value Date   CREATININE 1.12 (H) 11/19/2022   BUN 16 11/19/2022   NA 139 11/19/2022   K 4.5 11/19/2022   CL 104 11/19/2022   CO2 21 11/19/2022   Lab Results  Component Value Date   ALT 17 11/19/2022   AST 20 11/19/2022   ALKPHOS 79 11/19/2022   BILITOT 0.3 11/19/2022   Lab Results  Component Value Date   HGBA1C 5.9 (H) 11/19/2022   HGBA1C 5.7 (H) 07/09/2022   HGBA1C 5.6 02/25/2022   HGBA1C 5.6 10/10/2021   HGBA1C 5.4 03/04/2021   Lab Results  Component Value Date   INSULIN 19.1 11/19/2022   INSULIN 12.9 07/09/2022   INSULIN 16.4 02/25/2022   INSULIN 13.3 10/10/2021   INSULIN 14.2 03/04/2021   Lab Results  Component Value Date   TSH 4.180 11/19/2022   Lab Results  Component Value Date   CHOL 205 (H) 11/19/2022   HDL 58 11/19/2022   LDLCALC 125 (H) 11/19/2022   TRIG 126 11/19/2022   Lab Results  Component Value Date   VD25OH 37.4 11/19/2022   VD25OH 30.1 07/09/2022   VD25OH 36.0 02/25/2022   Lab Results  Component Value Date   WBC 6.1 11/06/2020   HGB 13.0 11/06/2020   HCT 38.8 11/06/2020   MCV 87 11/06/2020   PLT 299 11/06/2020   No results found for: "IRON", "TIBC", "FERRITIN"  Attestation Statements:   Reviewed by clinician on day of visit: allergies, medications, problem list, medical history, surgical history, family history, social history, and previous encounter notes.   I, Burt Knack, am acting as transcriptionist for Quillian Quince, MD.  I have reviewed the above documentation for accuracy and completeness, and I agree with the above. -  Quillian Quince, MD

## 2023-04-15 ENCOUNTER — Other Ambulatory Visit: Payer: Self-pay | Admitting: Family Medicine

## 2023-04-15 DIAGNOSIS — Z1231 Encounter for screening mammogram for malignant neoplasm of breast: Secondary | ICD-10-CM

## 2023-04-30 ENCOUNTER — Ambulatory Visit (INDEPENDENT_AMBULATORY_CARE_PROVIDER_SITE_OTHER): Payer: BC Managed Care – PPO | Admitting: Family Medicine

## 2023-04-30 ENCOUNTER — Encounter (INDEPENDENT_AMBULATORY_CARE_PROVIDER_SITE_OTHER): Payer: Self-pay | Admitting: Family Medicine

## 2023-04-30 VITALS — BP 114/77 | HR 76 | Temp 98.0°F | Ht 61.0 in | Wt 176.0 lb

## 2023-04-30 DIAGNOSIS — E559 Vitamin D deficiency, unspecified: Secondary | ICD-10-CM | POA: Diagnosis not present

## 2023-04-30 DIAGNOSIS — E785 Hyperlipidemia, unspecified: Secondary | ICD-10-CM

## 2023-04-30 DIAGNOSIS — N959 Unspecified menopausal and perimenopausal disorder: Secondary | ICD-10-CM

## 2023-04-30 DIAGNOSIS — E782 Mixed hyperlipidemia: Secondary | ICD-10-CM

## 2023-04-30 DIAGNOSIS — R7303 Prediabetes: Secondary | ICD-10-CM | POA: Diagnosis not present

## 2023-04-30 DIAGNOSIS — E669 Obesity, unspecified: Secondary | ICD-10-CM

## 2023-04-30 DIAGNOSIS — N951 Menopausal and female climacteric states: Secondary | ICD-10-CM

## 2023-04-30 DIAGNOSIS — F3289 Other specified depressive episodes: Secondary | ICD-10-CM

## 2023-04-30 DIAGNOSIS — Z6833 Body mass index (BMI) 33.0-33.9, adult: Secondary | ICD-10-CM

## 2023-04-30 NOTE — Progress Notes (Signed)
.smr  Office: [phone]  /  Fax: [phone]  WEIGHT SUMMARY AND BIOMETRICS  Anthropometric Measurements Height: 5\' 1"  (1.549 m) Weight: 176 lb (79.8 kg) BMI (Calculated): 33.27 Weight at Last Visit: 176 lb Weight Lost Since Last Visit: 0 Weight Gained Since Last Visit: 0 Starting Weight: 178 lb Total Weight Loss (lbs): 2 lb (0.907 kg)   Body Composition  Body Fat %: 37.2 % Fat Mass (lbs): 65.6 lbs Muscle Mass (lbs): 105.2 lbs Total Body Water (lbs): 76.4 lbs Visceral Fat Rating : 9   Other Clinical Data Fasting: No Labs: No Today's Visit #: 49 Starting Date: 11/06/20    Chief Complaint: OBESITY   History of Present Illness   The patient, with a history of obesity, prediabetes, and hyperlipidemia, presents for a follow-up visit. She has been on metformin 500 mg twice daily for prediabetes, Lipitor 10 mg at bedtime for hyperlipidemia, and vitamin D 50,000 international units once weekly for vitamin D deficiency. She has maintained her weight since the last visit a month ago and report adherence to the category 3 eating plan about 75% of the time. She engages in CrossFit for exercise three times a week for 60 minutes each session.  The patient's last vitamin D level in April was 37.4, which is below the goal but improved from 22.4 the previous year. Her hemoglobin A1c, also measured in April, was 5.9, with a goal of 5.5 or below.  The patient expresses disappointment in her weight loss progress, feeling that she should have lost more weight. She suspects that she may be experiencing fluid retention related to menopausal changes, which have been ongoing for about three weeks. She describes breast heaviness and discomfort, which she has experienced throughout her life but has not subsided as expected after her recent menstrual period. She also reports a heavy and prolonged menstrual period.  The patient acknowledges some dietary lapses, including an episode of ice cream  overconsumption and a week of dietary disruption due to her mother's visit and sweet tooth. Despite these challenges, she believes she can control her eating most of the time.  The patient is also on bupropion 150 mg twice daily for emotional eating behaviors, but she admits difficulty remembering the second dose. She has not reported any problems with her current medications.  The patient expresses concern about increasing body stiffness and is considering incorporating yoga or Pilates into her exercise routine. She has been attending CrossFit sessions close to her home and finds the proximity motivating.          PHYSICAL EXAM:  Blood pressure 114/77, pulse 76, temperature 98 F (36.7 C), height 5\' 1"  (1.549 m), weight 176 lb (79.8 kg), SpO2 96%. Body mass index is 33.25 kg/m.  DIAGNOSTIC DATA REVIEWED:  BMET    Component Value Date/Time   NA 139 11/19/2022 0758   K 4.5 11/19/2022 0758   CL 104 11/19/2022 0758   CO2 21 11/19/2022 0758   GLUCOSE 93 11/19/2022 0758   GLUCOSE 96 09/10/2020 0917   BUN 16 11/19/2022 0758   CREATININE 1.12 (H) 11/19/2022 0758   CALCIUM 9.2 11/19/2022 0758   GFRNONAA >60 09/10/2020 0917   Lab Results  Component Value Date   HGBA1C 5.9 (H) 11/19/2022   HGBA1C 5.7 (H) 11/06/2020   Lab Results  Component Value Date   INSULIN 19.1 11/19/2022   INSULIN 11.9 11/06/2020   Lab Results  Component Value Date   TSH 4.180 11/19/2022   CBC    Component  Value Date/Time   WBC 6.1 11/06/2020 1216   WBC 5.7 09/10/2020 0917   RBC 4.46 11/06/2020 1216   RBC 4.50 09/10/2020 0917   HGB 13.0 11/06/2020 1216   HCT 38.8 11/06/2020 1216   PLT 299 11/06/2020 1216   MCV 87 11/06/2020 1216   MCH 29.1 11/06/2020 1216   MCH 29.3 09/10/2020 0917   MCHC 33.5 11/06/2020 1216   MCHC 33.1 09/10/2020 0917   RDW 13.1 11/06/2020 1216   Iron Studies No results found for: "IRON", "TIBC", "FERRITIN", "IRONPCTSAT" Lipid Panel     Component Value Date/Time   CHOL  205 (H) 11/19/2022 0758   TRIG 126 11/19/2022 0758   HDL 58 11/19/2022 0758   LDLCALC 125 (H) 11/19/2022 0758   Hepatic Function Panel     Component Value Date/Time   PROT 7.2 11/19/2022 0758   ALBUMIN 4.5 11/19/2022 0758   AST 20 11/19/2022 0758   ALT 17 11/19/2022 0758   ALKPHOS 79 11/19/2022 0758   BILITOT 0.3 11/19/2022 0758      Component Value Date/Time   TSH 4.180 11/19/2022 0758   Nutritional Lab Results  Component Value Date   VD25OH 37.4 11/19/2022   VD25OH 30.1 07/09/2022   VD25OH 36.0 02/25/2022     Assessment and Plan    Obesity Maintained weight since last visit. Adherence to category 3 eating plan approximately 75% of the time. Engaging in CrossFit for 60 minutes three times per week. Discussed meal planning strategies using the ChatGPT app. -Continue current exercise and dietary regimen. -Consider incorporating more stretching or yoga into routine for stiffness.  Prediabetes Last HbA1c in April was 5.9, goal is 5.5 or below. Currently on Metformin 500mg  twice daily. -Continue Metformin 500mg  twice daily. -Check HbA1c at next visit. -continue diet, exercise and weight loss to prevent the onset of full-blown diabetes.  Hyperlipidemia Currently on Lipitor 10mg  at bedtime. -Continue Lipitor 10mg  at bedtime. -continue diet, exercise and weight loss to treat her HLD.  Vitamin D Deficiency Last Vitamin D level in April was 37.4, improved from 22.4 last year but still below goal. Currently on prescription Vitamin D 50,000 international units once weekly. -Continue Vitamin D 50,000 international units once weekly. -Check Vitamin D level at next visit.  Perimenopause Reports symptoms consistent with perimenopause including breast tenderness and irregular menstrual cycles. Discussed potential options for symptom management including hormonal birth control. -Consider consultation with primary care doctor or gynecologist for symptom management.   Depression  with Emotional Eating Behaviors -Continue to work on taking Wellbutrin SR 150 mg BID to help both with EEB and with perimenopausal symptoms. Follow-up  Schedule next two appointments 4 weeks apart for continuity of care.       She was informed of the importance of frequent follow up visits to maximize her success with intensive lifestyle modifications for her multiple health conditions.    Quillian Quince, MD

## 2023-05-02 ENCOUNTER — Other Ambulatory Visit (INDEPENDENT_AMBULATORY_CARE_PROVIDER_SITE_OTHER): Payer: Self-pay | Admitting: Family Medicine

## 2023-05-02 DIAGNOSIS — E7849 Other hyperlipidemia: Secondary | ICD-10-CM

## 2023-06-01 ENCOUNTER — Ambulatory Visit (INDEPENDENT_AMBULATORY_CARE_PROVIDER_SITE_OTHER): Payer: BC Managed Care – PPO | Admitting: Family Medicine

## 2023-06-01 ENCOUNTER — Encounter (INDEPENDENT_AMBULATORY_CARE_PROVIDER_SITE_OTHER): Payer: Self-pay | Admitting: Family Medicine

## 2023-06-01 VITALS — BP 113/72 | HR 74 | Temp 98.0°F | Ht 61.0 in | Wt 172.0 lb

## 2023-06-01 DIAGNOSIS — E038 Other specified hypothyroidism: Secondary | ICD-10-CM

## 2023-06-01 DIAGNOSIS — R7303 Prediabetes: Secondary | ICD-10-CM | POA: Diagnosis not present

## 2023-06-01 DIAGNOSIS — E7849 Other hyperlipidemia: Secondary | ICD-10-CM

## 2023-06-01 DIAGNOSIS — E669 Obesity, unspecified: Secondary | ICD-10-CM

## 2023-06-01 DIAGNOSIS — E559 Vitamin D deficiency, unspecified: Secondary | ICD-10-CM | POA: Diagnosis not present

## 2023-06-01 DIAGNOSIS — E785 Hyperlipidemia, unspecified: Secondary | ICD-10-CM

## 2023-06-01 DIAGNOSIS — Z6832 Body mass index (BMI) 32.0-32.9, adult: Secondary | ICD-10-CM

## 2023-06-01 DIAGNOSIS — F3289 Other specified depressive episodes: Secondary | ICD-10-CM

## 2023-06-01 DIAGNOSIS — F5089 Other specified eating disorder: Secondary | ICD-10-CM

## 2023-06-01 MED ORDER — METFORMIN HCL 500 MG PO TABS
500.0000 mg | ORAL_TABLET | Freq: Two times a day (BID) | ORAL | 0 refills | Status: DC
Start: 2023-06-01 — End: 2023-09-03

## 2023-06-01 MED ORDER — VITAMIN D (ERGOCALCIFEROL) 1.25 MG (50000 UNIT) PO CAPS
ORAL_CAPSULE | ORAL | 0 refills | Status: DC
Start: 2023-06-01 — End: 2023-09-03

## 2023-06-01 MED ORDER — LEVOTHYROXINE SODIUM 75 MCG PO TABS
75.0000 ug | ORAL_TABLET | Freq: Every day | ORAL | 0 refills | Status: DC
Start: 2023-06-01 — End: 2023-09-03

## 2023-06-01 MED ORDER — GEMFIBROZIL 600 MG PO TABS
600.0000 mg | ORAL_TABLET | Freq: Two times a day (BID) | ORAL | 0 refills | Status: DC
Start: 2023-06-01 — End: 2023-09-03

## 2023-06-01 MED ORDER — BUPROPION HCL ER (SR) 150 MG PO TB12: 150.0000 mg | ORAL_TABLET | Freq: Two times a day (BID) | ORAL | 0 refills | Status: DC

## 2023-06-01 MED ORDER — ATORVASTATIN CALCIUM 10 MG PO TABS: 10.0000 mg | ORAL_TABLET | Freq: Every day | ORAL | 0 refills | Status: DC

## 2023-06-01 NOTE — Progress Notes (Signed)
.smr  Office: [phone]  /  Fax: [phone]  WEIGHT SUMMARY AND BIOMETRICS  Anthropometric Measurements Height: 5\' 1"  (1.549 m) Weight: 172 lb (78 kg) BMI (Calculated): 32.52 Weight at Last Visit: 176 lb Weight Lost Since Last Visit: 4 lb Weight Gained Since Last Visit: 0 Starting Weight: 178 lb Total Weight Loss (lbs): 6 lb (2.722 kg)   Body Composition  Body Fat %: 35.7 % Fat Mass (lbs): 61.6 lbs Muscle Mass (lbs): 105.4 lbs Total Body Water (lbs): 74.2 lbs Visceral Fat Rating : 9   Other Clinical Data Fasting: No Labs: No Today's Visit #: 30 Starting Date: 11/06/20    Chief Complaint: OBESITY   History of Present Illness   The patient, with a history of obesity, vitamin D deficiency, prediabetes, hypothyroidism, and hyperlipidemia, has been following a category three eating plan approximately 70% of the time and has lost four pounds in the last month. She has been participating in CrossFit for exercise two to three times per week. She is currently on vitamin D, metformin, Synthroid, gemfibrozil, Lipitor, and bupropion. The patient reports a controlled hunger level and occasional cravings, which she manages by saving calories during the day for what she really wants. She has noticed an improvement in her emotional eating behaviors since taking bupropion twice a day. She has no new health issues to report.          PHYSICAL EXAM:  Blood pressure 113/72, pulse 74, temperature 98 F (36.7 C), height 5\' 1"  (1.549 m), weight 172 lb (78 kg), SpO2 99%. Body mass index is 32.5 kg/m.  DIAGNOSTIC DATA REVIEWED:  BMET    Component Value Date/Time   NA 139 11/19/2022 0758   K 4.5 11/19/2022 0758   CL 104 11/19/2022 0758   CO2 21 11/19/2022 0758   GLUCOSE 93 11/19/2022 0758   GLUCOSE 96 09/10/2020 0917   BUN 16 11/19/2022 0758   CREATININE 1.12 (H) 11/19/2022 0758   CALCIUM 9.2 11/19/2022 0758   GFRNONAA >60 09/10/2020 0917   Lab Results  Component Value  Date   HGBA1C 5.9 (H) 11/19/2022   HGBA1C 5.7 (H) 11/06/2020   Lab Results  Component Value Date   INSULIN 19.1 11/19/2022   INSULIN 11.9 11/06/2020   Lab Results  Component Value Date   TSH 4.180 11/19/2022   CBC    Component Value Date/Time   WBC 6.1 11/06/2020 1216   WBC 5.7 09/10/2020 0917   RBC 4.46 11/06/2020 1216   RBC 4.50 09/10/2020 0917   HGB 13.0 11/06/2020 1216   HCT 38.8 11/06/2020 1216   PLT 299 11/06/2020 1216   MCV 87 11/06/2020 1216   MCH 29.1 11/06/2020 1216   MCH 29.3 09/10/2020 0917   MCHC 33.5 11/06/2020 1216   MCHC 33.1 09/10/2020 0917   RDW 13.1 11/06/2020 1216   Iron Studies No results found for: "IRON", "TIBC", "FERRITIN", "IRONPCTSAT" Lipid Panel     Component Value Date/Time   CHOL 205 (H) 11/19/2022 0758   TRIG 126 11/19/2022 0758   HDL 58 11/19/2022 0758   LDLCALC 125 (H) 11/19/2022 0758   Hepatic Function Panel     Component Value Date/Time   PROT 7.2 11/19/2022 0758   ALBUMIN 4.5 11/19/2022 0758   AST 20 11/19/2022 0758   ALT 17 11/19/2022 0758   ALKPHOS 79 11/19/2022 0758   BILITOT 0.3 11/19/2022 0758      Component Value Date/Time   TSH 4.180 11/19/2022 0758   Nutritional Lab Results  Component  Value Date   VD25OH 37.4 11/19/2022   VD25OH 30.1 07/09/2022   VD25OH 36.0 02/25/2022     Assessment and Plan    Obesity and Emotional Eating Behaviors Continued weight loss with adherence to category three eating plan and regular CrossFit exercise. Emotional eating behaviors managed with bupropion. -Continue current diet and exercise regimen. -Refill bupropion.  Vitamin D deficiency Last level not at goal despite supplementation, due to have labs rechecked. -Refill Vitamin D. -Order labs to check current Vitamin D level.  Prediabetes Last hemoglobin A1c was 5.8 on metformin and diet/weight loss. -Refill metformin. -Order labs to check current hemoglobin A1c.  Hypothyroidism Managed with Synthroid, no symptoms of  over or under replacement. -Refill Synthroid. -Order labs to check thyroid function.  Hyperlipidemia Last triglyceride level well controlled at 126 on gemfibrozil and Lipitor and especially with diet and weight loss overall. -Refill gemfibrozil and Lipitor. -Order labs to check lipid panel. -Consider discontinuing gemfibrozil if triglycerides remain well controlled.  General Health Maintenance -Order comprehensive labs. -Next appointment scheduled for 06/29/2023. -Schedule December appointment at front desk.       She was informed of the importance of frequent follow up visits to maximize her success with intensive lifestyle modifications for her multiple health conditions.    Quillian Quince, MD

## 2023-06-22 ENCOUNTER — Ambulatory Visit (HOSPITAL_BASED_OUTPATIENT_CLINIC_OR_DEPARTMENT_OTHER): Payer: BC Managed Care – PPO | Admitting: Family Medicine

## 2023-06-22 ENCOUNTER — Encounter (HOSPITAL_BASED_OUTPATIENT_CLINIC_OR_DEPARTMENT_OTHER): Payer: Self-pay | Admitting: *Deleted

## 2023-06-22 ENCOUNTER — Ambulatory Visit (HOSPITAL_BASED_OUTPATIENT_CLINIC_OR_DEPARTMENT_OTHER): Payer: BC Managed Care – PPO

## 2023-06-22 ENCOUNTER — Encounter (HOSPITAL_BASED_OUTPATIENT_CLINIC_OR_DEPARTMENT_OTHER): Payer: Self-pay | Admitting: Family Medicine

## 2023-06-22 VITALS — BP 122/82 | HR 90 | Ht 61.0 in | Wt 177.5 lb

## 2023-06-22 DIAGNOSIS — G8929 Other chronic pain: Secondary | ICD-10-CM

## 2023-06-22 DIAGNOSIS — Z7689 Persons encountering health services in other specified circumstances: Secondary | ICD-10-CM | POA: Diagnosis not present

## 2023-06-22 DIAGNOSIS — M25551 Pain in right hip: Secondary | ICD-10-CM

## 2023-06-22 DIAGNOSIS — Z1211 Encounter for screening for malignant neoplasm of colon: Secondary | ICD-10-CM

## 2023-06-22 MED ORDER — PREDNISONE 10 MG (21) PO TBPK: ORAL_TABLET | ORAL | 0 refills | Status: DC

## 2023-06-22 NOTE — Progress Notes (Signed)
New Patient Office Visit  Subjective:   Betty Porter 10-Jan-1971 06/22/2023  Chief Complaint  Patient presents with   New Patient (Initial Visit)    Patient is here today to get established with the practice. Has been having pain in her right hip and also has been having pain in both shins.    HPI: Betty Porter presents today to establish care at [facility] at [facility]. Introduced to Publishing rights manager role and practice setting.  All questions answered.   Last PCP: Dr. Dalbert Garnet with [facility] Last annual physical: N/a  Concerns: See below   RIGHT HIP PAIN Duration:  Several years. She states she had left hip pain several years ago and was seen by Orthopedic and had spontaneous relief with 2 intracortical injections. Denies known fall, injury. Patient does do Crossfit frequently and lifts weights frequently.  Involved hip: right  Location:  Anterior and radiating to right thigh Onset: gradual  Severity: 9/10  Quality: Burning, Sharp Frequency: intermittent Radiation:  Yes, to anterior thight.  Aggravating factors: Lying on right side, sitting for prolonged periods. Walking up steps   Alleviating factors:  Heat, NSAIDs, and rest  Status: fluctuating   Relief with NSAIDs?: moderate Weakness with weight bearing: no Weakness with walking: no Paresthesias / decreased sensation: no Swelling: no Redness:no Fevers: no  She was seeing chiropractic monthly but has not seen in 2 months. She has noticed increased pain in the last 1-2 weeks to bilateral shins as well. Denies known injury, fall or trauma. Has not been using OTC or measures for relief. Pain to shins increases with weight bearing.    The following portions of the patient's history were reviewed and updated as appropriate: past medical history, past surgical history, family history, social history, allergies, medications, and problem list.    Patient Active Problem List   Diagnosis Date Noted   Chronic right hip pain 06/22/2023   Perimenopause 04/01/2023   BMI 33.0-33.9,adult 09/18/2022   Insulin resistance 06/18/2022   Generalized obesity 04/22/2022   Hypothyroidism 03/25/2022   Other hyperlipidemia 03/25/2022   Depression 03/25/2022   Mixed hyperlipidemia 02/25/2022   Acquired hypothyroidism 02/25/2022   Pre-diabetes 02/25/2022   Vitamin D deficiency 12/18/2020   At risk for deficient intake of food 12/18/2020   Elevated CA-125    Cyst of left ovary 08/31/2020   TMJ pain dysfunction syndrome 03/31/2017   Past Medical History:  Diagnosis Date   Acne    Adnexal mass    complex   Allergy 2010   Seasonal   Fibroid, uterine 2008   GERD (gastroesophageal reflux disease)    Hand swelling    Hyperlipidemia    Hypothyroidism    followed by pcp   Joint pain    Lactose intolerance    Lower extremity edema    PONV (postoperative nausea and vomiting)    SUI (stress urinary incontinence, female)    Past Surgical History:  Procedure Laterality Date   ABDOMINAL HYSTERECTOMY  2022   CESAREAN SECTION  2009   LAPAROSCOPY  2008   for infertility   TONSILLECTOMY  1977   XI ROBOTIC ASSISTED OOPHORECTOMY N/A 09/12/2020   Procedure: XI ROBOTIC ASSISTED BILATERAL SALPINGECTOMY AND LEFT OOPHORECTOMY;  Surgeon: Carver Fila, MD;  Location: [facility];  Service: Gynecology;  Laterality: N/A;   Family History  Problem Relation Age of Onset   Breast cancer Maternal Aunt    Cancer  Maternal Aunt    Hypertension Mother    Osteoporosis Mother    Arthritis Mother    Diabetes Father    Hyperlipidemia Father    Obesity Father    Thyroid cancer Maternal Aunt    Leukemia Maternal Uncle    Diabetes Paternal Grandfather    Cancer Maternal Uncle    Colon cancer Neg Hx    Ovarian cancer Neg Hx    Endometrial cancer Neg Hx    Pancreatic cancer Neg Hx    Prostate cancer Neg Hx    Social History    Socioeconomic History   Marital status: Widowed    Spouse name: Not on file   Number of children: Not on file   Years of education: Not on file   Highest education level: Associate degree: occupational, Scientist, product/process development, or vocational program  Occupational History   Occupation: Chiropractor  Tobacco Use   Smoking status: Former    Current packs/day: 0.00    Average packs/day: 0.8 packs/day for 10.0 years (7.5 ttl pk-yrs)    Types: Cigarettes    Start date: 08/11/1986    Quit date: 08/11/1996    Years since quitting: 26.8   Smokeless tobacco: Never  Vaping Use   Vaping status: Never Used  Substance and Sexual Activity   Alcohol use: Not Currently    Comment: socially    Drug use: Never   Sexual activity: Not Currently    Birth control/protection: None  Other Topics Concern   Not on file  Social History Narrative   Not on file   Social Determinants of Health   Financial Resource Strain: Low Risk  (06/18/2023)   Overall Financial Resource Strain (CARDIA)    Difficulty of Paying Living Expenses: Not hard at all  Food Insecurity: No Food Insecurity (06/18/2023)   Hunger Vital Sign    Worried About Running Out of Food in the Last Year: Never true    Ran Out of Food in the Last Year: Never true  Transportation Needs: No Transportation Needs (06/18/2023)   PRAPARE - Administrator, Civil Service (Medical): No    Lack of Transportation (Non-Medical): No  Physical Activity: Sufficiently Active (06/18/2023)   Exercise Vital Sign    Days of Exercise per Week: 3 days    Minutes of Exercise per Session: 60 min  Stress: No Stress Concern Present (06/18/2023)   [employer] of Occupational Health - Occupational Stress Questionnaire    Feeling of Stress : Only a little  Social Connections: Socially Isolated (06/18/2023)   Social Connection and Isolation Panel [NHANES]    Frequency of Communication with Friends and Family: Three times a week    Frequency of Social  Gatherings with Friends and Family: Never    Attends Religious Services: Never    Database administrator or Organizations: No    Attends Engineer, structural: Not on file    Marital Status: Widowed  Intimate Partner Violence: Not At Risk (06/22/2023)   Humiliation, Afraid, Rape, and Kick questionnaire    Fear of Current or Ex-Partner: No    Emotionally Abused: No    Physically Abused: No    Sexually Abused: No   Outpatient Medications Prior to Visit  Medication Sig Dispense Refill   atorvastatin (LIPITOR) 10 MG tablet Take 1 tablet (10 mg total) by mouth at bedtime. 90 tablet 0   buPROPion (WELLBUTRIN SR) 150 MG 12 hr tablet Take 1 tablet (150 mg total) by mouth 2 (two) times  daily. 180 tablet 0   gemfibrozil (LOPID) 600 MG tablet Take 1 tablet (600 mg total) by mouth in the morning and at bedtime. 90 tablet 0   levothyroxine (SYNTHROID) 75 MCG tablet Take 1 tablet (75 mcg total) by mouth daily before breakfast. 90 tablet 0   metFORMIN (GLUCOPHAGE) 500 MG tablet Take 1 tablet (500 mg total) by mouth 2 (two) times daily with a meal. 180 tablet 0   Vitamin D, Ergocalciferol, (DRISDOL) 1.25 MG (50000 UNIT) CAPS capsule TAKE ONE CAPSULE BY MOUTH ONCE WEEKLY (EVERY 7 DAYS) 13 capsule 0   No facility-administered medications prior to visit.   No Known Allergies  ROS: A complete ROS was performed with pertinent positives/negatives noted in the HPI. The remainder of the ROS are negative.   Objective:   Today's Vitals   06/22/23 1505  BP: 122/82  Pulse: 90  SpO2: 98%  Weight: 177 lb 8 oz (80.5 kg)  Height: 5\' 1"  (1.549 m)  PainSc: 9   PainLoc: Hip    GENERAL: Well-appearing, in NAD. Well nourished.  SKIN: Pink, warm and dry. No rash, lesion, ulceration, or ecchymoses.  Head: Normocephalic. NECK: Trachea midline. Full ROM w/o pain or tenderness.  RESPIRATORY: Chest wall symmetrical. Respirations even and non-labored. Breath sounds clear to auscultation bilaterally.   CARDIAC: S1, S2 present, regular rate and rhythm without murmur or gallops. Peripheral pulses 2+ bilaterally.  MSK: Muscle tone and strength appropriate for age. No Point tenderness to right hip w/ palpation. + right hip pain with FABER AND FABIR testing of right leg. Negative straight leg raise testing bilaterally. No crepitus, deformity or step off to spine. Mild tenderness to medial anterior surface of bilateral tibia.  EXTREMITIES: Without clubbing, cyanosis, or edema.  NEUROLOGIC: No motor or sensory deficits. Steady, even gait. C2-C12 intact.  PSYCH/MENTAL STATUS: Alert, oriented x 3. Cooperative, appropriate mood and affect.    Health Maintenance Due  Topic Date Due   Colonoscopy  Never done   Cervical Cancer Screening (HPV/Pap Cotest)  12/24/2021    No results found for any visits on 06/22/23.     Assessment & Plan:  1. Encounter to establish care with new doctor Discussed role of PCP. Pt is managed currently for weight, HLD, prediabetes, hypothyroidism by Dr. Dalbert Garnet with [facility] and has upcoming appt with labs. Will continue [facility] to manage per patient request. Will obtain pap records from [place] as patient states it is UTD. Records release signed.   2. Screening for colon cancer Cologuard ordered per patient request.   Patient declines routine cscope.  We discussed the risk of this means means there could be underlying issue or malignancy not identified, increased morbidity, and even mortality.Patient verbalized understanding and acceptance of risk. Patient knows they can change their mind at any time and we will be happy to coordinate these things for them.   - Cologuard  3. Chronic right hip pain Possible bursitis versus IT band syndrome. Will obtain imaging today. Recommend joint injection pending xray but patient preferred to try Prednisone taper first. Continue NSAIDS PRN with food, ice to right hip 2-3x per day and perform stretching provided in AVS. Reach out to PCP if no  improvement in 1-2 weeks.  - DG Hip Unilat W OR W/O Pelvis 2-3 Views Right; Future   Patient to reach out to office if new, worrisome, or unresolved symptoms arise or if no improvement in patient's condition. Patient verbalized understanding and is agreeable to treatment plan. All questions answered to  patient's satisfaction.    Return in about 6 months (around 12/20/2023) for Follow up Chronic Conditions.    Yolanda Manges, FNP

## 2023-06-22 NOTE — Patient Instructions (Addendum)
Please go to the 2nd floor Orthopedic area for xray.    Use ice to right hip and bilateral shins to reduce inflammation.   [employer] - arch support assistance [employer]   If you do not hear from [facility] for your Cologuard testing, please call the office.

## 2023-06-23 NOTE — Progress Notes (Signed)
Hi Hilde,  Your hip xray is normal. There are no signs of narrowing in the joint space, degeneration from arthritis, osteoarthritis or wear and tear. I do believe the steroid, ice/heat, and stretching will help. If not, please let me know.

## 2023-06-26 ENCOUNTER — Encounter (INDEPENDENT_AMBULATORY_CARE_PROVIDER_SITE_OTHER): Payer: Self-pay | Admitting: Family Medicine

## 2023-06-29 ENCOUNTER — Ambulatory Visit (INDEPENDENT_AMBULATORY_CARE_PROVIDER_SITE_OTHER): Payer: BC Managed Care – PPO | Admitting: Family Medicine

## 2023-06-29 NOTE — Telephone Encounter (Signed)
Please reschedule appointment, Thanks

## 2023-07-03 ENCOUNTER — Other Ambulatory Visit (INDEPENDENT_AMBULATORY_CARE_PROVIDER_SITE_OTHER): Payer: Self-pay | Admitting: Family Medicine

## 2023-07-03 DIAGNOSIS — E7849 Other hyperlipidemia: Secondary | ICD-10-CM

## 2023-07-07 ENCOUNTER — Encounter (HOSPITAL_BASED_OUTPATIENT_CLINIC_OR_DEPARTMENT_OTHER): Payer: Self-pay | Admitting: Family Medicine

## 2023-07-09 LAB — LIPID PANEL WITH LDL/HDL RATIO
Cholesterol, Total: 187 mg/dL (ref 100–199)
HDL: 60 mg/dL (ref >39)
LDL Chol Calc (NIH): 108 mg/dL — ABNORMAL HIGH (ref 0–99)
LDL/HDL Ratio: 1.8 {ratio} (ref 0.0–3.2)
Triglycerides: 109 mg/dL (ref 0–149)
VLDL Cholesterol Cal: 19 mg/dL (ref 5–40)

## 2023-07-09 LAB — CMP14+EGFR
ALT: 17 [IU]/L (ref 0–32)
AST: 24 [IU]/L (ref 0–40)
Albumin: 4.3 g/dL (ref 3.9–4.9)
Alkaline Phosphatase: 90 [IU]/L (ref 44–121)
BUN/Creatinine Ratio: 12 (ref 9–23)
BUN: 13 mg/dL (ref 6–24)
Bilirubin Total: <0.2 mg/dL (ref 0.0–1.2)
CO2: 22 mmol/L (ref 20–29)
Calcium: 9.1 mg/dL (ref 8.7–10.2)
Chloride: 104 mmol/L (ref 96–106)
Creatinine, Ser: 1.07 mg/dL — ABNORMAL HIGH (ref 0.57–1.00)
Globulin, Total: 2.5 g/dL (ref 1.5–4.5)
Glucose: 90 mg/dL (ref 70–99)
Potassium: 4.3 mmol/L (ref 3.5–5.2)
Sodium: 139 mmol/L (ref 134–144)
Total Protein: 6.8 g/dL (ref 6.0–8.5)
eGFR: 63 mL/min/{1.73_m2} (ref >59)

## 2023-07-09 LAB — INSULIN, RANDOM: INSULIN: 18.2 u[IU]/mL (ref 2.6–24.9)

## 2023-07-09 LAB — VITAMIN D 25 HYDROXY (VIT D DEFICIENCY, FRACTURES): Vit D, 25-Hydroxy: 40.6 ng/mL (ref 30.0–100.0)

## 2023-07-09 LAB — HEMOGLOBIN A1C
Est. average glucose Bld gHb Est-mCnc: 120 mg/dL
Hgb A1c MFr Bld: 5.8 % — ABNORMAL HIGH (ref 4.8–5.6)

## 2023-07-09 LAB — TSH: TSH: 1.72 u[IU]/mL (ref 0.450–4.500)

## 2023-07-09 LAB — VITAMIN B12: Vitamin B-12: 537 pg/mL (ref 232–1245)

## 2023-07-13 ENCOUNTER — Ambulatory Visit (HOSPITAL_BASED_OUTPATIENT_CLINIC_OR_DEPARTMENT_OTHER): Payer: BC Managed Care – PPO | Admitting: Student

## 2023-07-13 ENCOUNTER — Encounter (HOSPITAL_BASED_OUTPATIENT_CLINIC_OR_DEPARTMENT_OTHER): Payer: Self-pay | Admitting: Student

## 2023-07-13 DIAGNOSIS — M7061 Trochanteric bursitis, right hip: Secondary | ICD-10-CM

## 2023-07-13 DIAGNOSIS — M25551 Pain in right hip: Secondary | ICD-10-CM | POA: Diagnosis not present

## 2023-07-13 MED ORDER — LIDOCAINE HCL 1 % IJ SOLN
4.0000 mL | INTRAMUSCULAR | Status: AC | PRN
  Administered 2023-07-13: 4 mL

## 2023-07-13 MED ORDER — TRIAMCINOLONE ACETONIDE 40 MG/ML IJ SUSP
2.0000 mL | INTRAMUSCULAR | Status: AC | PRN
  Administered 2023-07-13: 2 mL via INTRA_ARTICULAR

## 2023-07-13 NOTE — Progress Notes (Signed)
Chief Complaint: Right hip pain     History of Present Illness:    Betty Porter is a 52 y.o. female here today for evaluation of right hip pain.  This began over a year ago without any known injury.  Pain is located in the lateral hip and occasionally moves more toward the groin.  Rates pain as moderate to severe and this is worsened with stairs and long periods of sitting or standing.  She has been put on a course of oral steroids for this which only helped temporarily and her pain soon returned.  Had x-rays taken on 11/11 and family medicine.  Denies any other medications or treatments.   Surgical History:   None  PMH/PSH/Family History/Social History/Meds/Allergies:    Past Medical History:  Diagnosis Date   Acne    Adnexal mass    complex   Allergy 2010   Seasonal   Fibroid, uterine 2008   GERD (gastroesophageal reflux disease)    Hand swelling    Hyperlipidemia    Hypothyroidism    followed by pcp   Joint pain    Lactose intolerance    Lower extremity edema    PONV (postoperative nausea and vomiting)    SUI (stress urinary incontinence, female)    Past Surgical History:  Procedure Laterality Date   ABDOMINAL HYSTERECTOMY  2022   CESAREAN SECTION  2009   LAPAROSCOPY  2008   for infertility   TONSILLECTOMY  1977   XI ROBOTIC ASSISTED OOPHORECTOMY N/A 09/12/2020   Procedure: XI ROBOTIC ASSISTED BILATERAL SALPINGECTOMY AND LEFT OOPHORECTOMY;  Surgeon: Carver Fila, MD;  Location: [facility];  Service: Gynecology;  Laterality: N/A;   Social History   Socioeconomic History   Marital status: Widowed    Spouse name: Not on file   Number of children: Not on file   Years of education: Not on file   Highest education level: Associate degree: occupational, Scientist, product/process development, or vocational program  Occupational History   Occupation: Chiropractor  Tobacco Use   Smoking status: Former    Current  packs/day: 0.00    Average packs/day: 0.8 packs/day for 10.0 years (7.5 ttl pk-yrs)    Types: Cigarettes    Start date: 08/11/1986    Quit date: 08/11/1996    Years since quitting: 26.9   Smokeless tobacco: Never  Vaping Use   Vaping status: Never Used  Substance and Sexual Activity   Alcohol use: Not Currently    Comment: socially    Drug use: Never   Sexual activity: Not Currently    Birth control/protection: None  Other Topics Concern   Not on file  Social History Narrative   Not on file   Social Determinants of Health   Financial Resource Strain: Low Risk  (06/18/2023)   Overall Financial Resource Strain (CARDIA)    Difficulty of Paying Living Expenses: Not hard at all  Food Insecurity: No Food Insecurity (06/18/2023)   Hunger Vital Sign    Worried About Running Out of Food in the Last Year: Never true    Ran Out of Food in the Last Year: Never true  Transportation Needs: No Transportation Needs (06/18/2023)   PRAPARE - Administrator, Civil Service (Medical): No    Lack of Transportation (Non-Medical): No  Physical Activity:  Sufficiently Active (06/18/2023)   Exercise Vital Sign    Days of Exercise per Week: 3 days    Minutes of Exercise per Session: 60 min  Stress: No Stress Concern Present (06/18/2023)   [employer] of Occupational Health - Occupational Stress Questionnaire    Feeling of Stress : Only a little  Social Connections: Socially Isolated (06/18/2023)   Social Connection and Isolation Panel [NHANES]    Frequency of Communication with Friends and Family: Three times a week    Frequency of Social Gatherings with Friends and Family: Never    Attends Religious Services: Never    Database administrator or Organizations: No    Attends Engineer, structural: Not on file    Marital Status: Widowed   Family History  Problem Relation Age of Onset   Breast cancer Maternal Aunt    Cancer Maternal Aunt    Hypertension Mother    Osteoporosis  Mother    Arthritis Mother    Diabetes Father    Hyperlipidemia Father    Obesity Father    Thyroid cancer Maternal Aunt    Leukemia Maternal Uncle    Diabetes Paternal Grandfather    Cancer Maternal Uncle    Colon cancer Neg Hx    Ovarian cancer Neg Hx    Endometrial cancer Neg Hx    Pancreatic cancer Neg Hx    Prostate cancer Neg Hx    No Known Allergies Current Outpatient Medications  Medication Sig Dispense Refill   atorvastatin (LIPITOR) 10 MG tablet Take 1 tablet (10 mg total) by mouth at bedtime. 90 tablet 0   buPROPion (WELLBUTRIN SR) 150 MG 12 hr tablet Take 1 tablet (150 mg total) by mouth 2 (two) times daily. 180 tablet 0   gemfibrozil (LOPID) 600 MG tablet Take 1 tablet (600 mg total) by mouth in the morning and at bedtime. 90 tablet 0   levothyroxine (SYNTHROID) 75 MCG tablet Take 1 tablet (75 mcg total) by mouth daily before breakfast. 90 tablet 0   metFORMIN (GLUCOPHAGE) 500 MG tablet Take 1 tablet (500 mg total) by mouth 2 (two) times daily with a meal. 180 tablet 0   predniSONE (STERAPRED UNI-PAK 21 TAB) 10 MG (21) TBPK tablet Use as directed. 21 each 0   Vitamin D, Ergocalciferol, (DRISDOL) 1.25 MG (50000 UNIT) CAPS capsule TAKE ONE CAPSULE BY MOUTH ONCE WEEKLY (EVERY 7 DAYS) 13 capsule 0   No current facility-administered medications for this visit.   No results found.  Review of Systems:   A ROS was performed including pertinent positives and negatives as documented in the HPI.  Physical Exam :   Constitutional: NAD and appears stated age Neurological: Alert and oriented Psych: Appropriate affect and cooperative Last menstrual period 06/07/2023.   Comprehensive Musculoskeletal Exam:    Right hip exam demonstrates pinpoint tenderness directly over the greater trochanter.  Passive hip range of motion to 120 degrees flexion and 30 degrees of internal and external rotation.  Negative FABER and FADIR.  5/5 strength with resisted abduction.  Imaging:   Xray  review from 06/22/2023 (AP pelvis, right hip 2 views): No evidence of acute abnormality or degenerative changes at the hip joint   I personally reviewed and interpreted the radiographs.   Assessment:   52 y.o. female with 1 year history of the lateral right hip pain.  This is consistent with trochanteric bursitis.  She does retain good hip range of motion and x-rays did not show any evidence of  hip arthritis.  She did have a similar issue of her left hip over 10 years ago and received an injection which eliminated her symptoms completely.  I have offered a cortisone injection today of the right hip which patient is agreeable to.  I successfully injected the right trochanteric bursa under ultrasound guidance and she tolerated this well.  Plan to assess relief from injection and she can return as needed.  Plan :    -Greater trochanter cortisone injection performed of the right hip today -Return to clinic as needed    Procedure Note  Patient: Burns Spain             Date of Birth: Feb 03, 1971           MRN: [id number]             Visit Date: 07/13/2023  Procedures: Visit Diagnoses:  1. Trochanteric bursitis, right hip     Large Joint Inj: R greater trochanter on 07/13/2023 6:03 PM Indications: pain Details: 22 G 3.5 in needle, ultrasound-guided lateral approach Medications: 4 mL lidocaine 1 %; 2 mL triamcinolone acetonide 40 MG/ML Outcome: tolerated well, no immediate complications Procedure, treatment alternatives, risks and benefits explained, specific risks discussed. Consent was given by the patient. Immediately prior to procedure a time out was called to verify the correct patient, procedure, equipment, support staff and site/side marked as required. Patient was prepped and draped in the usual sterile fashion.      I personally saw and evaluated the patient, and participated in the management and treatment plan.  Hazle Nordmann, PA-C Orthopedics

## 2023-07-14 LAB — COLOGUARD: COLOGUARD: NEGATIVE

## 2023-07-15 NOTE — Progress Notes (Signed)
Your cologuard test is negative for colon cancer from your fecal matter sample.    This is not an all inclusive test and is not as specific as a direct visualization of the colon with a colonoscopy. Cologuard thus may provide a false negative result. It is recommended to have a colonoscopy at least every 10 years or more frequently as indicated by a GI provider. You can repeat your Cologuard test in 3 years. If you would like to have a colonoscopy please call the office at any time and we would be happy to order this for you.

## 2023-07-23 ENCOUNTER — Ambulatory Visit
Admission: RE | Admit: 2023-07-23 | Discharge: 2023-07-23 | Disposition: A | Discharge status: Home / Self Care | Payer: BC Managed Care – PPO | Source: Ambulatory Visit | Attending: Family Medicine | Admitting: Family Medicine

## 2023-07-23 DIAGNOSIS — Z1231 Encounter for screening mammogram for malignant neoplasm of breast: Secondary | ICD-10-CM

## 2023-07-28 ENCOUNTER — Encounter (INDEPENDENT_AMBULATORY_CARE_PROVIDER_SITE_OTHER): Payer: Self-pay | Admitting: Family Medicine

## 2023-07-28 ENCOUNTER — Ambulatory Visit (INDEPENDENT_AMBULATORY_CARE_PROVIDER_SITE_OTHER): Payer: BC Managed Care – PPO | Admitting: Family Medicine

## 2023-07-28 VITALS — BP 134/82 | HR 74 | Temp 98.1°F | Ht 61.0 in | Wt 171.0 lb

## 2023-07-28 DIAGNOSIS — R7303 Prediabetes: Secondary | ICD-10-CM

## 2023-07-28 DIAGNOSIS — E559 Vitamin D deficiency, unspecified: Secondary | ICD-10-CM | POA: Diagnosis not present

## 2023-07-28 DIAGNOSIS — Z6832 Body mass index (BMI) 32.0-32.9, adult: Secondary | ICD-10-CM

## 2023-07-28 DIAGNOSIS — E7849 Other hyperlipidemia: Secondary | ICD-10-CM

## 2023-07-28 DIAGNOSIS — E785 Hyperlipidemia, unspecified: Secondary | ICD-10-CM

## 2023-07-28 DIAGNOSIS — E669 Obesity, unspecified: Secondary | ICD-10-CM | POA: Diagnosis not present

## 2023-07-28 NOTE — Progress Notes (Signed)
.smr  Office: [phone]  /  Fax: [phone]  WEIGHT SUMMARY AND BIOMETRICS  Anthropometric Measurements Height: 5\' 1"  (1.549 m) Weight: 171 lb (77.6 kg) BMI (Calculated): 32.33 Weight at Last Visit: 172lb Weight Lost Since Last Visit: 1lb Weight Gained Since Last Visit: 0lb Starting Weight: 178lb Total Weight Loss (lbs): 7 lb (3.175 kg)   Body Composition  Body Fat %: 34.9 % Fat Mass (lbs): 59.8 lbs Muscle Mass (lbs): 106.2 lbs Total Body Water (lbs): 73.2 lbs Visceral Fat Rating : 8   Other Clinical Data Fasting: no Labs: no Today's Visit #: 31 Starting Date: 11/06/20    Chief Complaint: OBESITY   History of Present Illness   The patient, with a history of hyperlipidemia, prediabetes, vitamin D deficiency, and obesity, has been managing these conditions with metformin, vitamin D supplements, and Lipitor. The patient's most recent A1c was 5.8, indicating a slight improvement in her prediabetes. Her vitamin D levels are also improving, currently at 40.6 with a goal of 50-60. Despite being on Lipitor, the patient's LDL remains slightly elevated at 108.  The patient has been making dietary changes to help manage these conditions and aid in weight loss, including reducing cholesterol and simple carbohydrates in her diet. She has lost one pound in the last two months and reports following a category three eating plan about 50% of the time. For physical activity, the patient is participating in CrossFit three times a week.  The patient has noticed a positive impact from her medication regimen, particularly the metformin, which she believes helps with appetite control. She reports no negative side effects from the medication. The patient had a brief period of not taking the metformin and noticed a difference in her appetite control during that time.  The patient was previously on a round of prednisone, an anti-inflammatory steroid, but is no longer taking it. She is also on  bupropion, which she believes helps with cravings. The patient is planning a trip to [place] for the holidays and is concerned about maintaining her diet and exercise routine during this time. She aims to at least maintain her current weight during the trip.          PHYSICAL EXAM:  Blood pressure 134/82, pulse 74, temperature 98.1 F (36.7 C), height 5\' 1"  (1.549 m), weight 171 lb (77.6 kg), SpO2 99%. Body mass index is 32.31 kg/m.  DIAGNOSTIC DATA REVIEWED:  BMET    Component Value Date/Time   NA 139 07/08/2023 0918   K 4.3 07/08/2023 0918   CL 104 07/08/2023 0918   CO2 22 07/08/2023 0918   GLUCOSE 90 07/08/2023 0918   GLUCOSE 96 09/10/2020 0917   BUN 13 07/08/2023 0918   CREATININE 1.07 (H) 07/08/2023 0918   CALCIUM 9.1 07/08/2023 0918   GFRNONAA >60 09/10/2020 0917   Lab Results  Component Value Date   HGBA1C 5.8 (H) 07/08/2023   HGBA1C 5.7 (H) 11/06/2020   Lab Results  Component Value Date   INSULIN 18.2 07/08/2023   INSULIN 11.9 11/06/2020   Lab Results  Component Value Date   TSH 1.720 07/08/2023   CBC    Component Value Date/Time   WBC 6.1 11/06/2020 1216   WBC 5.7 09/10/2020 0917   RBC 4.46 11/06/2020 1216   RBC 4.50 09/10/2020 0917   HGB 13.0 11/06/2020 1216   HCT 38.8 11/06/2020 1216   PLT 299 11/06/2020 1216   MCV 87 11/06/2020 1216   MCH 29.1 11/06/2020 1216   MCH 29.3 09/10/2020  0917   MCHC 33.5 11/06/2020 1216   MCHC 33.1 09/10/2020 0917   RDW 13.1 11/06/2020 1216   Iron Studies No results found for: "IRON", "TIBC", "FERRITIN", "IRONPCTSAT" Lipid Panel     Component Value Date/Time   CHOL 187 07/08/2023 0918   TRIG 109 07/08/2023 0918   HDL 60 07/08/2023 0918   LDLCALC 108 (H) 07/08/2023 0918   Hepatic Function Panel     Component Value Date/Time   PROT 6.8 07/08/2023 0918   ALBUMIN 4.3 07/08/2023 0918   AST 24 07/08/2023 0918   ALT 17 07/08/2023 0918   ALKPHOS 90 07/08/2023 0918   BILITOT <0.2 07/08/2023 0918      Component  Value Date/Time   TSH 1.720 07/08/2023 0918   Nutritional Lab Results  Component Value Date   VD25OH 40.6 07/08/2023   VD25OH 37.4 11/19/2022   VD25OH 30.1 07/09/2022     Assessment and Plan    Prediabetes Hemoglobin A1c is 5.8%, down from 5.9% last month. Goal is 5.5%. On metformin 500 mg twice daily with good adherence and no adverse effects. Engaging in dietary modifications and exercise. Discussed metformin's role in reducing stomach hunger and bupropion in reducing cravings. Emphasized medication adherence, especially during holidays. - Continue metformin 500 mg twice daily - Continue dietary modifications to reduce simple carbohydrates - Continue CrossFit exercise regimen (60 minutes, three times per week)  Obesity Lost one pound in the last two months. Following category three eating plan about 50% of the time. Engaging in CrossFit exercise 60 minutes three times per week. Discussed holiday challenges and strategies to maintain weight. Emphasized protein intake, portion control, and medication adherence during holidays. - Continue category three eating plan - Continue CrossFit exercise regimen (60 minutes, three times per week) - Emphasize protein intake and portion control during holidays - Ensure adherence to medications  Hyperlipidemia LDL is 108 mg/dL, down from 425 mg/dL. Triglycerides are at goal (109 mg/dL). HDL is excellent at 60 mg/dL. On Lipitor and continues dietary modifications to reduce cholesterol intake. Discussed the importance of continuing dietary changes and medication adherence to further reduce LDL levels. - Continue Lipitor - Continue dietary modifications to reduce cholesterol intake  Vitamin D Deficiency Vitamin D level is 40.6 ng/mL, slowly improving. Goal is 50-60 ng/mL. On prescription vitamin D 50,000 IU per week. Discussed the importance of continuing supplementation to reach target levels. - Continue prescription vitamin D 50,000 IU per  week  General Health Maintenance Kidneys, liver, and electrolytes are in good condition. B12 levels are on target. Thyroid function is normal. - Schedule January and February appointments.       She was informed of the importance of frequent follow up visits to maximize her success with intensive lifestyle modifications for her multiple health conditions.    Quillian Quince, MD

## 2023-07-28 NOTE — Addendum Note (Signed)
Addended by: Sande Brothers on: 07/28/2023 04:10 PM   Modules accepted: Level of Service

## 2023-08-19 ENCOUNTER — Encounter (HOSPITAL_BASED_OUTPATIENT_CLINIC_OR_DEPARTMENT_OTHER): Payer: Self-pay | Admitting: Family Medicine

## 2023-09-03 ENCOUNTER — Ambulatory Visit (INDEPENDENT_AMBULATORY_CARE_PROVIDER_SITE_OTHER): Payer: Self-pay | Admitting: Family Medicine

## 2023-09-03 ENCOUNTER — Encounter (INDEPENDENT_AMBULATORY_CARE_PROVIDER_SITE_OTHER): Payer: Self-pay | Admitting: Family Medicine

## 2023-09-03 VITALS — BP 125/81 | HR 68 | Temp 97.9°F | Ht 61.0 in | Wt 171.0 lb

## 2023-09-03 DIAGNOSIS — R7303 Prediabetes: Secondary | ICD-10-CM | POA: Diagnosis not present

## 2023-09-03 DIAGNOSIS — E559 Vitamin D deficiency, unspecified: Secondary | ICD-10-CM | POA: Diagnosis not present

## 2023-09-03 DIAGNOSIS — Z6832 Body mass index (BMI) 32.0-32.9, adult: Secondary | ICD-10-CM

## 2023-09-03 DIAGNOSIS — E669 Obesity, unspecified: Secondary | ICD-10-CM

## 2023-09-03 DIAGNOSIS — F5089 Other specified eating disorder: Secondary | ICD-10-CM

## 2023-09-03 DIAGNOSIS — E785 Hyperlipidemia, unspecified: Secondary | ICD-10-CM

## 2023-09-03 DIAGNOSIS — E7849 Other hyperlipidemia: Secondary | ICD-10-CM

## 2023-09-03 DIAGNOSIS — E038 Other specified hypothyroidism: Secondary | ICD-10-CM

## 2023-09-03 DIAGNOSIS — F3289 Other specified depressive episodes: Secondary | ICD-10-CM

## 2023-09-03 MED ORDER — BUPROPION HCL ER (SR) 150 MG PO TB12: 150.0000 mg | ORAL_TABLET | Freq: Two times a day (BID) | ORAL | 0 refills | Status: DC

## 2023-09-03 MED ORDER — METFORMIN HCL 500 MG PO TABS: 500.0000 mg | ORAL_TABLET | Freq: Two times a day (BID) | ORAL | 0 refills | Status: DC

## 2023-09-03 MED ORDER — ATORVASTATIN CALCIUM 10 MG PO TABS: 10.0000 mg | ORAL_TABLET | Freq: Every day | ORAL | 0 refills | Status: DC

## 2023-09-03 MED ORDER — GEMFIBROZIL 600 MG PO TABS: 600.0000 mg | ORAL_TABLET | Freq: Two times a day (BID) | ORAL | 0 refills | Status: DC

## 2023-09-03 MED ORDER — VITAMIN D (ERGOCALCIFEROL) 1.25 MG (50000 UNIT) PO CAPS: ORAL_CAPSULE | ORAL | 0 refills | Status: DC

## 2023-09-03 MED ORDER — LEVOTHYROXINE SODIUM 75 MCG PO TABS: 75.0000 ug | ORAL_TABLET | Freq: Every day | ORAL | 0 refills | Status: DC

## 2023-09-03 NOTE — Progress Notes (Signed)
.smr  Office: [phone]  /  Fax: [phone]  WEIGHT SUMMARY AND BIOMETRICS  Anthropometric Measurements Height: 5\' 1"  (1.549 m) Weight: 171 lb (77.6 kg) BMI (Calculated): 32.33 Weight at Last Visit: 171 lb Weight Lost Since Last Visit: 0 Weight Gained Since Last Visit: 0 Starting Weight: 178 lb Total Weight Loss (lbs): 7 lb (3.175 kg)   Body Composition  Body Fat %: 35.8 % Fat Mass (lbs): 61.4 lbs Muscle Mass (lbs): 104.6 lbs Total Body Water (lbs): 71 lbs Visceral Fat Rating : 9   Other Clinical Data Fasting: No Labs: No Today's Visit #: 32 Starting Date: 11/06/20    Chief Complaint: OBESITY   History of Present Illness   The patient, with a history of obesity, prediabetes, hyperlipidemia, vitamin D deficiency, hypothyroidism, and emotional eating behaviors, presents for a routine follow-up. She is currently on metformin, Synthroid, Lipitor, Lopid, vitamin D, and Wellbutrin. The patient reports maintaining her weight over the holiday period and is adhering to the category three dietary plan about fifty percent of the time. She is engaging in CrossFit for sixty minutes three times per week, although she expresses a decrease in motivation and enjoyment for this activity.  The patient reports good control of hunger, but attributes this largely to the effects of metformin and Wellbutrin. She expresses a reliance on these medications for appetite control. The patient also reports adequate hydration, gauging this by the light color of her urine. She has no issues with sleep and is planning for an upcoming visit from her mother, which she anticipates may present dietary challenges, particularly with pastries. The patient is aware of the need for a strategy to manage these challenges and has previously found success with keeping tempting foods out of sight.          PHYSICAL EXAM:  Blood pressure 125/81, pulse 68, temperature 97.9 F (36.6 C), height 5\' 1"  (1.549 m),  weight 171 lb (77.6 kg), SpO2 91%. Body mass index is 32.31 kg/m.  DIAGNOSTIC DATA REVIEWED:  BMET    Component Value Date/Time   NA 139 07/08/2023 0918   K 4.3 07/08/2023 0918   CL 104 07/08/2023 0918   CO2 22 07/08/2023 0918   GLUCOSE 90 07/08/2023 0918   GLUCOSE 96 09/10/2020 0917   BUN 13 07/08/2023 0918   CREATININE 1.07 (H) 07/08/2023 0918   CALCIUM 9.1 07/08/2023 0918   GFRNONAA >60 09/10/2020 0917   Lab Results  Component Value Date   HGBA1C 5.8 (H) 07/08/2023   HGBA1C 5.7 (H) 11/06/2020   Lab Results  Component Value Date   INSULIN 18.2 07/08/2023   INSULIN 11.9 11/06/2020   Lab Results  Component Value Date   TSH 1.720 07/08/2023   CBC    Component Value Date/Time   WBC 6.1 11/06/2020 1216   WBC 5.7 09/10/2020 0917   RBC 4.46 11/06/2020 1216   RBC 4.50 09/10/2020 0917   HGB 13.0 11/06/2020 1216   HCT 38.8 11/06/2020 1216   PLT 299 11/06/2020 1216   MCV 87 11/06/2020 1216   MCH 29.1 11/06/2020 1216   MCH 29.3 09/10/2020 0917   MCHC 33.5 11/06/2020 1216   MCHC 33.1 09/10/2020 0917   RDW 13.1 11/06/2020 1216   Iron Studies No results found for: "IRON", "TIBC", "FERRITIN", "IRONPCTSAT" Lipid Panel     Component Value Date/Time   CHOL 187 07/08/2023 0918   TRIG 109 07/08/2023 0918   HDL 60 07/08/2023 0918   LDLCALC 108 (H) 07/08/2023 2440  Hepatic Function Panel     Component Value Date/Time   PROT 6.8 07/08/2023 0918   ALBUMIN 4.3 07/08/2023 0918   AST 24 07/08/2023 0918   ALT 17 07/08/2023 0918   ALKPHOS 90 07/08/2023 0918   BILITOT <0.2 07/08/2023 0918      Component Value Date/Time   TSH 1.720 07/08/2023 0918   Nutritional Lab Results  Component Value Date   VD25OH 40.6 07/08/2023   VD25OH 37.4 11/19/2022   VD25OH 30.1 07/09/2022     Assessment and Plan    Obesity Maintained weight over the holidays. Following category three diet plan about 50% of the time. Attending CrossFit for 60 minutes three times per week but  expressed lack of motivation. Discussed importance of continuing exercise and finding a sustainable routine. Considered alternative exercise options close to home. - Continue CrossFit three times per week - Consider alternative exercise options if motivation decreases further - Follow category two and three diet plan  Emotional Eating On Wellbutrin, which helps with hunger control. Discussed strategies to manage emotional eating, especially with upcoming challenges such as mother's visit. Suggested keeping tempting foods out of sight and choosing less tempting alternatives. - Continue Wellbutrin - Implement strategies to manage emotional eating, such as keeping tempting foods out of sight  Prediabetes On metformin with A1c improved to 5.8%. Reports metformin helps significantly with hunger control. Discussed long-term use of metformin as a safe and effective medication. - Continue metformin - Monitor A1c levels regularly  Hyperlipidemia On Lipitor and Lopid. No specific lab results discussed in this visit, but maintaining medication regimen. - Continue Lipitor - Continue Lopid  Vitamin D Deficiency On vitamin D supplementation. Levels improving but not yet at goal. - Continue vitamin D supplementation - Monitor vitamin D levels  Hypothyroidism On Synthroid. TSH levels within normal range. - Continue Synthroid - Monitor TSH levels  General Health Maintenance Discussed hydration and importance of monitoring urine color to ensure adequate hydration. Reports good sleep quality and no current issues. - Monitor urine color for hydration status - Maintain good sleep hygiene  Follow-up - Adjust February appointment - Schedule March appointment.          She was informed of the importance of frequent follow up visits to maximize her success with intensive lifestyle modifications for her multiple health conditions.    Quillian Quince, MD

## 2023-09-11 ENCOUNTER — Encounter (HOSPITAL_BASED_OUTPATIENT_CLINIC_OR_DEPARTMENT_OTHER): Payer: Self-pay | Admitting: Family Medicine

## 2023-09-21 NOTE — Telephone Encounter (Signed)
 Betty Porter, please see mychart sent about new referral needed.

## 2023-10-01 ENCOUNTER — Encounter (INDEPENDENT_AMBULATORY_CARE_PROVIDER_SITE_OTHER): Payer: Self-pay | Admitting: Family Medicine

## 2023-10-01 ENCOUNTER — Ambulatory Visit (INDEPENDENT_AMBULATORY_CARE_PROVIDER_SITE_OTHER): Payer: BC Managed Care – PPO | Admitting: Family Medicine

## 2023-10-07 ENCOUNTER — Ambulatory Visit (INDEPENDENT_AMBULATORY_CARE_PROVIDER_SITE_OTHER): Payer: 59 | Admitting: Physician Assistant

## 2023-10-07 NOTE — Progress Notes (Deleted)
   SUBJECTIVE:  Chief Complaint: Obesity  Interim History: ***  Betty Porter is here to discuss her progress with her obesity treatment plan. She is on the {HWW Weight Loss Plan:210964005} and states she {CHL AMB IS/IS NOT:210130109} following her eating plan approximately *** % of the time. She states she {CHL AMB IS/IS NOT:210130109} exercising *** minutes *** times per week.   OBJECTIVE: Visit Diagnoses: Problem List Items Addressed This Visit     Vitamin D deficiency   Pre-diabetes - Primary   Depression   Generalized obesity    No data recorded No data recorded No data recorded No data recorded   ASSESSMENT AND PLAN:  Diet: Betty Porter is currently in the action stage of change. As such, her goal is to continue with weight loss efforts and has agreed to {MWMwtlossportion/plan2:23431}.   Exercise:  {MWM Exercise Recommendations:210964029}  Behavior Modification:  We discussed the following Behavioral Modification Strategies today: {HWW Behavior Modification:210964008}. We discussed various medication options to help Betty Porter with her weight loss efforts and we both agreed to ***.  No follow-ups on file.Marland Kitchen She was informed of the importance of frequent follow up visits to maximize her success with intensive lifestyle modifications for her multiple health conditions.  Attestation Statements:   Reviewed by clinician on day of visit: allergies, medications, problem list, medical history, surgical history, family history, social history, and previous encounter notes.   Time spent on visit including pre-visit chart review and post-visit care and charting was *** minutes  Betty Castleman,PA-C

## 2023-10-19 ENCOUNTER — Other Ambulatory Visit (INDEPENDENT_AMBULATORY_CARE_PROVIDER_SITE_OTHER): Payer: Self-pay | Admitting: Family Medicine

## 2023-10-19 DIAGNOSIS — E7849 Other hyperlipidemia: Secondary | ICD-10-CM

## 2023-10-29 ENCOUNTER — Other Ambulatory Visit (INDEPENDENT_AMBULATORY_CARE_PROVIDER_SITE_OTHER): Payer: Self-pay | Admitting: Family Medicine

## 2023-10-29 DIAGNOSIS — E559 Vitamin D deficiency, unspecified: Secondary | ICD-10-CM

## 2023-11-04 ENCOUNTER — Encounter (INDEPENDENT_AMBULATORY_CARE_PROVIDER_SITE_OTHER): Payer: Self-pay | Admitting: Family Medicine

## 2023-11-04 ENCOUNTER — Ambulatory Visit (INDEPENDENT_AMBULATORY_CARE_PROVIDER_SITE_OTHER): Payer: Self-pay | Admitting: Family Medicine

## 2023-11-04 VITALS — BP 115/75 | HR 80 | Temp 97.5°F | Ht 61.0 in | Wt 173.0 lb

## 2023-11-04 DIAGNOSIS — E7849 Other hyperlipidemia: Secondary | ICD-10-CM | POA: Diagnosis not present

## 2023-11-04 DIAGNOSIS — R7303 Prediabetes: Secondary | ICD-10-CM

## 2023-11-04 DIAGNOSIS — E669 Obesity, unspecified: Secondary | ICD-10-CM

## 2023-11-04 DIAGNOSIS — F32A Depression, unspecified: Secondary | ICD-10-CM

## 2023-11-04 DIAGNOSIS — E039 Hypothyroidism, unspecified: Secondary | ICD-10-CM

## 2023-11-04 DIAGNOSIS — Z6832 Body mass index (BMI) 32.0-32.9, adult: Secondary | ICD-10-CM

## 2023-11-04 DIAGNOSIS — E038 Other specified hypothyroidism: Secondary | ICD-10-CM

## 2023-11-04 DIAGNOSIS — F3289 Other specified depressive episodes: Secondary | ICD-10-CM

## 2023-11-04 DIAGNOSIS — E559 Vitamin D deficiency, unspecified: Secondary | ICD-10-CM

## 2023-11-04 MED ORDER — GEMFIBROZIL 600 MG PO TABS: 600.0000 mg | ORAL_TABLET | Freq: Two times a day (BID) | ORAL | 0 refills | Status: DC

## 2023-11-04 MED ORDER — METFORMIN HCL 500 MG PO TABS
500.0000 mg | ORAL_TABLET | Freq: Two times a day (BID) | ORAL | 0 refills | Status: DC
Start: 2023-11-04 — End: 2023-12-09

## 2023-11-04 MED ORDER — ATORVASTATIN CALCIUM 10 MG PO TABS: 10.0000 mg | ORAL_TABLET | Freq: Every day | ORAL | 0 refills | Status: DC

## 2023-11-04 MED ORDER — LEVOTHYROXINE SODIUM 75 MCG PO TABS: 75.0000 ug | ORAL_TABLET | Freq: Every day | ORAL | 0 refills | Status: DC

## 2023-11-04 MED ORDER — BUPROPION HCL ER (SR) 150 MG PO TB12: 150.0000 mg | ORAL_TABLET | Freq: Two times a day (BID) | ORAL | 0 refills | Status: DC

## 2023-11-04 MED ORDER — VITAMIN D (ERGOCALCIFEROL) 1.25 MG (50000 UNIT) PO CAPS: ORAL_CAPSULE | ORAL | 0 refills | Status: DC

## 2023-11-04 NOTE — Assessment & Plan Note (Signed)
 Last A1c of 5.8.  She is on glucophage 2x a day with meals.  She does not need a refill of her medication today.  No GI side effects of metformin mentioned.

## 2023-11-04 NOTE — Progress Notes (Signed)
 SUBJECTIVE:  Chief Complaint: Obesity  Interim History: Patients mother has been visiting for 3 weeks and mentions that has really sidetracked her.  She realizes that with her mother being there she did indulge in more simple carbohydrates.  Mother has since left and she has recommitted to the meal plan since that time. She is on a combination of Category 2 and Category 3.   She is going on a cruise in the next month and going to [place].    Betty Porter is here to discuss her progress with her obesity treatment plan. She is on the Category 2 Plan or Category 3 Plan and states she is following her eating plan approximately 30-40 % of the time. She states she is exercising cross fit for 60 minutes 3-4 times per week.   OBJECTIVE: Visit Diagnoses: Problem List Items Addressed This Visit       Endocrine   Hypothyroidism   On levothyroxine daily.  No cold intolerance, heat intolerance or palpitations mentioned.  Needs a refill today of synthroid.      Relevant Medications   levothyroxine (SYNTHROID) 75 MCG tablet     Other   Vitamin D deficiency   Doing well on prescription strength Vitamin d- no nausea, vomiting or muscle weakness.  Needs a refill today.      Relevant Medications   Vitamin D, Ergocalciferol, (DRISDOL) 1.25 MG (50000 UNIT) CAPS capsule   Pre-diabetes - Primary   Last A1c of 5.8.  She is on glucophage 2x a day with meals.  She does not need a refill of her medication today.  No GI side effects of metformin mentioned.       Relevant Medications   metFORMIN (GLUCOPHAGE) 500 MG tablet   Other hyperlipidemia   On combination medication therapy.  Needs refill of gemfibrozil and atorvastatin.  No side effects mentioned.      Relevant Medications   gemfibrozil (LOPID) 600 MG tablet   atorvastatin (LIPITOR) 10 MG tablet   Depression   On bupropion daily.  Needs refill of 150mg  dose.  Some improvement in her emotional eating and cravings with bupropion.       Relevant Medications   buPROPion (WELLBUTRIN SR) 150 MG 12 hr tablet   Generalized obesity   Anthropometric Measurements Height: 5\' 1"  (1.549 m) Weight: 173 lb (78.5 kg) BMI (Calculated): 32.7 Weight at Last Visit: 171 lb Weight Lost Since Last Visit: 0 Weight Gained Since Last Visit: 2 lb Starting Weight: 178 lb Total Weight Loss (lbs): 5 lb (2.268 kg) Peak Weight: 178 lb Body Composition  Body Fat %: 36.8 % Fat Mass (lbs): 63.8 lbs Muscle Mass (lbs): 104.2 lbs Total Body Water (lbs): 74 lbs Visceral Fat Rating : 9 Body Composition  Body Fat %: 36.8 % Fat Mass (lbs): 63.8 lbs Muscle Mass (lbs): 104.2 lbs Total Body Water (lbs): 74 lbs Visceral Fat Rating : 9       Relevant Medications   metFORMIN (GLUCOPHAGE) 500 MG tablet   Other Visit Diagnoses       BMI 32.0-32.9,adult           No data recorded    11/04/2023    3:00 PM 09/03/2023    3:00 PM 07/28/2023    3:00 PM  Vitals with BMI  Height 5\' 1"  5\' 1"  5\' 1"   Weight 173 lbs 171 lbs 171 lbs  BMI 32.7 32.33 32.33  Systolic 115 125 130  Diastolic 75 81 82  Pulse 80 68 74  Body Composition  Body Fat %: 36.8 % Fat Mass (lbs): 63.8 lbs Muscle Mass (lbs): 104.2 lbs Total Body Water (lbs): 74 lbs Visceral Fat Rating : 9    ASSESSMENT AND PLAN:  Diet: Betty Porter is currently in the action stage of change. As such, her goal is to continue with weight loss efforts and has agreed to the Category 2 Plan and the Category 3 Plan.   Exercise:  For additional and more extensive health benefits, adults should increase their aerobic physical activity to 300 minutes (5 hours) a week of moderate-intensity, or 150 minutes a week of vigorous-intensity aerobic physical activity, or an equivalent combination of moderate- and vigorous-intensity activity. Additional health benefits are gained by engaging in physical activity beyond this amount.   Behavior Modification:  We discussed the following Behavioral Modification  Strategies today: increasing lean protein intake, meal planning and cooking strategies, keeping healthy foods in the home, better snacking choices, and planning for success.   Return in about 4 weeks (around 12/02/2023) for repeat IC.Marland Kitchen She was informed of the importance of frequent follow up visits to maximize her success with intensive lifestyle modifications for her multiple health conditions.  Attestation Statements:   Reviewed by clinician on day of visit: allergies, medications, problem list, medical history, surgical history, family history, social history, and previous encounter notes.    Reuben Likes, MD

## 2023-11-18 NOTE — Assessment & Plan Note (Signed)
 On levothyroxine daily.  No cold intolerance, heat intolerance or palpitations mentioned.  Needs a refill today of synthroid.

## 2023-11-18 NOTE — Assessment & Plan Note (Signed)
 Anthropometric Measurements Height: 5\' 1"  (1.549 m) Weight: 173 lb (78.5 kg) BMI (Calculated): 32.7 Weight at Last Visit: 171 lb Weight Lost Since Last Visit: 0 Weight Gained Since Last Visit: 2 lb Starting Weight: 178 lb Total Weight Loss (lbs): 5 lb (2.268 kg) Peak Weight: 178 lb Body Composition  Body Fat %: 36.8 % Fat Mass (lbs): 63.8 lbs Muscle Mass (lbs): 104.2 lbs Total Body Water (lbs): 74 lbs Visceral Fat Rating : 9 Body Composition  Body Fat %: 36.8 % Fat Mass (lbs): 63.8 lbs Muscle Mass (lbs): 104.2 lbs Total Body Water (lbs): 74 lbs Visceral Fat Rating : 9

## 2023-11-18 NOTE — Assessment & Plan Note (Signed)
 On combination medication therapy.  Needs refill of gemfibrozil and atorvastatin.  No side effects mentioned.

## 2023-11-18 NOTE — Assessment & Plan Note (Signed)
 Doing well on prescription strength Vitamin d- no nausea, vomiting or muscle weakness.  Needs a refill today.

## 2023-11-18 NOTE — Assessment & Plan Note (Signed)
 On bupropion daily.  Needs refill of 150mg  dose.  Some improvement in her emotional eating and cravings with bupropion.

## 2023-12-09 ENCOUNTER — Encounter (INDEPENDENT_AMBULATORY_CARE_PROVIDER_SITE_OTHER): Payer: Self-pay | Admitting: Family Medicine

## 2023-12-09 ENCOUNTER — Ambulatory Visit (INDEPENDENT_AMBULATORY_CARE_PROVIDER_SITE_OTHER): Admitting: Family Medicine

## 2023-12-09 VITALS — BP 109/74 | HR 68 | Temp 98.0°F | Ht 61.0 in | Wt 174.0 lb

## 2023-12-09 DIAGNOSIS — E7849 Other hyperlipidemia: Secondary | ICD-10-CM

## 2023-12-09 DIAGNOSIS — E039 Hypothyroidism, unspecified: Secondary | ICD-10-CM

## 2023-12-09 DIAGNOSIS — Z6832 Body mass index (BMI) 32.0-32.9, adult: Secondary | ICD-10-CM

## 2023-12-09 DIAGNOSIS — R7303 Prediabetes: Secondary | ICD-10-CM | POA: Diagnosis not present

## 2023-12-09 DIAGNOSIS — G8929 Other chronic pain: Secondary | ICD-10-CM | POA: Diagnosis not present

## 2023-12-09 DIAGNOSIS — F3289 Other specified depressive episodes: Secondary | ICD-10-CM

## 2023-12-09 DIAGNOSIS — E038 Other specified hypothyroidism: Secondary | ICD-10-CM

## 2023-12-09 DIAGNOSIS — R0602 Shortness of breath: Secondary | ICD-10-CM | POA: Diagnosis not present

## 2023-12-09 DIAGNOSIS — M549 Dorsalgia, unspecified: Secondary | ICD-10-CM | POA: Diagnosis not present

## 2023-12-09 DIAGNOSIS — E669 Obesity, unspecified: Secondary | ICD-10-CM

## 2023-12-09 DIAGNOSIS — Z6833 Body mass index (BMI) 33.0-33.9, adult: Secondary | ICD-10-CM

## 2023-12-09 DIAGNOSIS — E785 Hyperlipidemia, unspecified: Secondary | ICD-10-CM

## 2023-12-09 DIAGNOSIS — F5089 Other specified eating disorder: Secondary | ICD-10-CM

## 2023-12-09 DIAGNOSIS — E559 Vitamin D deficiency, unspecified: Secondary | ICD-10-CM

## 2023-12-09 MED ORDER — METFORMIN HCL 500 MG PO TABS: 500.0000 mg | ORAL_TABLET | Freq: Two times a day (BID) | ORAL | 0 refills | Status: DC

## 2023-12-09 MED ORDER — BUPROPION HCL ER (SR) 150 MG PO TB12: 150.0000 mg | ORAL_TABLET | Freq: Two times a day (BID) | ORAL | 0 refills | Status: DC

## 2023-12-09 MED ORDER — VITAMIN D (ERGOCALCIFEROL) 1.25 MG (50000 UNIT) PO CAPS: ORAL_CAPSULE | ORAL | 0 refills | Status: DC

## 2023-12-09 MED ORDER — ATORVASTATIN CALCIUM 10 MG PO TABS: 10.0000 mg | ORAL_TABLET | Freq: Every day | ORAL | 0 refills | Status: DC

## 2023-12-09 MED ORDER — GEMFIBROZIL 600 MG PO TABS: 600.0000 mg | ORAL_TABLET | Freq: Two times a day (BID) | ORAL | 0 refills | Status: DC

## 2023-12-09 MED ORDER — LEVOTHYROXINE SODIUM 75 MCG PO TABS: 75.0000 ug | ORAL_TABLET | Freq: Every day | ORAL | 0 refills | Status: DC

## 2023-12-09 NOTE — Progress Notes (Signed)
 Office: [phone]  /  Fax: [phone]  WEIGHT SUMMARY AND BIOMETRICS  Anthropometric Measurements Height: 5\' 1"  (1.549 m) Weight: 174 lb (78.9 kg) BMI (Calculated): 32.89 Weight at Last Visit: 173lb Weight Lost Since Last Visit: 0 Weight Gained Since Last Visit: 1lb Starting Weight: 178lb Total Weight Loss (lbs): 4 lb (1.814 kg) Peak Weight: 178lb   Body Composition  Body Fat %: 36.8 % Fat Mass (lbs): 64.2 lbs Muscle Mass (lbs): 104.6 lbs Total Body Water (lbs): 73 lbs Visceral Fat Rating : 9   Other Clinical Data RMR: 1354 Fasting: yes Labs: yes Today's Visit #: 34 Starting Date: 11/06/20 Comments: IC DONE TODAY    Chief Complaint: OBESITY   History of Present Illness Betty Porter is a 53 year old female who presents for obesity treatment and progress assessment.  She is currently fluctuating between category two and category three of her treatment plan, adhering to it about 20% of the time. She has not been exercising and has gained one pound in the last month since her last visit. Her resting metabolic rate has decreased from 1650 calories in 2022 to 1350 calories currently, despite a slight weight decrease from 178 pounds to 174 pounds.  She has a history of shortness of breath with exercise and has not been exercising for about a month due to back pain, which was diagnosed as sciatica. She has received two cortisone shots for hip pain, diagnosed as bursitis, but they did not provide relief. She is awaiting an MRI to further investigate her hip pain.  Her current medications include Lipitor and gemfibrozil  for cholesterol, Synthroid  for thyroid  management, vitamin D  supplements, Wellbutrin  for emotional eating, and metformin  for prediabetes. She takes both doses of Wellbutrin  and metformin  regularly.  She recently signed up for a gym membership at the [facility] and plans to engage in swimming as part of her exercise routine. She enjoys gardening and has  been spending time growing flowers and vegetables.      PHYSICAL EXAM:  Blood pressure 109/74, pulse 68, temperature 98 F (36.7 C), height 5\' 1"  (1.549 m), weight 174 lb (78.9 kg), last menstrual period 10/28/2023, SpO2 97%. Body mass index is 32.88 kg/m.  DIAGNOSTIC DATA REVIEWED:  BMET    Component Value Date/Time   NA 139 07/08/2023 0918   K 4.3 07/08/2023 0918   CL 104 07/08/2023 0918   CO2 22 07/08/2023 0918   GLUCOSE 90 07/08/2023 0918   GLUCOSE 96 09/10/2020 0917   BUN 13 07/08/2023 0918   CREATININE 1.07 (H) 07/08/2023 0918   CALCIUM  9.1 07/08/2023 0918   GFRNONAA >60 09/10/2020 0917   Lab Results  Component Value Date   HGBA1C 5.8 (H) 07/08/2023   HGBA1C 5.7 (H) 11/06/2020   Lab Results  Component Value Date   INSULIN  18.2 07/08/2023   INSULIN  11.9 11/06/2020   Lab Results  Component Value Date   TSH 1.720 07/08/2023   CBC    Component Value Date/Time   WBC 6.1 11/06/2020 1216   WBC 5.7 09/10/2020 0917   RBC 4.46 11/06/2020 1216   RBC 4.50 09/10/2020 0917   HGB 13.0 11/06/2020 1216   HCT 38.8 11/06/2020 1216   PLT 299 11/06/2020 1216   MCV 87 11/06/2020 1216   MCH 29.1 11/06/2020 1216   MCH 29.3 09/10/2020 0917   MCHC 33.5 11/06/2020 1216   MCHC 33.1 09/10/2020 0917   RDW 13.1 11/06/2020 1216   Iron Studies No results found for: "IRON", "TIBC", "FERRITIN", "  IRONPCTSAT" Lipid Panel     Component Value Date/Time   CHOL 187 07/08/2023 0918   TRIG 109 07/08/2023 0918   HDL 60 07/08/2023 0918   LDLCALC 108 (H) 07/08/2023 0918   Hepatic Function Panel     Component Value Date/Time   PROT 6.8 07/08/2023 0918   ALBUMIN 4.3 07/08/2023 0918   AST 24 07/08/2023 0918   ALT 17 07/08/2023 0918   ALKPHOS 90 07/08/2023 0918   BILITOT <0.2 07/08/2023 0918      Component Value Date/Time   TSH 1.720 07/08/2023 0918   Nutritional Lab Results  Component Value Date   VD25OH 40.6 07/08/2023   VD25OH 37.4 11/19/2022   VD25OH 30.1 07/09/2022      Assessment and Plan Assessment & Plan Obesity Obesity management is complicated by a decrease in resting metabolic rate from 1650 to 1350 calories, attributed to lack of exercise due to chronic back pain and potential inadequate protein intake. Current weight is 174 pounds, down from 178 pounds. Exercise is limited by sciatic nerve and hip pain, with the chiropractor suspecting bursitis, though bilateral presentation is unusual. Maintaining a minimum of 75 grams of protein daily and caloric intake above 1200 calories is crucial to prevent further metabolic decline. Swimming is encouraged as tolerated. - Advise maintaining a minimum of 75 grams of protein daily. - Advise not to consume less than 1200 calories daily. - Encourage swimming as tolerated. - Refill Lipitor, gemfibrozil , Synthroid , vitamin D , Wellbutrin , and metformin .  Chronic back pain Chronic back pain is associated with sciatic nerve and hip pain. She is under chiropractic care, with an MRI being considered for further evaluation of hip pain. Previous cortisone injections for suspected bursitis were ineffective, and bilateral bursitis is unusual. - Follow up with chiropractor for MRI of hips. - Continue chiropractic care for sciatic nerve pain.  Shortness of breath with exercise Shortness of breath with exercise was noted. -IC repeated today - will continue to work on weight loss and increase exercise slowly to improve exercise tolerance  Emotional eating behaviors Emotional eating is managed with Wellbutrin , which she is taking consistently. - Continue Wellbutrin  for emotional eating. - EEB strategies discussed today  Dyslipidemia Dyslipidemia is managed with Lipitor and gemfibrozil , with compliance to the medication regimen. - Refill Lipitor and gemfibrozil . -Continue diet, exercise and weight loss  Hypothyroidism Hypothyroidism is managed with Synthroid . Thyroid  function tests, including TSH, free T4, and T3  levels, will be conducted to ensure appropriate management. - Refill Synthroid . - Order thyroid  function tests including TSH, free T4, and T3.    She was informed of the importance of frequent follow up visits to maximize her success with intensive lifestyle modifications for her multiple health conditions.    Jasmine Mesi, MD

## 2023-12-10 LAB — LIPID PANEL WITH LDL/HDL RATIO
Cholesterol, Total: 210 mg/dL — ABNORMAL HIGH (ref 100–199)
HDL: 61 mg/dL (ref >39)
LDL Chol Calc (NIH): 127 mg/dL — ABNORMAL HIGH (ref 0–99)
LDL/HDL Ratio: 2.1 ratio (ref 0.0–3.2)
Triglycerides: 125 mg/dL (ref 0–149)
VLDL Cholesterol Cal: 22 mg/dL (ref 5–40)

## 2023-12-10 LAB — CMP14+EGFR
ALT: 26 IU/L (ref 0–32)
AST: 24 IU/L (ref 0–40)
Albumin: 4.4 g/dL (ref 3.8–4.9)
Alkaline Phosphatase: 81 IU/L (ref 44–121)
BUN/Creatinine Ratio: 14 (ref 9–23)
BUN: 13 mg/dL (ref 6–24)
Bilirubin Total: 0.3 mg/dL (ref 0.0–1.2)
CO2: 21 mmol/L (ref 20–29)
Calcium: 9.4 mg/dL (ref 8.7–10.2)
Chloride: 103 mmol/L (ref 96–106)
Creatinine, Ser: 0.94 mg/dL (ref 0.57–1.00)
Globulin, Total: 2.5 g/dL (ref 1.5–4.5)
Glucose: 92 mg/dL (ref 70–99)
Potassium: 4.8 mmol/L (ref 3.5–5.2)
Sodium: 139 mmol/L (ref 134–144)
Total Protein: 6.9 g/dL (ref 6.0–8.5)
eGFR: 73 mL/min/{1.73_m2} (ref >59)

## 2023-12-10 LAB — VITAMIN D 25 HYDROXY (VIT D DEFICIENCY, FRACTURES): Vit D, 25-Hydroxy: 47.1 ng/mL (ref 30.0–100.0)

## 2023-12-10 LAB — HEMOGLOBIN A1C
Est. average glucose Bld gHb Est-mCnc: 111 mg/dL
Hgb A1c MFr Bld: 5.5 % (ref 4.8–5.6)

## 2023-12-10 LAB — T3: T3, Total: 103 ng/dL (ref 71–180)

## 2023-12-10 LAB — TSH: TSH: 1.96 u[IU]/mL (ref 0.450–4.500)

## 2023-12-10 LAB — INSULIN, RANDOM: INSULIN: 15.2 u[IU]/mL (ref 2.6–24.9)

## 2023-12-10 LAB — VITAMIN B12: Vitamin B-12: 659 pg/mL (ref 232–1245)

## 2023-12-10 LAB — T4, FREE: Free T4: 1.33 ng/dL (ref 0.82–1.77)

## 2023-12-21 ENCOUNTER — Ambulatory Visit (HOSPITAL_BASED_OUTPATIENT_CLINIC_OR_DEPARTMENT_OTHER): Payer: BC Managed Care – PPO | Admitting: Family Medicine

## 2023-12-21 ENCOUNTER — Other Ambulatory Visit (INDEPENDENT_AMBULATORY_CARE_PROVIDER_SITE_OTHER): Payer: Self-pay | Admitting: Family Medicine

## 2023-12-21 DIAGNOSIS — E7849 Other hyperlipidemia: Secondary | ICD-10-CM

## 2023-12-28 ENCOUNTER — Ambulatory Visit (INDEPENDENT_AMBULATORY_CARE_PROVIDER_SITE_OTHER): Admitting: Family Medicine

## 2023-12-28 ENCOUNTER — Encounter (INDEPENDENT_AMBULATORY_CARE_PROVIDER_SITE_OTHER): Payer: Self-pay

## 2024-01-27 ENCOUNTER — Ambulatory Visit (HOSPITAL_BASED_OUTPATIENT_CLINIC_OR_DEPARTMENT_OTHER): Payer: Self-pay | Admitting: Orthopaedic Surgery

## 2024-01-27 ENCOUNTER — Other Ambulatory Visit (HOSPITAL_BASED_OUTPATIENT_CLINIC_OR_DEPARTMENT_OTHER): Payer: Self-pay

## 2024-01-27 ENCOUNTER — Encounter (HOSPITAL_BASED_OUTPATIENT_CLINIC_OR_DEPARTMENT_OTHER): Payer: Self-pay

## 2024-01-27 DIAGNOSIS — S73191A Other sprain of right hip, initial encounter: Secondary | ICD-10-CM

## 2024-01-27 MED ORDER — OXYCODONE HCL 5 MG PO TABS: 5.0000 mg | ORAL_TABLET | ORAL | 0 refills | Status: DC | PRN

## 2024-01-27 MED ORDER — ASPIRIN 325 MG PO TBEC: 325.0000 mg | DELAYED_RELEASE_TABLET | Freq: Every day | ORAL | 0 refills | Status: DC

## 2024-01-27 MED ORDER — IBUPROFEN 800 MG PO TABS: 800.0000 mg | ORAL_TABLET | Freq: Three times a day (TID) | ORAL | 0 refills | Status: AC

## 2024-01-27 MED ORDER — ACETAMINOPHEN 500 MG PO TABS: 500.0000 mg | ORAL_TABLET | Freq: Three times a day (TID) | ORAL | 0 refills | Status: AC

## 2024-01-27 NOTE — Progress Notes (Signed)
 Chief Complaint: Right hip pain        History of Present Illness:    01/27/2024: Follow-up for right hip pain.  She is here today for MRI discussion.  She is still having persistent femoral acetabular type pain Betty Porter is a 53 y.o. female here today for evaluation of right hip pain.  This began over a year ago without any known injury.  Pain is located in the lateral hip and occasionally moves more toward the groin.  Rates pain as moderate to severe and this is worsened with stairs and long periods of sitting or standing.  She has been put on a course of oral steroids for this which only helped temporarily and her pain soon returned.  Had x-rays taken on 11/11 and family medicine.  Denies any other medications or treatments.     Surgical History:   None   PMH/PSH/Family History/Social History/Meds/Allergies:         Past Medical History:  Diagnosis Date   Acne     Adnexal mass      complex   Allergy 2010    Seasonal   Fibroid, uterine 2008   GERD (gastroesophageal reflux disease)     Hand swelling     Hyperlipidemia     Hypothyroidism      followed by pcp   Joint pain     Lactose intolerance     Lower extremity edema     PONV (postoperative nausea and vomiting)     SUI (stress urinary incontinence, female)               Past Surgical History:  Procedure Laterality Date   ABDOMINAL HYSTERECTOMY   2022   CESAREAN SECTION   2009   LAPAROSCOPY   2008    for infertility   TONSILLECTOMY   1977   XI ROBOTIC ASSISTED OOPHORECTOMY N/A 09/12/2020    Procedure: XI ROBOTIC ASSISTED BILATERAL SALPINGECTOMY AND LEFT OOPHORECTOMY;  Surgeon: Suzi Essex, MD;  Location: [facility] [place];  Service: Gynecology;  Laterality: N/A;        Social History         Socioeconomic History   Marital status: Widowed      Spouse name: Not on file   Number of children: Not on file   Years of education: Not on file   Highest  education level: Associate degree: occupational, Scientist, product/process development, or vocational program  Occupational History   Occupation: Chiropractor  Tobacco Use   Smoking status: Former      Current packs/day: 0.00      Average packs/day: 0.8 packs/day for 10.0 years (7.5 ttl pk-yrs)      Types: Cigarettes      Start date: 08/11/1986      Quit date: 08/11/1996      Years since quitting: 26.9   Smokeless tobacco: Never  Vaping Use   Vaping status: Never Used  Substance and Sexual Activity   Alcohol use: Not Currently      Comment: socially    Drug use: Never   Sexual activity: Not Currently      Birth control/protection: None  Other Topics Concern   Not on file  Social History Narrative   Not on file    Social Determinants of Health  Financial Resource Strain: Low Risk  (06/18/2023)    Overall Financial Resource Strain (CARDIA)     Difficulty of Paying Living Expenses: Not hard at all  Food Insecurity: No Food Insecurity (06/18/2023)    Hunger Vital Sign     Worried About Running Out of Food in the Last Year: Never true     Ran Out of Food in the Last Year: Never true  Transportation Needs: No Transportation Needs (06/18/2023)    PRAPARE - Therapist, art (Medical): No     Lack of Transportation (Non-Medical): No  Physical Activity: Sufficiently Active (06/18/2023)    Exercise Vital Sign     Days of Exercise per Week: 3 days     Minutes of Exercise per Session: 60 min  Stress: No Stress Concern Present (06/18/2023)    [employer] of Occupational Health - Occupational Stress Questionnaire     Feeling of Stress : Only a little  Social Connections: Socially Isolated (06/18/2023)    Social Connection and Isolation Panel [NHANES]     Frequency of Communication with Friends and Family: Three times a week     Frequency of Social Gatherings with Friends and Family: Never     Attends Religious Services: Never     Database administrator or Organizations: No      Attends Engineer, structural: Not on file     Marital Status: Widowed         Family History  Problem Relation Age of Onset   Breast cancer Maternal Aunt     Cancer Maternal Aunt     Hypertension Mother     Osteoporosis Mother     Arthritis Mother     Diabetes Father     Hyperlipidemia Father     Obesity Father     Thyroid  cancer Maternal Aunt     Leukemia Maternal Uncle     Diabetes Paternal Grandfather     Cancer Maternal Uncle     Colon cancer Neg Hx     Ovarian cancer Neg Hx     Endometrial cancer Neg Hx     Pancreatic cancer Neg Hx     Prostate cancer Neg Hx          Allergies  No Known Allergies         Current Outpatient Medications  Medication Sig Dispense Refill   atorvastatin  (LIPITOR) 10 MG tablet Take 1 tablet (10 mg total) by mouth at bedtime. 90 tablet 0   buPROPion  (WELLBUTRIN  SR) 150 MG 12 hr tablet Take 1 tablet (150 mg total) by mouth 2 (two) times daily. 180 tablet 0   gemfibrozil  (LOPID ) 600 MG tablet Take 1 tablet (600 mg total) by mouth in the morning and at bedtime. 90 tablet 0   levothyroxine  (SYNTHROID ) 75 MCG tablet Take 1 tablet (75 mcg total) by mouth daily before breakfast. 90 tablet 0   metFORMIN  (GLUCOPHAGE ) 500 MG tablet Take 1 tablet (500 mg total) by mouth 2 (two) times daily with a meal. 180 tablet 0   predniSONE  (STERAPRED UNI-PAK 21 TAB) 10 MG (21) TBPK tablet Use as directed. 21 each 0   Vitamin D , Ergocalciferol , (DRISDOL ) 1.25 MG (50000 UNIT) CAPS capsule TAKE ONE CAPSULE BY MOUTH ONCE WEEKLY (EVERY 7 DAYS) 13 capsule 0      No current facility-administered medications for this visit.      Imaging Results (Last 48 hours)  No results found.  Review of Systems:   A ROS was performed including pertinent positives and negatives as documented in the HPI.   Physical Exam :   Constitutional: NAD and appears stated age Neurological: Alert and oriented Psych: Appropriate affect and cooperative Last menstrual period  06/07/2023.    Comprehensive Musculoskeletal Exam:     Right hip exam demonstrates pinpoint tenderness directly over the greater trochanter.  Passive hip range of motion to 120 degrees flexion and 30 degrees of internal and external rotation.  Negative FABER and FADIR.  5/5 strength with resisted abduction.   Imaging:   Xray review from 06/22/2023 (AP pelvis, right hip 2 views): No evidence of acute abnormality or degenerative changes at the hip joint   MRI right hip: Full-thickness anterior superior labral tear   I personally reviewed and interpreted the radiographs.     Assessment:   53 y.o. female with right hip pain consistent with labral tearing.  At this time she has tried multiple injections as well as physical therapy and strengthening.  This is not giving her any persistent relief.  She is now also having some glutes symptoms as a result of her hip pain and instability.  Given this I did discuss that ultimately I do believe she be a candidate for right hip arthroscopy with labral repair.  I discussed the risk limitation as well as associated recovery timeframe.  After discussion she would like to proceed with this   Plan :     - Plan for right hip arthroscopy with labral repair    After a lengthy discussion of treatment options, including risks, benefits, alternatives, complications of surgical and nonsurgical conservative options, the patient elected surgical repair.   The patient  is aware of the material risks  and complications including, but not limited to injury to adjacent structures, neurovascular injury, infection, numbness, bleeding, implant failure, thermal burns, stiffness, persistent pain, failure to heal, disease transmission from allograft, need for further surgery, dislocation, anesthetic risks, blood clots, risks of death,and others. The probabilities of surgical success and failure discussed with patient given their particular co-morbidities.The time and nature  of expected rehabilitation and recovery was discussed.The patient's questions were all answered preoperatively.  No barriers to understanding were noted. I explained the natural history of the disease process and Rx rationale.  I explained to the patient what I considered to be reasonable expectations given their personal situation.  The final treatment plan was arrived at through a shared patient decision making process model.     I personally saw and evaluated the patient, and participated in the management and treatment plan.

## 2024-02-01 ENCOUNTER — Ambulatory Visit: Payer: Self-pay | Admitting: Orthopedic Surgery

## 2024-02-04 ENCOUNTER — Ambulatory Visit: Payer: Self-pay | Admitting: Orthopedic Surgery

## 2024-02-08 ENCOUNTER — Ambulatory Visit (INDEPENDENT_AMBULATORY_CARE_PROVIDER_SITE_OTHER): Admitting: Family Medicine

## 2024-02-08 ENCOUNTER — Encounter (INDEPENDENT_AMBULATORY_CARE_PROVIDER_SITE_OTHER): Payer: Self-pay | Admitting: Family Medicine

## 2024-02-08 VITALS — BP 128/82 | HR 68 | Temp 97.9°F | Ht 61.0 in | Wt 166.0 lb

## 2024-02-08 DIAGNOSIS — E559 Vitamin D deficiency, unspecified: Secondary | ICD-10-CM

## 2024-02-08 DIAGNOSIS — E039 Hypothyroidism, unspecified: Secondary | ICD-10-CM

## 2024-02-08 DIAGNOSIS — S76011D Strain of muscle, fascia and tendon of right hip, subsequent encounter: Secondary | ICD-10-CM

## 2024-02-08 DIAGNOSIS — Z6831 Body mass index (BMI) 31.0-31.9, adult: Secondary | ICD-10-CM

## 2024-02-08 DIAGNOSIS — E7849 Other hyperlipidemia: Secondary | ICD-10-CM

## 2024-02-08 DIAGNOSIS — R7303 Prediabetes: Secondary | ICD-10-CM | POA: Diagnosis not present

## 2024-02-08 DIAGNOSIS — F3289 Other specified depressive episodes: Secondary | ICD-10-CM

## 2024-02-08 DIAGNOSIS — E785 Hyperlipidemia, unspecified: Secondary | ICD-10-CM | POA: Diagnosis not present

## 2024-02-08 DIAGNOSIS — E669 Obesity, unspecified: Secondary | ICD-10-CM

## 2024-02-08 DIAGNOSIS — E038 Other specified hypothyroidism: Secondary | ICD-10-CM

## 2024-02-08 DIAGNOSIS — S76012D Strain of muscle, fascia and tendon of left hip, subsequent encounter: Secondary | ICD-10-CM

## 2024-02-08 DIAGNOSIS — S73199S Other sprain of unspecified hip, sequela: Secondary | ICD-10-CM

## 2024-02-08 MED ORDER — METFORMIN HCL 500 MG PO TABS: 500.0000 mg | ORAL_TABLET | Freq: Two times a day (BID) | ORAL | 0 refills | Status: DC

## 2024-02-08 MED ORDER — GEMFIBROZIL 600 MG PO TABS
600.0000 mg | ORAL_TABLET | Freq: Two times a day (BID) | ORAL | 0 refills | Status: DC
Start: 2024-02-08 — End: 2024-03-17

## 2024-02-08 MED ORDER — VITAMIN D (ERGOCALCIFEROL) 1.25 MG (50000 UNIT) PO CAPS: ORAL_CAPSULE | ORAL | 0 refills | Status: DC

## 2024-02-08 MED ORDER — LEVOTHYROXINE SODIUM 75 MCG PO TABS: 75.0000 ug | ORAL_TABLET | Freq: Every day | ORAL | 0 refills | Status: DC

## 2024-02-08 MED ORDER — BUPROPION HCL ER (SR) 150 MG PO TB12: 150.0000 mg | ORAL_TABLET | Freq: Two times a day (BID) | ORAL | 0 refills | Status: DC

## 2024-02-08 MED ORDER — ATORVASTATIN CALCIUM 10 MG PO TABS: 10.0000 mg | ORAL_TABLET | Freq: Every day | ORAL | 0 refills | Status: DC

## 2024-02-08 NOTE — Progress Notes (Signed)
 Office: [phone]  /  Fax: [phone]  WEIGHT SUMMARY AND BIOMETRICS  Anthropometric Measurements Height: 5' 1 (1.549 m) Weight: 166 lb (75.3 kg) BMI (Calculated): 31.38 Weight at Last Visit: 174 lb Weight Lost Since Last Visit: 8 lb Total Weight Loss (lbs): 12 lb (5.443 kg)   Body Composition  Body Fat %: 35.7 % Fat Mass (lbs): 59.4 lbs Muscle Mass (lbs): 101.6 lbs Total Body Water (lbs): 71.8 lbs Visceral Fat Rating : 8   Other Clinical Data Today's Visit #: 35    Chief Complaint: OBESITY   History of Present Illness Betty Porter is a 53 year old female who presents for obesity treatment and progress assessment.  She has been following a 1200 calorie diet with 75 or more grams of protein about 50% of the time. She has lost eight pounds in the last eight weeks. She is not currently exercising due to hip injuries but is undergoing physical therapy, which has helped with the pain. She has not yet started swimming. She sometimes misses doses of her Lipitor, which she takes at night.  She is managing hypolipidemia, vitamin D  deficiency, hypothyroidism, and prediabetes. She requests refills for Lipitor, Synthroid , metformin , and prescription vitamin D . Her sleep is generally good, although she had a poor night's sleep recently after working hard cleaning the garage, which she attributes to being physically warm despite taking a cold shower and setting the [facility] to 70 degrees.  Her family history includes high cholesterol on her father's side, with many relatives having high cholesterol despite not gaining weight easily.  In terms of diet, she has started incorporating protein milkshakes with 30 grams of protein as a meal replacement, particularly for breakfast, after being inspired by a colleague. She finds these helpful in meeting her protein goals without relying solely on meat.      PHYSICAL EXAM:  Blood pressure 128/82, pulse 68, temperature 97.9 F (36.6  C), height 5' 1 (1.549 m), weight 166 lb (75.3 kg), SpO2 100%. Body mass index is 31.37 kg/m.  DIAGNOSTIC DATA REVIEWED:  BMET    Component Value Date/Time   NA 139 12/09/2023 0919   K 4.8 12/09/2023 0919   CL 103 12/09/2023 0919   CO2 21 12/09/2023 0919   GLUCOSE 92 12/09/2023 0919   GLUCOSE 96 09/10/2020 0917   BUN 13 12/09/2023 0919   CREATININE 0.94 12/09/2023 0919   CALCIUM  9.4 12/09/2023 0919   GFRNONAA >60 09/10/2020 0917   Lab Results  Component Value Date   HGBA1C 5.5 12/09/2023   HGBA1C 5.7 (H) 11/06/2020   Lab Results  Component Value Date   INSULIN  15.2 12/09/2023   INSULIN  11.9 11/06/2020   Lab Results  Component Value Date   TSH 1.960 12/09/2023   CBC    Component Value Date/Time   WBC 6.1 11/06/2020 1216   WBC 5.7 09/10/2020 0917   RBC 4.46 11/06/2020 1216   RBC 4.50 09/10/2020 0917   HGB 13.0 11/06/2020 1216   HCT 38.8 11/06/2020 1216   PLT 299 11/06/2020 1216   MCV 87 11/06/2020 1216   MCH 29.1 11/06/2020 1216   MCH 29.3 09/10/2020 0917   MCHC 33.5 11/06/2020 1216   MCHC 33.1 09/10/2020 0917   RDW 13.1 11/06/2020 1216   Iron Studies No results found for: IRON, TIBC, FERRITIN, IRONPCTSAT Lipid Panel     Component Value Date/Time   CHOL 210 (H) 12/09/2023 0919   TRIG 125 12/09/2023 0919   HDL 61 12/09/2023 0919  LDLCALC 127 (H) 12/09/2023 0919   Hepatic Function Panel     Component Value Date/Time   PROT 6.9 12/09/2023 0919   ALBUMIN 4.4 12/09/2023 0919   AST 24 12/09/2023 0919   ALT 26 12/09/2023 0919   ALKPHOS 81 12/09/2023 0919   BILITOT 0.3 12/09/2023 0919      Component Value Date/Time   TSH 1.960 12/09/2023 0919   Nutritional Lab Results  Component Value Date   VD25OH 47.1 12/09/2023   VD25OH 40.6 07/08/2023   VD25OH 37.4 11/19/2022     Assessment and Plan Assessment & Plan Hip Labral Tear Bilateral Labral tears in her hips are causing pain. Physical therapy has provided some relief, but surgery may  be necessary if symptoms persist. There is a possibility of avoiding surgery if physical therapy proves effective. - Continue physical therapy as recommended Continue with weight loss to take pressure off her weight bearing joints - Evaluate need for surgery based on physical therapy outcomes  Obesity She has lost 8 pounds in the last 8 weeks following a modified eating plan. Due to hip injuries, she is not currently exercising but is undergoing physical therapy. Loss of visceral fat is beneficial for her cardiovascular health. Emphasis was placed on maintaining muscle mass, and she was encouraged to start swimming as recommended by her physical therapist. The scale used to measure body composition is highly accurate, using electrical impedance to differentiate between fat, muscle, and water. - Continue current dietary plan with emphasis on protein intake - Encourage initiation of swimming as recommended by physical therapist - Continue physical therapy for hip injuries - Monitor weight and body composition  Hyperlipidemia Her LDL cholesterol has increased, possibly due to genetic factors. She is on a low dose of atorvastatin  and has missed some doses. Increasing the dose was discussed if LDL levels remain elevated. Her family history includes high cholesterol, which may contribute to her condition. - Refill atorvastatin  prescription - Recheck lipid panel in August - Consider increasing atorvastatin  dose if LDL remains elevated - Continue diet exercise and weight loss  Prediabetes Prediabetes is well controlled with metformin , diet, and exercise. Hemoglobin A1c is 5.5, and improved insulin  levels indicate better pancreatic function. - Refill metformin  prescription - Continue diet exercise and weight loss  Hypothyroidism Hypothyroidism is well-managed on Synthroid , with recent lab results within normal limits. - Refill Synthroid  prescription  Vitamin D  Deficiency Vitamin D  levels have  improved and are close to the target range. Continued supplementation is encouraged. - Refill vitamin D  prescription   She was informed of the importance of frequent follow up visits to maximize her success with intensive lifestyle modifications for her multiple health conditions.    [name], MD

## 2024-02-10 ENCOUNTER — Encounter (HOSPITAL_BASED_OUTPATIENT_CLINIC_OR_DEPARTMENT_OTHER): Payer: Self-pay

## 2024-02-10 ENCOUNTER — Ambulatory Visit: Payer: Self-pay | Admitting: Orthopaedic Surgery

## 2024-02-14 ENCOUNTER — Other Ambulatory Visit (HOSPITAL_BASED_OUTPATIENT_CLINIC_OR_DEPARTMENT_OTHER): Payer: Self-pay | Admitting: Orthopaedic Surgery

## 2024-02-29 ENCOUNTER — Encounter (HOSPITAL_BASED_OUTPATIENT_CLINIC_OR_DEPARTMENT_OTHER): Payer: Self-pay | Admitting: Orthopaedic Surgery

## 2024-03-02 ENCOUNTER — Encounter (HOSPITAL_BASED_OUTPATIENT_CLINIC_OR_DEPARTMENT_OTHER): Payer: Self-pay | Admitting: Orthopaedic Surgery

## 2024-03-02 ENCOUNTER — Other Ambulatory Visit: Payer: Self-pay

## 2024-03-03 ENCOUNTER — Encounter (HOSPITAL_BASED_OUTPATIENT_CLINIC_OR_DEPARTMENT_OTHER)
Admission: RE | Admit: 2024-03-03 | Discharge: 2024-03-03 | Disposition: A | Discharge status: Home / Self Care | Source: Ambulatory Visit | Attending: Orthopaedic Surgery | Admitting: Orthopaedic Surgery

## 2024-03-03 DIAGNOSIS — R7303 Prediabetes: Secondary | ICD-10-CM | POA: Insufficient documentation

## 2024-03-03 DIAGNOSIS — Z01818 Encounter for other preprocedural examination: Secondary | ICD-10-CM | POA: Insufficient documentation

## 2024-03-03 LAB — BASIC METABOLIC PANEL WITH GFR
Anion gap: 7 (ref 5–15)
BUN: 20 mg/dL (ref 6–20)
CO2: 24 mmol/L (ref 22–32)
Calcium: 9.9 mg/dL (ref 8.9–10.3)
Chloride: 104 mmol/L (ref 98–111)
Creatinine, Ser: 1.13 mg/dL — ABNORMAL HIGH (ref 0.44–1.00)
GFR, Estimated: 59 mL/min — ABNORMAL LOW (ref >60)
Glucose, Bld: 95 mg/dL (ref 70–99)
Potassium: 4 mmol/L (ref 3.5–5.1)
Sodium: 135 mmol/L (ref 135–145)

## 2024-03-03 NOTE — Progress Notes (Signed)

## 2024-03-07 ENCOUNTER — Encounter (HOSPITAL_BASED_OUTPATIENT_CLINIC_OR_DEPARTMENT_OTHER): Payer: Self-pay | Admitting: Orthopaedic Surgery

## 2024-03-07 ENCOUNTER — Ambulatory Visit (HOSPITAL_BASED_OUTPATIENT_CLINIC_OR_DEPARTMENT_OTHER): Admitting: Anesthesiology

## 2024-03-07 ENCOUNTER — Ambulatory Visit (HOSPITAL_BASED_OUTPATIENT_CLINIC_OR_DEPARTMENT_OTHER)
Admission: RE | Admit: 2024-03-07 | Discharge: 2024-03-07 | Disposition: A | Discharge status: Home / Self Care | Attending: Orthopaedic Surgery | Admitting: Orthopaedic Surgery

## 2024-03-07 ENCOUNTER — Encounter (HOSPITAL_BASED_OUTPATIENT_CLINIC_OR_DEPARTMENT_OTHER)
Admission: RE | Disposition: A | Discharge status: Home / Self Care | Payer: Self-pay | Source: Home / Self Care | Attending: Orthopaedic Surgery

## 2024-03-07 ENCOUNTER — Other Ambulatory Visit: Payer: Self-pay

## 2024-03-07 ENCOUNTER — Ambulatory Visit (HOSPITAL_COMMUNITY)

## 2024-03-07 DIAGNOSIS — S73191D Other sprain of right hip, subsequent encounter: Secondary | ICD-10-CM

## 2024-03-07 DIAGNOSIS — X58XXXA Exposure to other specified factors, initial encounter: Secondary | ICD-10-CM | POA: Diagnosis not present

## 2024-03-07 DIAGNOSIS — K219 Gastro-esophageal reflux disease without esophagitis: Secondary | ICD-10-CM | POA: Diagnosis not present

## 2024-03-07 DIAGNOSIS — E039 Hypothyroidism, unspecified: Secondary | ICD-10-CM | POA: Insufficient documentation

## 2024-03-07 DIAGNOSIS — E88819 Insulin resistance, unspecified: Secondary | ICD-10-CM

## 2024-03-07 DIAGNOSIS — E669 Obesity, unspecified: Secondary | ICD-10-CM | POA: Insufficient documentation

## 2024-03-07 DIAGNOSIS — Z87891 Personal history of nicotine dependence: Secondary | ICD-10-CM | POA: Diagnosis not present

## 2024-03-07 DIAGNOSIS — Z6831 Body mass index (BMI) 31.0-31.9, adult: Secondary | ICD-10-CM | POA: Insufficient documentation

## 2024-03-07 DIAGNOSIS — S73191A Other sprain of right hip, initial encounter: Secondary | ICD-10-CM

## 2024-03-07 DIAGNOSIS — R7303 Prediabetes: Secondary | ICD-10-CM

## 2024-03-07 HISTORY — DX: Prediabetes: R73.03

## 2024-03-07 LAB — GLUCOSE, CAPILLARY
Glucose-Capillary: 88 mg/dL (ref 70–99)
Glucose-Capillary: 102 mg/dL — ABNORMAL HIGH (ref 70–99)

## 2024-03-07 LAB — POCT PREGNANCY, URINE: Preg Test, Ur: NEGATIVE

## 2024-03-07 SURGERY — ARTHROSCOPY, HIP, WITH LABRUM REPAIR
Anesthesia: General | Site: Hip | Laterality: Right

## 2024-03-07 MED ORDER — ROCURONIUM BROMIDE 100 MG/10ML IV SOLN
INTRAVENOUS | Status: DC | PRN
  Administered 2024-03-07: 60 mg via INTRAVENOUS

## 2024-03-07 MED ORDER — SCOPOLAMINE 1 MG/3DAYS TD PT72
MEDICATED_PATCH | TRANSDERMAL | Status: AC
  Filled 2024-03-07: qty 1

## 2024-03-07 MED ORDER — ACETAMINOPHEN 500 MG PO TABS
ORAL_TABLET | ORAL | Status: AC
  Filled 2024-03-07: qty 2

## 2024-03-07 MED ORDER — LACTATED RINGERS IV SOLN: INTRAVENOUS | Status: DC

## 2024-03-07 MED ORDER — ONDANSETRON HCL 4 MG/2ML IJ SOLN
INTRAMUSCULAR | Status: DC | PRN
  Administered 2024-03-07: 4 mg via INTRAVENOUS

## 2024-03-07 MED ORDER — PROPOFOL 10 MG/ML IV BOLUS
INTRAVENOUS | Status: DC | PRN
  Administered 2024-03-07: 170 mg via INTRAVENOUS

## 2024-03-07 MED ORDER — CEFAZOLIN SODIUM-DEXTROSE 2-4 GM/100ML-% IV SOLN
2.0000 g | INTRAVENOUS | Status: AC
  Administered 2024-03-07: 2 g via INTRAVENOUS

## 2024-03-07 MED ORDER — DEXAMETHASONE SODIUM PHOSPHATE 10 MG/ML IJ SOLN
INTRAMUSCULAR | Status: AC
  Filled 2024-03-07: qty 1

## 2024-03-07 MED ORDER — SODIUM CHLORIDE 0.9 % IR SOLN
Status: DC | PRN
  Administered 2024-03-07: 3000 mL

## 2024-03-07 MED ORDER — FENTANYL CITRATE (PF) 100 MCG/2ML IJ SOLN
INTRAMUSCULAR | Status: AC
  Filled 2024-03-07: qty 2

## 2024-03-07 MED ORDER — ROCURONIUM BROMIDE 10 MG/ML (PF) SYRINGE
PREFILLED_SYRINGE | INTRAVENOUS | Status: AC
  Filled 2024-03-07: qty 10

## 2024-03-07 MED ORDER — BUPIVACAINE HCL 0.25 % IJ SOLN
INTRAMUSCULAR | Status: DC | PRN
Start: 2024-03-07 — End: 2024-03-07
  Administered 2024-03-07: 20 mL

## 2024-03-07 MED ORDER — FENTANYL CITRATE (PF) 100 MCG/2ML IJ SOLN
INTRAMUSCULAR | Status: DC | PRN
  Administered 2024-03-07 (×3): 50 ug via INTRAVENOUS

## 2024-03-07 MED ORDER — AMISULPRIDE (ANTIEMETIC) 5 MG/2ML IV SOLN
10.0000 mg | Freq: Once | INTRAVENOUS | Status: AC | PRN
  Administered 2024-03-07: 10 mg via INTRAVENOUS

## 2024-03-07 MED ORDER — FENTANYL CITRATE (PF) 100 MCG/2ML IJ SOLN
INTRAMUSCULAR | Status: AC
Start: 2024-03-07 — End: 2024-03-07
  Filled 2024-03-07: qty 2

## 2024-03-07 MED ORDER — LIDOCAINE HCL (CARDIAC) PF 100 MG/5ML IV SOSY
PREFILLED_SYRINGE | INTRAVENOUS | Status: DC | PRN
  Administered 2024-03-07: 80 mg via INTRAVENOUS

## 2024-03-07 MED ORDER — AMISULPRIDE (ANTIEMETIC) 5 MG/2ML IV SOLN
INTRAVENOUS | Status: AC
Start: 2024-03-07 — End: 2024-03-07
  Filled 2024-03-07: qty 4

## 2024-03-07 MED ORDER — FENTANYL CITRATE (PF) 100 MCG/2ML IJ SOLN
25.0000 ug | INTRAMUSCULAR | Status: DC | PRN
  Administered 2024-03-07: 25 ug via INTRAVENOUS
  Administered 2024-03-07 (×2): 50 ug via INTRAVENOUS

## 2024-03-07 MED ORDER — ONDANSETRON HCL 4 MG/2ML IJ SOLN: 4.0000 mg | Freq: Once | INTRAMUSCULAR | Status: DC | PRN

## 2024-03-07 MED ORDER — PROPOFOL 10 MG/ML IV BOLUS
INTRAVENOUS | Status: AC
  Filled 2024-03-07: qty 20

## 2024-03-07 MED ORDER — SUGAMMADEX SODIUM 200 MG/2ML IV SOLN
INTRAVENOUS | Status: DC | PRN
Start: 2024-03-07 — End: 2024-03-07
  Administered 2024-03-07: 200 mg via INTRAVENOUS

## 2024-03-07 MED ORDER — MIDAZOLAM HCL 2 MG/2ML IJ SOLN
INTRAMUSCULAR | Status: AC
  Filled 2024-03-07: qty 2

## 2024-03-07 MED ORDER — TRANEXAMIC ACID-NACL 1000-0.7 MG/100ML-% IV SOLN
1000.0000 mg | INTRAVENOUS | Status: AC
  Administered 2024-03-07: 1000 mg via INTRAVENOUS

## 2024-03-07 MED ORDER — SCOPOLAMINE 1 MG/3DAYS TD PT72
1.0000 | MEDICATED_PATCH | Freq: Once | TRANSDERMAL | Status: DC
  Administered 2024-03-07: 1.5 mg via TRANSDERMAL

## 2024-03-07 MED ORDER — ONDANSETRON HCL 4 MG/2ML IJ SOLN
INTRAMUSCULAR | Status: AC
  Filled 2024-03-07: qty 2

## 2024-03-07 MED ORDER — GABAPENTIN 300 MG PO CAPS
300.0000 mg | ORAL_CAPSULE | Freq: Once | ORAL | Status: AC
  Administered 2024-03-07: 300 mg via ORAL

## 2024-03-07 MED ORDER — CEFAZOLIN SODIUM-DEXTROSE 2-4 GM/100ML-% IV SOLN
INTRAVENOUS | Status: AC
  Filled 2024-03-07: qty 100

## 2024-03-07 MED ORDER — GABAPENTIN 300 MG PO CAPS
ORAL_CAPSULE | ORAL | Status: AC
Start: 2024-03-07 — End: 2024-03-07
  Filled 2024-03-07: qty 1

## 2024-03-07 MED ORDER — DEXAMETHASONE SODIUM PHOSPHATE 10 MG/ML IJ SOLN
INTRAMUSCULAR | Status: DC | PRN
Start: 2024-03-07 — End: 2024-03-07
  Administered 2024-03-07: 10 mg via INTRAVENOUS

## 2024-03-07 MED ORDER — MIDAZOLAM HCL 5 MG/5ML IJ SOLN
INTRAMUSCULAR | Status: DC | PRN
  Administered 2024-03-07: 2 mg via INTRAVENOUS

## 2024-03-07 MED ORDER — TRANEXAMIC ACID-NACL 1000-0.7 MG/100ML-% IV SOLN
INTRAVENOUS | Status: AC
Start: 2024-03-07 — End: 2024-03-07
  Filled 2024-03-07: qty 100

## 2024-03-07 MED ORDER — ACETAMINOPHEN 500 MG PO TABS
1000.0000 mg | ORAL_TABLET | Freq: Once | ORAL | Status: AC
  Administered 2024-03-07: 1000 mg via ORAL

## 2024-03-07 MED ORDER — LIDOCAINE 2% (20 MG/ML) 5 ML SYRINGE
INTRAMUSCULAR | Status: AC
  Filled 2024-03-07: qty 5

## 2024-03-07 SURGICAL SUPPLY — 43 items
ANCHOR SUT 1.4 FLEX (Anchor) IMPLANT
BIT DRILL FLEX NANOTACK (BIT) IMPLANT
BLADE SAMURAI STR FULL RADIUS (BLADE) IMPLANT
BLADE SURG 11 STRL SS (BLADE) ×1 IMPLANT
CANNULA OBTURATOR FLOWPORT ST5 (CANNULA) IMPLANT
CHLORAPREP W/TINT 26 (MISCELLANEOUS) ×1 IMPLANT
COOLER ICEMAN CLASSIC (MISCELLANEOUS) ×1 IMPLANT
COVER BACK TABLE 60X90IN (DRAPES) ×1 IMPLANT
COVER MAYO STAND STRL (DRAPES) ×2 IMPLANT
DISSECTOR 4.2MMX19CM HL (MISCELLANEOUS) ×1 IMPLANT
DRAPE C-ARM 42X72 X-RAY (DRAPES) ×1 IMPLANT
DRAPE U-SHAPE 47X51 STRL (DRAPES) ×2 IMPLANT
DRSG TEGADERM 4X10 (GAUZE/BANDAGES/DRESSINGS) IMPLANT
DRSG TEGADERM 4X4.75 (GAUZE/BANDAGES/DRESSINGS) ×3 IMPLANT
FEE RENTAL EQUIP HIP INSTR KIT (INSTRUMENTS) IMPLANT
GAUZE SPONGE 4X4 12PLY STRL (GAUZE/BANDAGES/DRESSINGS) ×1 IMPLANT
GAUZE XEROFORM 1X8 LF (GAUZE/BANDAGES/DRESSINGS) ×1 IMPLANT
GLOVE BIO SURGEON STRL SZ 6 (GLOVE) ×2 IMPLANT
GLOVE BIO SURGEON STRL SZ7.5 (GLOVE) ×2 IMPLANT
GLOVE BIOGEL PI IND STRL 6.5 (GLOVE) ×1 IMPLANT
GLOVE BIOGEL PI IND STRL 8 (GLOVE) ×1 IMPLANT
GOWN STRL REUS W/ TWL LRG LVL3 (GOWN DISPOSABLE) ×2 IMPLANT
GOWN STRL REUS W/TWL XL LVL3 (GOWN DISPOSABLE) ×1 IMPLANT
INSTRUMENT ORTHO TEXT HIP FEM (INSTRUMENTS) IMPLANT
KIT PATIENT POSITION MEDIUM (KITS) IMPLANT
KIT PORTAL ENTRY HIP ACCESS (KITS) IMPLANT
MANIFOLD NEPTUNE II (INSTRUMENTS) ×1 IMPLANT
NDL INJECTOR II CARTRIDGE (MISCELLANEOUS) IMPLANT
NDL SPNL 18GX3.5 QUINCKE PK (NEEDLE) ×1 IMPLANT
NEEDLE INJECTOR II CARTRIDGE (MISCELLANEOUS) ×1 IMPLANT
NEEDLE SPNL 18GX3.5 QUINCKE PK (NEEDLE) ×1 IMPLANT
PACK BASIN DAY SURGERY FS (CUSTOM PROCEDURE TRAY) ×1 IMPLANT
PAD COLD SHLDR WRAP-ON (PAD) ×1 IMPLANT
PASSER SUT 1.5D CRESCENT (INSTRUMENTS) IMPLANT
SUT ETHILON 3 0 PS 1 (SUTURE) ×1 IMPLANT
SUT XBRAID 1.4 BLUE/BLACK (SUTURE) IMPLANT
SYR 50ML LL SCALE MARK (SYRINGE) ×1 IMPLANT
TOWEL GREEN STERILE FF (TOWEL DISPOSABLE) ×2 IMPLANT
TRAY ARTHROSCOPY HIP STRE FEE (INSTRUMENTS) ×1 IMPLANT
TRAY PIVOT PORT STRE FEE (INSTRUMENTS) ×1 IMPLANT
TUBE CONNECTING 20X1/4 (TUBING) ×3 IMPLANT
TUBING ARTHROSCOPY IRRIG 16FT (MISCELLANEOUS) ×1 IMPLANT
WAND APOLLO RF 50D ABLATOR (BUR) ×1 IMPLANT

## 2024-03-07 NOTE — Anesthesia Postprocedure Evaluation (Signed)
 Anesthesia Post Note  Patient: Betty Porter  Procedure(s) Performed: RIGHT HIP ARTHROSCOPY WITH LABRUM REPAIR (Right: Hip)     Patient location during evaluation: PACU Anesthesia Type: General Level of consciousness: awake and alert Pain management: pain level controlled Vital Signs Assessment: post-procedure vital signs reviewed and stable Respiratory status: spontaneous breathing, nonlabored ventilation and respiratory function stable Cardiovascular status: blood pressure returned to baseline and stable Postop Assessment: no apparent nausea or vomiting Anesthetic complications: no   No notable events documented.  Last Vitals:  Vitals:   03/07/24 1415 03/07/24 1423  BP: 124/75   Pulse: 74 76  Resp: (!) 3 (!) 8  Temp:    SpO2: 99% 98%    Last Pain:  Vitals:   03/07/24 1423  TempSrc:   PainSc: 6                  [name]

## 2024-03-07 NOTE — H&P (Signed)
 Chief Complaint: Right hip pain        History of Present Illness:    01/27/2024: Follow-up for right hip pain.  She is here today for MRI discussion.  She is still having persistent femoral acetabular type pain Betty Porter is a 53 y.o. female here today for evaluation of right hip pain.  This began over a year ago without any known injury.  Pain is located in the lateral hip and occasionally moves more toward the groin.  Rates pain as moderate to severe and this is worsened with stairs and long periods of sitting or standing.  She has been put on a course of oral steroids for this which only helped temporarily and her pain soon returned.  Had x-rays taken on 11/11 and family medicine.  Denies any other medications or treatments.     Surgical History:   None   PMH/PSH/Family History/Social History/Meds/Allergies:         Past Medical History:  Diagnosis Date   Acne     Adnexal mass      complex   Allergy 2010    Seasonal   Fibroid, uterine 2008   GERD (gastroesophageal reflux disease)     Hand swelling     Hyperlipidemia     Hypothyroidism      followed by pcp   Joint pain     Lactose intolerance     Lower extremity edema     PONV (postoperative nausea and vomiting)     SUI (stress urinary incontinence, female)               Past Surgical History:  Procedure Laterality Date   ABDOMINAL HYSTERECTOMY   2022   CESAREAN SECTION   2009   LAPAROSCOPY   2008    for infertility   TONSILLECTOMY   1977   XI ROBOTIC ASSISTED OOPHORECTOMY N/A 09/12/2020    Procedure: XI ROBOTIC ASSISTED BILATERAL SALPINGECTOMY AND LEFT OOPHORECTOMY;  Surgeon: Suzi Essex, MD;  Location: [facility] [place];  Service: Gynecology;  Laterality: N/A;        Social History         Socioeconomic History   Marital status: Widowed      Spouse name: Not on file   Number of children: Not on file   Years of education: Not on file   Highest  education level: Associate degree: occupational, Scientist, product/process development, or vocational program  Occupational History   Occupation: Chiropractor  Tobacco Use   Smoking status: Former      Current packs/day: 0.00      Average packs/day: 0.8 packs/day for 10.0 years (7.5 ttl pk-yrs)      Types: Cigarettes      Start date: 08/11/1986      Quit date: 08/11/1996      Years since quitting: 26.9   Smokeless tobacco: Never  Vaping Use   Vaping status: Never Used  Substance and Sexual Activity   Alcohol use: Not Currently      Comment: socially    Drug use: Never   Sexual activity: Not Currently      Birth control/protection: None  Other Topics Concern   Not on file  Social History Narrative   Not on file    Social Determinants of Health  Financial Resource Strain: Low Risk  (06/18/2023)    Overall Financial Resource Strain (CARDIA)     Difficulty of Paying Living Expenses: Not hard at all  Food Insecurity: No Food Insecurity (06/18/2023)    Hunger Vital Sign     Worried About Running Out of Food in the Last Year: Never true     Ran Out of Food in the Last Year: Never true  Transportation Needs: No Transportation Needs (06/18/2023)    PRAPARE - Therapist, art (Medical): No     Lack of Transportation (Non-Medical): No  Physical Activity: Sufficiently Active (06/18/2023)    Exercise Vital Sign     Days of Exercise per Week: 3 days     Minutes of Exercise per Session: 60 min  Stress: No Stress Concern Present (06/18/2023)    [employer] of Occupational Health - Occupational Stress Questionnaire     Feeling of Stress : Only a little  Social Connections: Socially Isolated (06/18/2023)    Social Connection and Isolation Panel [NHANES]     Frequency of Communication with Friends and Family: Three times a week     Frequency of Social Gatherings with Friends and Family: Never     Attends Religious Services: Never     Database administrator or Organizations: No      Attends Engineer, structural: Not on file     Marital Status: Widowed         Family History  Problem Relation Age of Onset   Breast cancer Maternal Aunt     Cancer Maternal Aunt     Hypertension Mother     Osteoporosis Mother     Arthritis Mother     Diabetes Father     Hyperlipidemia Father     Obesity Father     Thyroid  cancer Maternal Aunt     Leukemia Maternal Uncle     Diabetes Paternal Grandfather     Cancer Maternal Uncle     Colon cancer Neg Hx     Ovarian cancer Neg Hx     Endometrial cancer Neg Hx     Pancreatic cancer Neg Hx     Prostate cancer Neg Hx          Allergies  No Known Allergies         Current Outpatient Medications  Medication Sig Dispense Refill   atorvastatin  (LIPITOR) 10 MG tablet Take 1 tablet (10 mg total) by mouth at bedtime. 90 tablet 0   buPROPion  (WELLBUTRIN  SR) 150 MG 12 hr tablet Take 1 tablet (150 mg total) by mouth 2 (two) times daily. 180 tablet 0   gemfibrozil  (LOPID ) 600 MG tablet Take 1 tablet (600 mg total) by mouth in the morning and at bedtime. 90 tablet 0   levothyroxine  (SYNTHROID ) 75 MCG tablet Take 1 tablet (75 mcg total) by mouth daily before breakfast. 90 tablet 0   metFORMIN  (GLUCOPHAGE ) 500 MG tablet Take 1 tablet (500 mg total) by mouth 2 (two) times daily with a meal. 180 tablet 0   predniSONE  (STERAPRED UNI-PAK 21 TAB) 10 MG (21) TBPK tablet Use as directed. 21 each 0   Vitamin D , Ergocalciferol , (DRISDOL ) 1.25 MG (50000 UNIT) CAPS capsule TAKE ONE CAPSULE BY MOUTH ONCE WEEKLY (EVERY 7 DAYS) 13 capsule 0      No current facility-administered medications for this visit.      Imaging Results (Last 48 hours)  No results found.  Review of Systems:   A ROS was performed including pertinent positives and negatives as documented in the HPI.   Physical Exam :   Constitutional: NAD and appears stated age Neurological: Alert and oriented Psych: Appropriate affect and cooperative Last menstrual period  06/07/2023.    Comprehensive Musculoskeletal Exam:     Right hip exam demonstrates pinpoint tenderness directly over the greater trochanter.  Passive hip range of motion to 120 degrees flexion and 30 degrees of internal and external rotation.  Negative FABER and FADIR.  5/5 strength with resisted abduction.   Imaging:   Xray review from 06/22/2023 (AP pelvis, right hip 2 views): No evidence of acute abnormality or degenerative changes at the hip joint   MRI right hip: Full-thickness anterior superior labral tear   I personally reviewed and interpreted the radiographs.     Assessment:   53 y.o. female with right hip pain consistent with labral tearing.  At this time she has tried multiple injections as well as physical therapy and strengthening.  This is not giving her any persistent relief.  She is now also having some glutes symptoms as a result of her hip pain and instability.  Given this I did discuss that ultimately I do believe she be a candidate for right hip arthroscopy with labral repair.  I discussed the risk limitation as well as associated recovery timeframe.  After discussion she would like to proceed with this   Plan :     - Plan for right hip arthroscopy with labral repair    After a lengthy discussion of treatment options, including risks, benefits, alternatives, complications of surgical and nonsurgical conservative options, the patient elected surgical repair.   The patient  is aware of the material risks  and complications including, but not limited to injury to adjacent structures, neurovascular injury, infection, numbness, bleeding, implant failure, thermal burns, stiffness, persistent pain, failure to heal, disease transmission from allograft, need for further surgery, dislocation, anesthetic risks, blood clots, risks of death,and others. The probabilities of surgical success and failure discussed with patient given their particular co-morbidities.The time and nature  of expected rehabilitation and recovery was discussed.The patient's questions were all answered preoperatively.  No barriers to understanding were noted. I explained the natural history of the disease process and Rx rationale.  I explained to the patient what I considered to be reasonable expectations given their personal situation.  The final treatment plan was arrived at through a shared patient decision making process model.     I personally saw and evaluated the patient, and participated in the management and treatment plan.

## 2024-03-07 NOTE — Discharge Instructions (Addendum)
 Discharge Instructions    Attending Surgeon: Elspeth Parker, MD Office Phone Number: [phone]   Diagnosis and Procedures:    Surgeries Performed: Right hip labral repair  Discharge Plan:    Diet: Resume usual diet. Begin with light or bland foods.  Drink plenty of fluids.  Activity:  Weightbearing as tolerated right leg with crutches. You are advised to go home directly from the hospital or surgical center. Restrict your activities.  GENERAL INSTRUCTIONS: 1.  Please apply ice to your wound to help with swelling and inflammation. This will improve your comfort and your overall recovery following surgery.     2. Please call Dr. Danetta office at [phone] with questions Monday-Friday during business hours. If no one answers, please leave a message and someone should get back to the patient within 24 hours. For emergencies please call 911 or proceed to the emergency room.   3. Patient to notify surgical team if experiences any of the following: Bowel/Bladder dysfunction, uncontrolled pain, nerve/muscle weakness, incision with increased drainage or redness, nausea/vomiting and Fever greater than 101.0 F.  Be alert for signs of infection including redness, streaking, odor, fever or chills. Be alert for excessive pain or bleeding and notify your surgeon immediately.  WOUND INSTRUCTIONS:   Leave your dressing, cast, or splint in place until your post operative visit.  Keep it clean and dry.  Always keep the incision clean and dry until the staples/sutures are removed. If there is no drainage from the incision you should keep it open to air. If there is drainage from the incision you must keep it covered at all times until the drainage stops  Do not soak in a bath tub, hot tub, pool, lake or other body of water until 21 days after your surgery and your incision is completely dry and healed.  If you have removable sutures (or staples) they must be removed 10-14 days (unless  otherwise instructed) from the day of your surgery.     1)  Elevate the extremity as much as possible.  2)  Keep the dressing clean and dry.  3)  Please call us  if the dressing becomes wet or dirty.  4)  If you are experiencing worsening pain or worsening swelling, please call.     MEDICATIONS: Resume all previous home medications at the previous prescribed dose and frequency unless otherwise noted Start taking the  pain medications on an as-needed basis as prescribed  Please taper down pain medication over the next week following surgery.  Ideally you should not require a refill of any narcotic pain medication.  Take pain medication with food to minimize nausea. In addition to the prescribed pain medication, you may take over-the-counter pain relievers such as Tylenol .  Do NOT take additional tylenol  if your pain medication already has tylenol  in it.  Aspirin  325mg  daily per instructions on bottle. Narcotic policy: Per [facility] clinic policy, our goal is ensure optimal postoperative pain control with a multimodal pain management strategy. For all [facility] patients, our goal is to wean post-operative narcotic medications by 6 weeks post-operatively, and many times sooner. If this is not possible due to utilization of pain medication prior to surgery, your [facility] doctor will support your acute post-operative pain control for the first 6 weeks postoperatively, with a plan to transition you back to your primary pain team following that. Betty Porter will work to ensure a Therapist, occupational.       FOLLOWUP INSTRUCTIONS: 1. Follow up at the Physical  Therapy Clinic 3-4 days following surgery. This appointment should be scheduled unless other arrangements have been made.The Physical Therapy scheduling number is [phone] if an appointment has not already been arranged.  2. Contact Dr. Danetta office during office hours at [phone] or the practice after hours line at [phone] for  non-emergencies. For medical emergencies call 911.   Discharge Location: Home    Post Anesthesia Home Care Instructions  Activity: Get plenty of rest for the remainder of the day. A responsible individual must stay with you for 24 hours following the procedure.  For the next 24 hours, DO NOT: -Drive a car -Advertising copywriter -Drink alcoholic beverages -Take any medication unless instructed by your physician -Make any legal decisions or sign important papers.  Meals: Start with liquid foods such as gelatin or soup. Progress to regular foods as tolerated. Avoid greasy, spicy, heavy foods. If nausea and/or vomiting occur, drink only clear liquids until the nausea and/or vomiting subsides. Call your physician if vomiting continues.  Special Instructions/Symptoms: Your throat may feel dry or sore from the anesthesia or the breathing tube placed in your throat during surgery. If this causes discomfort, gargle with warm salt water. The discomfort should disappear within 24 hours.  If you had a scopolamine  patch placed behind your ear for the management of post- operative nausea and/or vomiting:  1. The medication in the patch is effective for 72 hours, after which it should be removed.  Wrap patch in a tissue and discard in the trash. Wash hands thoroughly with soap and water. 2. You may remove the patch earlier than 72 hours if you experience unpleasant side effects which may include dry mouth, dizziness or visual disturbances. 3. Avoid touching the patch. Wash your hands with soap and water after contact with the patch.  No tylenol  until after 4:45pm today.

## 2024-03-07 NOTE — Brief Op Note (Signed)
   Brief Op Note  Date of Surgery: 03/07/2024  Preoperative Diagnosis: RIGHT HIP LABRAL TEAR  Postoperative Diagnosis: same  Procedure: Procedure(s): RIGHT HIP ARTHROSCOPY WITH LABRUM REPAIR  Implants: Implant Name Type Inv. Item Serial No. Manufacturer Lot No. LRB No. Used Action  ANCHOR SUT 1.4 FLEX - [id number] Anchor ANCHOR SUT 1.4 FLEX  [employer] [id number] Right 3 Implanted    Surgeons: Surgeon(s): Betty Standing, MD  Anesthesia: General    Estimated Blood Loss: See anesthesia record  Complications: None  Condition to PACU: Stable  Porter LITTIE Genelle, MD 03/07/2024 1:39 PM

## 2024-03-07 NOTE — Anesthesia Procedure Notes (Signed)
 Procedure Name: Intubation Date/Time: 03/07/2024 12:18 PM  Performed by: Frost Kayla MATSU, CRNAPre-anesthesia Checklist: Patient identified, Emergency Drugs available, Suction available and Patient being monitored Patient Re-evaluated:Patient Re-evaluated prior to induction Oxygen Delivery Method: Circle system utilized Preoxygenation: Pre-oxygenation with 100% oxygen Induction Type: IV induction Ventilation: Mask ventilation without difficulty Laryngoscope Size: Miller and 3 Grade View: Grade II Tube type: Oral Tube size: 7.0 mm Number of attempts: 1 Airway Equipment and Method: Stylet and Oral airway Placement Confirmation: ETT inserted through vocal cords under direct vision, positive ETCO2 and breath sounds checked- equal and bilateral Secured at: 21 cm Tube secured with: Tape Dental Injury: Teeth and Oropharynx as per pre-operative assessment

## 2024-03-07 NOTE — Transfer of Care (Signed)
 Immediate Anesthesia Transfer of Care Note  Patient: Betty Porter  Procedure(s) Performed: RIGHT HIP ARTHROSCOPY WITH LABRUM REPAIR (Right: Hip)  Patient Location: PACU  Anesthesia Type:General  Level of Consciousness: drowsy and patient cooperative  Airway & Oxygen Therapy: Patient Spontanous Breathing and Patient connected to face mask oxygen  Post-op Assessment: Report given to RN and Post -op Vital signs reviewed and stable  Post vital signs: Reviewed and stable  Last Vitals:  Vitals Value Taken Time  BP    Temp    Pulse 90 03/07/24 13:31  Resp    SpO2 96 % 03/07/24 13:31  Vitals shown include unfiled device data.  Last Pain:  Vitals:   03/07/24 1041  TempSrc: Temporal  PainSc: 1       Patients Stated Pain Goal: 3 (03/07/24 1041)  Complications: No notable events documented.

## 2024-03-07 NOTE — Interval H&P Note (Signed)
 History and Physical Interval Note:  03/07/2024 11:28 AM  Betty Porter  has presented today for surgery, with the diagnosis of RIGHT HIP LABRAL TEAR.  The various methods of treatment have been discussed with the patient and family. After consideration of risks, benefits and other options for treatment, the patient has consented to  Procedure(s): ARTHROSCOPY, HIP, WITH LABRUM REPAIR (Right) as a surgical intervention.  The patient's history has been reviewed, patient examined, no change in status, stable for surgery.  I have reviewed the patient's chart and labs.  Questions were answered to the patient's satisfaction.     Breyer Tejera

## 2024-03-07 NOTE — Op Note (Signed)
 Date of Surgery: 03/07/2024  INDICATIONS: Ms. Hanahan is a 53 y.o.-year-old female with right hip labral tear.  The risk and benefits of the procedure were discussed in detail and documented in the pre-operative evaluation.   PREOPERATIVE DIAGNOSIS: 1.  Right hip labral tear  POSTOPERATIVE DIAGNOSIS: Same.  PROCEDURE: 1.  Right hip labral repair  SURGEON: Elspeth LITTIE Parker MD  ASSISTANT: Conley Dawson, ATC  ANESTHESIA:  general  IV FLUIDS AND URINE: See anesthesia record.  ANTIBIOTICS: Ancef   ESTIMATED BLOOD LOSS: 10 mL.  IMPLANTS:  Implant Name Type Inv. Item Serial No. Manufacturer Lot No. LRB No. Used Action  ANCHOR SUT 1.4 FLEX - [id number] Anchor ANCHOR SUT 1.4 FLEX  [employer] [id number] Right 3 Implanted    DRAINS: None  CULTURES: None  COMPLICATIONS: none  DESCRIPTION OF PROCEDURE:  Cartilage Intact femoral cartilage, positive rug sign with flap of cartilage anterior/superior   Labrum Frayed/torn appearing   Boundaries of labral tear Convention (3 o'clock anterior, 9 o'clock posterior) Anterior boundary: 3 o'clock Posterior boundary: 1 o'clock   OPERATIVE REPORT:  The patient was brought to the operating room, placed supine on the operating table, and bony prominences were padded.  The traction boots were applied with padding to ensure that safe traction could be applied through the feet.  The contralateral limb was abducted maximally and light traction was applied.  The operative leg was brought into neutral position.  The flouroscopic c-arm was brought between the legs for an AP image.  The patient was prepped and draped in a sterile fashion.  Time-out was performed and landmarks were identified. Traction was obtained and care was taken to ensure the least amount of force necessary to allow safe access to the joint of 8-3mm.  This was checked with fluoroscopy.    Next we placed an anterolateral portal under the assistance of fluoroscopy.  First,  fluoroscopy was used to estimate the trajectory and starting point.  A 5mm incision with a #11 blade was made and a straight hemostat was used to dilate the portal through the appropriate tract.  We then placed a 14-gauge hypodermic needle with careful technique to be as close to the femoral head as possible and parallel to the sorcele to ensure no iatrogenic damage to the labrum.  This released the negative pressure environment and the amount of traction was adjusted to maintain the 8-50mm of distraction.  A nitinol wire was placed through the needle and flouroscopy was used to ensure it extended to the medial wall of the acetabulum.  The Flowport from [employer] was placed over the wire and the nitinol wire was retracted to just inside the capsule during insertion of the dilator and cannula to minimize the risk of breakage. The arthroscope was placed next and we visualized the anterior triangle.     We then placed the anterior portal under direct visualization using the technique described above.  This was safely placed as well without damage to the labrum or femoral head.  We then switched our arthroscope to the anterior portal to ensure we were not through the labrum - we were safely through the capsule only.  We then proceeded with periportal capsulotomies utilizing the Samurai blade in each portal without connecting the two.  We identified the anterior inferior iliac spine proximally, the psoas tendon medially and the rectus tendon laterally as landmarks.  We then proceeded with a diagnostic arthroscopy - the results can be found in the findings section above.  We then used the radiofrequency device to clear the superior acetabulum and expose the subspinous region.  Next we exposed the acetabular rim leaving the chondral labral junction intact.    When adequate reshaping was obtained we then proceeded with the labral repair. We placed 2 anchors at the 1:00 and 2:30 positions. The sutures  were passed using the crescent Nanopass from [employer].  This resulted in anatomic labral repair.  We debrided the loose cartilage at the rim and residual degenerative labral tissue.  Traction was let down with total traction time of 29 minutes.     Finally, we performed a complete capsular closure with tape suture.  She was replaced in the anterior and posterior limb of the reported capsulotomy with excellent apposition. We then removed the arthroscope and closed the incisions with 3-0 nylon simple stitches.  A sterile dressing was applied..  The patient was awakened from anesthesia and transferred to PACU in stable condition. Postoperative care includes:       POSTOPERATIVE PLAN:    Weight bearing as tolerated operative extremity Formal physical therapy will begin immediately within the first weeks of surgery ASA 325 Daily for DVT prophylaxis      Elspeth LITTIE Parker, MD 1:40 PM

## 2024-03-07 NOTE — Anesthesia Preprocedure Evaluation (Signed)
 Anesthesia Evaluation  Patient identified by MRN, date of birth, ID band Patient awake    Reviewed: Allergy & Precautions, NPO status , Patient's Chart, lab work & pertinent test results  History of Anesthesia Complications (+) PONV and history of anesthetic complications  Airway Mallampati: II  TM Distance: >3 FB Neck ROM: Full    Dental  (+) Teeth Intact, Dental Advisory Given   Pulmonary former smoker   Pulmonary exam normal breath sounds clear to auscultation       Cardiovascular negative cardio ROS Normal cardiovascular exam Rhythm:Regular Rate:Normal     Neuro/Psych negative neurological ROS     GI/Hepatic Neg liver ROS,GERD  Medicated,,  Endo/Other  Hypothyroidism  Obesity   Renal/GU negative Renal ROS Bladder dysfunction      Musculoskeletal RIGHT HIP LABRAL TEAR   Abdominal   Peds  Hematology negative hematology ROS (+)   Anesthesia Other Findings Day of surgery medications reviewed with the patient.  Reproductive/Obstetrics                              Anesthesia Physical Anesthesia Plan  ASA: 2  Anesthesia Plan: General   Post-op Pain Management: Toradol  IV (intra-op)* and Tylenol  PO (pre-op)*   Induction: Intravenous  PONV Risk Score and Plan: 4 or greater and Midazolam , Dexamethasone , Ondansetron  and Scopolamine  patch - Pre-op  Airway Management Planned: Oral ETT  Additional Equipment:   Intra-op Plan:   Post-operative Plan: Extubation in OR  Informed Consent: I have reviewed the patients History and Physical, chart, labs and discussed the procedure including the risks, benefits and alternatives for the proposed anesthesia with the patient or authorized representative who has indicated his/her understanding and acceptance.     Dental advisory given  Plan Discussed with: CRNA  Anesthesia Plan Comments:         Anesthesia Quick Evaluation

## 2024-03-09 NOTE — Therapy (Signed)
 OUTPATIENT PHYSICAL THERAPY EVALUATION   Patient Name: Betty Porter MRN: [id number] DOB:April 10, 1971, 53 y.o., female Today's Date: 03/10/2024  END OF SESSION:  PT End of Session - 03/10/24 1013     Visit Number 1    Number of Visits 28    Date for PT Re-Evaluation 06/18/24    PT Start Time 1015    PT Stop Time 1055    PT Time Calculation (min) 40 min    Activity Tolerance Patient tolerated treatment well    Behavior During Therapy [facility] for tasks assessed/performed          Past Medical History:  Diagnosis Date   Acne    Adnexal mass    complex   Allergy 2010   Seasonal   Fibroid, uterine 2008   GERD (gastroesophageal reflux disease)    takes OTC meds   Hand swelling    Hyperlipidemia    Hypothyroidism    followed by pcp   Joint pain    Lactose intolerance    Lower extremity edema    PONV (postoperative nausea and vomiting)    Pre-diabetes    SUI (stress urinary incontinence, female)    Past Surgical History:  Procedure Laterality Date   CESAREAN SECTION  2009   LAPAROSCOPY  2008   for infertility   TONSILLECTOMY  1977   XI ROBOTIC ASSISTED OOPHORECTOMY N/A 09/12/2020   Procedure: XI ROBOTIC ASSISTED BILATERAL SALPINGECTOMY AND LEFT OOPHORECTOMY;  Surgeon: Viktoria Comer SAUNDERS, MD;  Location: [facility];  Service: Gynecology;  Laterality: N/A;   Patient Active Problem List   Diagnosis Date Noted   Tear of right acetabular labrum 03/07/2024   Chronic back pain 12/09/2023   SOB (shortness of breath) on exertion 12/09/2023   Chronic right hip pain 06/22/2023   Perimenopause 04/01/2023   BMI 31.0-31.9,adult 09/18/2022   Insulin  resistance 06/18/2022   Generalized obesity 04/22/2022   Hypothyroidism 03/25/2022   Other hyperlipidemia 03/25/2022   Depression 03/25/2022   Mixed hyperlipidemia 02/25/2022   Acquired hypothyroidism 02/25/2022   Pre-diabetes 02/25/2022   Vitamin D  deficiency 12/18/2020   At risk for deficient intake of food  12/18/2020   Elevated CA-125    Cyst of left ovary 08/31/2020   TMJ pain dysfunction syndrome 03/31/2017     REFERRING PROVIDER:  Genelle Standing, MD    REFERRING DIAG:  [phone] (ICD-10-CM) - Tear of right acetabular labrum, initial encounter    s/p Rt hip labral repair  Rationale for Evaluation and Treatment: Rehabilitation  THERAPY DIAG:  Pain in right hip  Difficulty in walking, not elsewhere classified  Muscle weakness (generalized)  ONSET DATE: DOS 03/07/24   SUBJECTIVE:  SUBJECTIVE STATEMENT: Sharp/burning at incision site. Stopped doing crossfit in April due to pain.   PERTINENT HISTORY:  none  PAIN:  Are you having pain? Yes: NPRS scale: 7 Pain location: Rt anteiror hip Pain description: sore, sharp Aggravating factors: constant Relieving factors: ice ibuprofen   PRECAUTIONS:  None  RED FLAGS: None   WEIGHT BEARING RESTRICTIONS:  No  FALLS:  Has patient fallen in last 6 months? 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  PATIENT SURVEYS:  LEFS  Extreme difficulty/unable (0), Quite a bit of difficulty (1), Moderate difficulty (2), Little difficulty (3), No difficulty (4) Survey date:    Any of your usual work, housework or school activities 1  2. Usual hobbies, recreational or sporting activities 0  3. Getting into/out of the bath 1  4. Walking between rooms 3  5. Putting on socks/shoes 1  6. Squatting  1  7. Lifting an object, like a bag of groceries from the floor 2  8. Performing light  activities around your home 2  9. Performing heavy activities around your home 0  10. Getting into/out of a car 2  11. Walking 2 blocks 1  12. Walking 1 mile 0  13. Going up/down 10 stairs (1 flight) 0  14. Standing for 1 hour 0  15.  sitting for 1 hour 1  16. Running on even ground 0  17. Running on uneven ground 0  18. Making sharp turns while running fast 0  19. Hopping  0  20. Rolling over in bed 2  Score total:  17/80     COGNITIVE STATUS: Within functional limits for tasks assessed   SENSATION: WFL  GAIT: EVAL: uses AD for long distances but not otherwise; short step length, mild antalgia on Rt.    Body Part #1 Hip  PALPATION: EVAL: soft end feel at 90 deg hip flexion, no s/s of infection at incision site- most lateral incision leaking upon changing bandages  EVAL: good quad and glut set, able to demo controlled prone hamstring curl                                                                                                                            TREATMENT DATE:   EVAL 03/10/24 Changed bandages See HEP Gait training with glut set   PATIENT EDUCATION:  Education details: Anatomy of condition, POC, HEP, exercise form/rationale Person educated: Patient Education method: Explanation, Demonstration, Tactile cues, Verbal cues, and Handouts Education comprehension: verbalized understanding, returned demonstration, verbal cues required, tactile cues required, and needs further education  HOME EXERCISE PROGRAM: Access Code: [email or web address] URL: [email or web address] Date: 03/10/2024 Prepared by: Harlene Cordon  Exercises - Lying Prone  - 3  x daily - 7 x weekly - 2-3 min hold - Prone Gluteal Sets  - 3 x daily - 7 x weekly - 1 sets - 10 reps - 3s hold - Prone Knee Flexion  - 3 x daily - 7 x weekly - 1 sets - 10 reps - Standing Weight Shift  - 5 x daily - 7 x weekly - 1 sets - 10 reps - Supine Transversus Abdominis Bracing - Hands on Ground  - 3 x  daily - 7 x weekly - 1 sets - 10 reps - 3 breaths hold - Supine Bilateral Hamstring Sets  - 3 x daily - 7 x weekly - 1 sets - 10 reps - 5s hold   ASSESSMENT:  CLINICAL IMPRESSION: Patient is a 53 y.o. F who was seen today for physical therapy evaluation and treatment for s/p Rt hip labral repair.     REHAB POTENTIAL: Good  CLINICAL DECISION MAKING: Stable/uncomplicated  EVALUATION COMPLEXITY: Low   GOALS: Goals reviewed with patient? Yes  SHORT TERM GOALS: Target date: 4 weeks 8/25   Pain free flexion to 90 Baseline: Goal status: INITIAL    2.  Demo controlled quad set with hold Baseline:  Goal status: INITIAL    3.  30s sit to stand without pain or compensation Baseline:  Goal status: INITIAL    4.  SLS 30s without compensation Baseline:  Goal status: INITIAL   LONG TERM GOALS:   Able to demo step up on 6 step with level pelvis and proper form Baseline:  Goal status: INITIAL Week 8 9/22   2.  Will tolerate at least 3 min on elliptical, demonstrating good tolerance to repetitive weight bearing motion Baseline:  Goal status: INITIAL  Week 8 9/22   3.  Demonstrate proper form in at least 10 continuous lunges without increased pain Baseline:  Goal status: INITIAL  Week 12 11/3   4.  Demonstrate gentle, double and single foot plyometric motions with good proximal form Baseline:  Goal status: INITIAL Week 12 11/3   5.  Able to perform proper squat and sit<>stand to/from lower chairs that are present at work Baseline:  Goal status: INITIAL target date: POC date   6.  Able to return to CrossFit (preferred workout) with understanding of proper progressions Baseline:  Goal status: INITIAL target date: POC date PLAN:  PT FREQUENCY: 1-2x/week  PT DURATION: POC date  PLANNED INTERVENTIONS: 97164- PT Re-evaluation, 97750- Physical Performance Testing, 97110-Therapeutic exercises, 97530- Therapeutic activity, 97112- Neuromuscular re-education, 97535- Self Care,  97140- Manual therapy, [id number]- Gait training, 02886- Aquatic Therapy, [phone] (1-2 muscles), 20561 (3+ muscles)- Dry Needling, Patient/Family education, Balance training, Stair training, Taping, Joint mobilization, Spinal mobilization, Scar mobilization, and Cryotherapy.  PLAN FOR NEXT SESSION: per hip labral repair protocol   Harlene Cordon, PT, DPT 03/10/2024, 3:42 PM

## 2024-03-10 ENCOUNTER — Other Ambulatory Visit: Payer: Self-pay

## 2024-03-10 ENCOUNTER — Encounter (HOSPITAL_BASED_OUTPATIENT_CLINIC_OR_DEPARTMENT_OTHER): Payer: Self-pay | Admitting: Physical Therapy

## 2024-03-10 ENCOUNTER — Ambulatory Visit (HOSPITAL_BASED_OUTPATIENT_CLINIC_OR_DEPARTMENT_OTHER): Attending: Orthopaedic Surgery | Admitting: Physical Therapy

## 2024-03-10 DIAGNOSIS — M6281 Muscle weakness (generalized): Secondary | ICD-10-CM | POA: Diagnosis present

## 2024-03-10 DIAGNOSIS — R262 Difficulty in walking, not elsewhere classified: Secondary | ICD-10-CM | POA: Insufficient documentation

## 2024-03-10 DIAGNOSIS — M25551 Pain in right hip: Secondary | ICD-10-CM | POA: Diagnosis present

## 2024-03-10 DIAGNOSIS — S73191A Other sprain of right hip, initial encounter: Secondary | ICD-10-CM | POA: Diagnosis not present

## 2024-03-17 ENCOUNTER — Ambulatory Visit (INDEPENDENT_AMBULATORY_CARE_PROVIDER_SITE_OTHER): Admitting: Family Medicine

## 2024-03-17 ENCOUNTER — Encounter (INDEPENDENT_AMBULATORY_CARE_PROVIDER_SITE_OTHER): Payer: Self-pay | Admitting: Family Medicine

## 2024-03-17 VITALS — BP 101/67 | HR 76 | Temp 98.1°F | Ht 61.0 in | Wt 166.0 lb

## 2024-03-17 DIAGNOSIS — R7303 Prediabetes: Secondary | ICD-10-CM | POA: Diagnosis not present

## 2024-03-17 DIAGNOSIS — E559 Vitamin D deficiency, unspecified: Secondary | ICD-10-CM | POA: Diagnosis not present

## 2024-03-17 DIAGNOSIS — F5089 Other specified eating disorder: Secondary | ICD-10-CM

## 2024-03-17 DIAGNOSIS — E669 Obesity, unspecified: Secondary | ICD-10-CM

## 2024-03-17 DIAGNOSIS — E785 Hyperlipidemia, unspecified: Secondary | ICD-10-CM | POA: Diagnosis not present

## 2024-03-17 DIAGNOSIS — E038 Other specified hypothyroidism: Secondary | ICD-10-CM

## 2024-03-17 DIAGNOSIS — Z6831 Body mass index (BMI) 31.0-31.9, adult: Secondary | ICD-10-CM

## 2024-03-17 DIAGNOSIS — F3289 Other specified depressive episodes: Secondary | ICD-10-CM

## 2024-03-17 DIAGNOSIS — E7849 Other hyperlipidemia: Secondary | ICD-10-CM

## 2024-03-17 DIAGNOSIS — Z9889 Other specified postprocedural states: Secondary | ICD-10-CM

## 2024-03-17 MED ORDER — GEMFIBROZIL 600 MG PO TABS: 600.0000 mg | ORAL_TABLET | Freq: Two times a day (BID) | ORAL | 0 refills | Status: DC

## 2024-03-17 MED ORDER — LEVOTHYROXINE SODIUM 75 MCG PO TABS: 75.0000 ug | ORAL_TABLET | Freq: Every day | ORAL | 0 refills | Status: DC

## 2024-03-17 MED ORDER — ATORVASTATIN CALCIUM 10 MG PO TABS: 10.0000 mg | ORAL_TABLET | Freq: Every day | ORAL | 0 refills | Status: DC

## 2024-03-17 MED ORDER — METFORMIN HCL 500 MG PO TABS: 500.0000 mg | ORAL_TABLET | Freq: Two times a day (BID) | ORAL | 0 refills | Status: DC

## 2024-03-17 NOTE — Progress Notes (Signed)
 Office: [phone]  /  Fax: [phone]  WEIGHT SUMMARY AND BIOMETRICS  Anthropometric Measurements Height: 5' 1 (1.549 m) Weight: 166 lb (75.3 kg) BMI (Calculated): 31.38 Weight at Last Visit: 166lb Weight Lost Since Last Visit: 0lb Weight Gained Since Last Visit: 0lb Starting Weight: 178lb Total Weight Loss (lbs): 12 lb (5.443 kg)   Body Composition  Body Fat %: 35.8 % Fat Mass (lbs): 59.4 lbs Muscle Mass (lbs): 101.4 lbs Total Body Water (lbs): 70.4 lbs Visceral Fat Rating : 8   Other Clinical Data Fasting: No Labs: no Today's Visit #: 36 Starting Date: 11/06/20    Chief Complaint: OBESITY   History of Present Illness Betty Porter is a 53 year old female who presents for obesity treatment and progress assessment.  She has been following a category three plan for obesity management but has only adhered to it about thirty percent of the time. She is working on increasing her intake of protein, fruits, and vegetables, and is focusing on hydration. However, she skips meals and does not achieve the recommended seven to nine hours of sleep per night. She also lacks regular exercise. Despite these challenges, she has maintained her weight over the last five weeks since her last visit.  She recently returned from a vacation in [place], which involved a significant amount of walking, contributing to her weight maintenance. She describes the experience as 'nonstop all day walking' and notes that her body felt the exertion at the end of each day.  She underwent hip surgery on March 07, 2024, which has been progressing well. The first two days post-surgery were difficult, but she is now ambulating well. The right hip was the most painful prior to surgery, and now the left hip is very painful.  Regarding her medication, she has been inconsistent with her bupropion , often taking only one dose per day, especially during the summer due to a disrupted routine. She is also  taking Lipitor, Lopid , levothyroxine , and metformin .  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 She anticipates a busy schedule as she adjusts to the new routine.      PHYSICAL EXAM:  Blood pressure 101/67, pulse 76, temperature 98.1 F (36.7 C), height 5' 1 (1.549 m), weight 166 lb (75.3 kg), last menstrual period 10/10/2023, SpO2 99%. Body mass index is 31.37 kg/m.  DIAGNOSTIC DATA REVIEWED:  BMET    Component Value Date/Time   NA 135 03/03/2024 1400   NA 139 12/09/2023 0919   K 4.0 03/03/2024 1400   CL 104 03/03/2024 1400   CO2 24 03/03/2024 1400   GLUCOSE 95 03/03/2024 1400   BUN 20 03/03/2024 1400   BUN 13 12/09/2023 0919   CREATININE 1.13 (H) 03/03/2024 1400   CALCIUM  9.9 03/03/2024 1400   GFRNONAA 59 (L) 03/03/2024 1400   Lab Results  Component Value Date   HGBA1C 5.5 12/09/2023   HGBA1C 5.7 (H) 11/06/2020   Lab Results  Component Value Date   INSULIN  15.2 12/09/2023   INSULIN  11.9 11/06/2020   Lab Results  Component Value Date   TSH 1.960 12/09/2023   CBC    Component Value Date/Time   WBC 6.1 11/06/2020 1216   WBC 5.7 09/10/2020 0917   RBC 4.46 11/06/2020 1216   RBC 4.50 09/10/2020 0917   HGB 13.0 11/06/2020 1216   HCT 38.8 11/06/2020 1216   PLT 299 11/06/2020 1216   MCV 87 11/06/2020 1216  MCH 29.1 11/06/2020 1216   MCH 29.3 09/10/2020 0917   MCHC 33.5 11/06/2020 1216   MCHC 33.1 09/10/2020 0917   RDW 13.1 11/06/2020 1216   Iron Studies No results found for: IRON, TIBC, FERRITIN, IRONPCTSAT Lipid Panel     Component Value Date/Time   CHOL 210 (H) 12/09/2023 0919   TRIG 125 12/09/2023 0919   HDL 61 12/09/2023 0919   LDLCALC 127 (H) 12/09/2023 0919   Hepatic Function Panel     Component Value Date/Time   PROT 6.9 12/09/2023 0919   ALBUMIN 4.4 12/09/2023 0919   AST 24 12/09/2023 0919   ALT 26 12/09/2023 0919   ALKPHOS 81 12/09/2023 0919   BILITOT 0.3 12/09/2023 0919       Component Value Date/Time   TSH 1.960 12/09/2023 0919   Nutritional Lab Results  Component Value Date   VD25OH 47.1 12/09/2023   VD25OH 40.6 07/08/2023   VD25OH 37.4 11/19/2022     Assessment and Plan Assessment & Plan Obesity with emotional eating behaviors Obesity with emotional eating behaviors. She has maintained her weight over the last five weeks. She is working on increasing protein, fruits, and vegetables intake, and hydration but is skipping meals and not getting adequate sleep. She is not currently exercising due to recent hip surgery. - Encourage adherence to the category three plan for obesity management. - Advise on the importance of regular meals and adequate sleep (7-9 hours per night). - Discuss the potential for swimming as a low-impact exercise option post-surgery. - Encourage communication with physical therapy regarding weight loss and muscle mass concerns.  Hyperlipidemia Hyperlipidemia is being managed with medication. - Continue Lipitor and Lopid  (gemfibrozil ) as prescribed. - Continue diet, exercise and weight loss as discussed today as an important part of the treatment plan  Prediabetes Prediabetes is being managed with metformin . - Continue metformin  as prescribed. - Continue diet, exercise and weight loss as discussed today as an important part of the treatment plan  Vitamin D  deficiency Vitamin D  deficiency is being managed with vitamin D  supplementation. She has a sufficient supply and does not require a refill at this time. - Continue vitamin D  supplementation weekly as prescribed.  Status post left hip surgery Status post left hip surgery in 2024. Recovery is progressing well. She is ambulatory and reports improvement in pain. Physical therapy is scheduled to start soon. - Attend follow-up appointment with the surgeon as planned - Begin physical therapy as scheduled.    She was informed of the importance of frequent follow up visits to  maximize her success with intensive lifestyle modifications for her multiple health conditions.    [name], MD

## 2024-03-18 ENCOUNTER — Ambulatory Visit (INDEPENDENT_AMBULATORY_CARE_PROVIDER_SITE_OTHER): Admitting: Orthopaedic Surgery

## 2024-03-18 ENCOUNTER — Ambulatory Visit (HOSPITAL_BASED_OUTPATIENT_CLINIC_OR_DEPARTMENT_OTHER): Attending: Orthopaedic Surgery

## 2024-03-18 ENCOUNTER — Encounter (HOSPITAL_BASED_OUTPATIENT_CLINIC_OR_DEPARTMENT_OTHER): Payer: Self-pay

## 2024-03-18 DIAGNOSIS — M6281 Muscle weakness (generalized): Secondary | ICD-10-CM | POA: Insufficient documentation

## 2024-03-18 DIAGNOSIS — M25551 Pain in right hip: Secondary | ICD-10-CM | POA: Insufficient documentation

## 2024-03-18 DIAGNOSIS — S73191A Other sprain of right hip, initial encounter: Secondary | ICD-10-CM

## 2024-03-18 DIAGNOSIS — R262 Difficulty in walking, not elsewhere classified: Secondary | ICD-10-CM | POA: Diagnosis present

## 2024-03-18 NOTE — Progress Notes (Signed)
 Post Operative Evaluation    Procedure/Date of Surgery: right hip labral repair 7/28 Interval History:    Presents today 2-week status post above procedure.  Overall she is doing well.  She is having some soreness about the anterior lateral thigh particularly muscular achiness with walking longer distances.  She has recently been more active and able to establish with physical therapy   PMH/PSH/Family History/Social History/Meds/Allergies:    Past Medical History:  Diagnosis Date   Acne    Adnexal mass    complex   Allergy 2010   Seasonal   Fibroid, uterine 2008   GERD (gastroesophageal reflux disease)    takes OTC meds   Hand swelling    Hyperlipidemia    Hypothyroidism    followed by pcp   Joint pain    Lactose intolerance    Lower extremity edema    PONV (postoperative nausea and vomiting)    Pre-diabetes    SUI (stress urinary incontinence, female)    Past Surgical History:  Procedure Laterality Date   CESAREAN SECTION  2009   LAPAROSCOPY  2008   for infertility   TONSILLECTOMY  1977   XI ROBOTIC ASSISTED OOPHORECTOMY N/A 09/12/2020   Procedure: XI ROBOTIC ASSISTED BILATERAL SALPINGECTOMY AND LEFT OOPHORECTOMY;  Surgeon: Viktoria Comer SAUNDERS, MD;  Location: [facility];  Service: Gynecology;  Laterality: N/A;   Social History   Socioeconomic History   Marital status: Widowed    Spouse name: Not on file   Number of children: Not on file   Years of education: Not on file   Highest education level: Associate degree: occupational, Scientist, product/process development, or vocational program  Occupational History   Occupation: Chiropractor  Tobacco Use   Smoking status: Former    Current packs/day: 0.00    Average packs/day: 0.8 packs/day for 10.0 years (7.5 ttl pk-yrs)    Types: Cigarettes    Start date: 08/11/1986    Quit date: 08/11/1996    Years since quitting: 27.6   Smokeless tobacco: Never  Vaping Use   Vaping status:  Never Used  Substance and Sexual Activity   Alcohol use: Not Currently   Drug use: Never   Sexual activity: Not Currently    Birth control/protection: None, Surgical    Comment: bil salping, left ooph  Other Topics Concern   Not on file  Social History Narrative   Not on file   Social Drivers of Health   Financial Resource Strain: Low Risk  (06/18/2023)   Overall Financial Resource Strain (CARDIA)    Difficulty of Paying Living Expenses: Not hard at all  Food Insecurity: No Food Insecurity (06/18/2023)   Hunger Vital Sign    Worried About Running Out of Food in the Last Year: Never true    Ran Out of Food in the Last Year: Never true  Transportation Needs: No Transportation Needs (06/18/2023)   PRAPARE - Administrator, Civil Service (Medical): No    Lack of Transportation (Non-Medical): No  Physical Activity: Sufficiently Active (06/18/2023)   Exercise Vital Sign    Days of Exercise per Week: 3 days    Minutes of Exercise per Session: 60 min  Stress: No Stress Concern Present (06/18/2023)   [employer] of Occupational Health - Occupational Stress Questionnaire    Feeling of Stress :  Only a little  Social Connections: Socially Isolated (06/18/2023)   Social Connection and Isolation Panel    Frequency of Communication with Friends and Family: Three times a week    Frequency of Social Gatherings with Friends and Family: Never    Attends Religious Services: Never    Database administrator or Organizations: No    Attends Engineer, structural: Not on file    Marital Status: Widowed   Family History  Problem Relation Age of Onset   Breast cancer Maternal Aunt    Cancer Maternal Aunt    Hypertension Mother    Osteoporosis Mother    Arthritis Mother    Diabetes Father    Hyperlipidemia Father    Obesity Father    Thyroid  cancer Maternal Aunt    Leukemia Maternal Uncle    Diabetes Paternal Grandfather    Cancer Maternal Uncle    Colon cancer Neg Hx     Ovarian cancer Neg Hx    Endometrial cancer Neg Hx    Pancreatic cancer Neg Hx    Prostate cancer Neg Hx    No Known Allergies Current Outpatient Medications  Medication Sig Dispense Refill   aspirin  EC 325 MG tablet TAKE 1 TABLET BY MOUTH DAILY 14 tablet 0   atorvastatin  (LIPITOR) 10 MG tablet Take 1 tablet (10 mg total) by mouth at bedtime. 90 tablet 0   buPROPion  (WELLBUTRIN  SR) 150 MG 12 hr tablet Take 1 tablet (150 mg total) by mouth 2 (two) times daily. 180 tablet 0   gemfibrozil  (LOPID ) 600 MG tablet Take 1 tablet (600 mg total) by mouth in the morning and at bedtime. 90 tablet 0   levothyroxine  (SYNTHROID ) 75 MCG tablet Take 1 tablet (75 mcg total) by mouth daily before breakfast. 90 tablet 0   metFORMIN  (GLUCOPHAGE ) 500 MG tablet Take 1 tablet (500 mg total) by mouth 2 (two) times daily with a meal. 180 tablet 0   oxyCODONE  (ROXICODONE ) 5 MG immediate release tablet Take 1 tablet (5 mg total) by mouth every 4 (four) hours as needed for severe pain (pain score 7-10) or breakthrough pain. (Patient not taking: Reported on 03/17/2024) 10 tablet 0   Vitamin D , Ergocalciferol , (DRISDOL ) 1.25 MG (50000 UNIT) CAPS capsule TAKE ONE CAPSULE BY MOUTH ONCE WEEKLY (EVERY 7 DAYS) 13 capsule 0   No current facility-administered medications for this visit.   No results found.  Review of Systems:   A ROS was performed including pertinent positives and negatives as documented in the HPI.   Musculoskeletal Exam:    Last menstrual period 10/10/2023.  Right hip incisions are well-appearing without erythema or drainage.  Walks without antalgic gait with a mildly shortened stance phase.  Distal neurosensory exam is intact  Imaging:      I personally reviewed and interpreted the radiographs.   Assessment:   Status post right hip labral repair doing well.  At this time should continue to progress range of motion and strengthening.  I will plan to see her back in 4 weeks.  I did discuss that  many of her symptoms are consistent with more of a muscular etiology which will improve over the course the next month  Plan :    - Return to clinic 1 month      I personally saw and evaluated the patient, and participated in the management and treatment plan.  Elspeth Parker, MD Attending Physician, Orthopedic Surgery  This document was dictated using Dragon voice recognition software.  A reasonable attempt at proof reading has been made to minimize errors.

## 2024-03-18 NOTE — Therapy (Signed)
 OUTPATIENT PHYSICAL THERAPY TREATMENT   Patient Name: Betty Porter MRN: [id number] DOB:Oct 26, 1970, 53 y.o., female Today's Date: 03/18/2024  END OF SESSION:  PT End of Session - 03/18/24 0959     Visit Number 2    Number of Visits 28    Date for PT Re-Evaluation 06/18/24    PT Start Time 0937    PT Stop Time 1015    PT Time Calculation (min) 38 min    Activity Tolerance Patient tolerated treatment well    Behavior During Therapy [facility] for tasks assessed/performed           Past Medical History:  Diagnosis Date   Acne    Adnexal mass    complex   Allergy 2010   Seasonal   Fibroid, uterine 2008   GERD (gastroesophageal reflux disease)    takes OTC meds   Hand swelling    Hyperlipidemia    Hypothyroidism    followed by pcp   Joint pain    Lactose intolerance    Lower extremity edema    PONV (postoperative nausea and vomiting)    Pre-diabetes    SUI (stress urinary incontinence, female)    Past Surgical History:  Procedure Laterality Date   CESAREAN SECTION  2009   LAPAROSCOPY  2008   for infertility   TONSILLECTOMY  1977   XI ROBOTIC ASSISTED OOPHORECTOMY N/A 09/12/2020   Procedure: XI ROBOTIC ASSISTED BILATERAL SALPINGECTOMY AND LEFT OOPHORECTOMY;  Surgeon: Viktoria Comer SAUNDERS, MD;  Location: [facility];  Service: Gynecology;  Laterality: N/A;   Patient Active Problem List   Diagnosis Date Noted   Tear of right acetabular labrum 03/07/2024   Chronic back pain 12/09/2023   SOB (shortness of breath) on exertion 12/09/2023   Chronic right hip pain 06/22/2023   Perimenopause 04/01/2023   BMI 31.0-31.9,adult 09/18/2022   Insulin  resistance 06/18/2022   Generalized obesity 04/22/2022   Hypothyroidism 03/25/2022   Other hyperlipidemia 03/25/2022   Depression 03/25/2022   Mixed hyperlipidemia 02/25/2022   Acquired hypothyroidism 02/25/2022   Pre-diabetes 02/25/2022   Vitamin D  deficiency 12/18/2020   At risk for deficient intake of food  12/18/2020   Elevated CA-125    Cyst of left ovary 08/31/2020   TMJ pain dysfunction syndrome 03/31/2017     REFERRING PROVIDER:  Genelle Standing, MD    REFERRING DIAG:  [phone] (ICD-10-CM) - Tear of right acetabular labrum, initial encounter    s/p Rt hip labral repair  Rationale for Evaluation and Treatment: Rehabilitation  THERAPY DIAG:  Pain in right hip  Difficulty in walking, not elsewhere classified  Muscle weakness (generalized)  ONSET DATE: DOS 03/07/24   SUBJECTIVE:  SUBJECTIVE STATEMENT: 2-3/10 pan level at entry. Pt arrives without AD. Had f/u just prior to PT appt and had sutures removed. Reports MD is pleased with progress/healing thus far. Pt will need L labral repair in the future.   PERTINENT HISTORY:  none  PAIN:  Are you having pain? Yes: NPRS scale: 2-3/10 Pain location: Rt anteiror hip Pain description: sore, sharp Aggravating factors: constant Relieving factors: ice ibuprofen   PRECAUTIONS:  None  RED FLAGS: None   WEIGHT BEARING RESTRICTIONS:  No  FALLS:  Has patient fallen in last 6 months? 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  PATIENT SURVEYS:  LEFS  Extreme difficulty/unable (0), Quite a bit of difficulty (1), Moderate difficulty (2), Little difficulty (3), No difficulty (4) Survey date:    Any of your usual work, housework or school activities 1  2. Usual hobbies, recreational or sporting activities 0  3. Getting into/out of the bath 1  4. Walking between rooms 3  5.  Putting on socks/shoes 1  6. Squatting  1  7. Lifting an object, like a bag of groceries from the floor 2  8. Performing light activities around your home 2  9. Performing heavy activities around your home 0  10. Getting into/out of a car 2  11. Walking 2 blocks 1  12. Walking 1 mile 0  13. Going up/down 10 stairs (1 flight) 0  14. Standing for 1 hour 0  15.  sitting for 1 hour 1  16. Running on even ground 0  17. Running on uneven ground 0  18. Making sharp turns while running fast 0  19. Hopping  0  20. Rolling over in bed 2  Score total:  17/80     COGNITIVE STATUS: Within functional limits for tasks assessed   SENSATION: WFL  GAIT: EVAL: uses AD for long distances but not otherwise; short step length, mild antalgia on Rt.    Body Part #1 Hip  PALPATION: EVAL: soft end feel at 90 deg hip flexion, no s/s of infection at incision site- most lateral incision leaking upon changing bandages  EVAL: good quad and glut set, able to demo controlled prone hamstring curl                                                                                                                            TREATMENT DATE:   8/8  PROM R hip Review of precautions Glute squeezes 5 x20 Adductor squeezes 5 x20 Quad sets 5 2x10 Clams (sidelying) trialled but painful to stopped Supine clams unresisted x10 Hamstring sets 5  Pelvic tilts 5 2x10      EVAL 03/10/24 Changed bandages See HEP Gait training  with glut set   PATIENT EDUCATION:  Education details: Anatomy of condition, POC, HEP, exercise form/rationale Person educated: Patient Education method: Explanation, Demonstration, Tactile cues, Verbal cues, and Handouts Education comprehension: verbalized understanding, returned demonstration, verbal cues required, tactile cues required, and needs further education  HOME EXERCISE PROGRAM: Access Code: [email or web address] URL: [email or web address] Date:  03/10/2024 Prepared by: Harlene Cordon  Exercises - Lying Prone  - 3 x daily - 7 x weekly - 2-3 min hold - Prone Gluteal Sets  - 3 x daily - 7 x weekly - 1 sets - 10 reps - 3s hold - Prone Knee Flexion  - 3 x daily - 7 x weekly - 1 sets - 10 reps - Standing Weight Shift  - 5 x daily - 7 x weekly - 1 sets - 10 reps - Supine Transversus Abdominis Bracing - Hands on Ground  - 3 x daily - 7 x weekly - 1 sets - 10 reps - 3 breaths hold - Supine Bilateral Hamstring Sets  - 3 x daily - 7 x weekly - 1 sets - 10 reps - 5s hold   ASSESSMENT:  CLINICAL IMPRESSION:  Pt is 1 week and 4 days s/p. No pain reported with gentle PROM today. Good tolerance for plinth based exercises today. She did have pain when attempting s/l clamshells, so did not continue. Discussed returning to work and activities to avoid. Reviewed current precautions of avoiding flexion past 90deg. Instructed pt to use ice at home after PT.    REHAB POTENTIAL: Good  CLINICAL DECISION MAKING: Stable/uncomplicated  EVALUATION COMPLEXITY: Low   GOALS: Goals reviewed with patient? Yes  SHORT TERM GOALS: Target date: 4 weeks 8/25   Pain free flexion to 90 Baseline: Goal status: INITIAL    2.  Demo controlled quad set with hold Baseline:  Goal status: MET 8/8   3.  30s sit to stand without pain or compensation Baseline:  Goal status: INITIAL    4.  SLS 30s without compensation Baseline:  Goal status: INITIAL   LONG TERM GOALS:   Able to demo step up on 6 step with level pelvis and proper form Baseline:  Goal status: INITIAL Week 8 9/22   2.  Will tolerate at least 3 min on elliptical, demonstrating good tolerance to repetitive weight bearing motion Baseline:  Goal status: INITIAL  Week 8 9/22   3.  Demonstrate proper form in at least 10 continuous lunges without increased pain Baseline:  Goal status: INITIAL  Week 12 11/3   4.  Demonstrate gentle, double and single foot plyometric motions with good  proximal form Baseline:  Goal status: INITIAL Week 12 11/3   5.  Able to perform proper squat and sit<>stand to/from lower chairs that are present at work Baseline:  Goal status: INITIAL target date: POC date   6.  Able to return to [employer] (preferred workout) with understanding of proper progressions Baseline:  Goal status: INITIAL target date: POC date PLAN:  PT FREQUENCY: 1-2x/week  PT DURATION: POC date  PLANNED INTERVENTIONS: 97164- PT Re-evaluation, 97750- Physical Performance Testing, 97110-Therapeutic exercises, 97530- Therapeutic activity, 97112- Neuromuscular re-education, 97535- Self Care, 02859- Manual therapy, [id number]- Gait training, [id number]- Aquatic Therapy, [phone] (1-2 muscles), 20561 (3+ muscles)- Dry Needling, Patient/Family education, Balance training, Stair training, Taping, Joint mobilization, Spinal mobilization, Scar mobilization, and Cryotherapy.  PLAN FOR NEXT SESSION: per hip labral repair protocol   Asberry BRAVO Toryn Mcclinton, PTA 03/18/2024, 10:34 AM

## 2024-03-21 ENCOUNTER — Encounter (HOSPITAL_BASED_OUTPATIENT_CLINIC_OR_DEPARTMENT_OTHER): Admitting: Orthopaedic Surgery

## 2024-03-22 ENCOUNTER — Other Ambulatory Visit (INDEPENDENT_AMBULATORY_CARE_PROVIDER_SITE_OTHER): Payer: Self-pay | Admitting: Family Medicine

## 2024-03-22 DIAGNOSIS — E7849 Other hyperlipidemia: Secondary | ICD-10-CM

## 2024-03-24 ENCOUNTER — Ambulatory Visit (HOSPITAL_BASED_OUTPATIENT_CLINIC_OR_DEPARTMENT_OTHER)

## 2024-03-24 ENCOUNTER — Encounter (HOSPITAL_BASED_OUTPATIENT_CLINIC_OR_DEPARTMENT_OTHER): Payer: Self-pay

## 2024-03-24 DIAGNOSIS — R262 Difficulty in walking, not elsewhere classified: Secondary | ICD-10-CM

## 2024-03-24 DIAGNOSIS — M6281 Muscle weakness (generalized): Secondary | ICD-10-CM

## 2024-03-24 DIAGNOSIS — M25551 Pain in right hip: Secondary | ICD-10-CM

## 2024-03-24 NOTE — Therapy (Signed)
 OUTPATIENT PHYSICAL THERAPY TREATMENT   Patient Name: Betty Porter MRN: [id number] DOB:[birth year], 53 y.o., female Today's Date: 03/24/2024  END OF SESSION:  PT End of Session - 03/24/24 1022     Visit Number 3    Number of Visits 28    Date for PT Re-Evaluation 06/18/24    PT Start Time 1020    PT Stop Time 1058    PT Time Calculation (min) 38 min    Activity Tolerance Patient tolerated treatment well    Behavior During Therapy WFL for tasks assessed/performed            Past Medical History:  Diagnosis Date   Acne    Adnexal mass    complex   Allergy 2010   Seasonal   Fibroid, uterine 2008   GERD (gastroesophageal reflux disease)    takes OTC meds   Hand swelling    Hyperlipidemia    Hypothyroidism    followed by pcp   Joint pain    Lactose intolerance    Lower extremity edema    PONV (postoperative nausea and vomiting)    Pre-diabetes    SUI (stress urinary incontinence, female)    Past Surgical History:  Procedure Laterality Date   CESAREAN SECTION  2009   LAPAROSCOPY  2008   for infertility   TONSILLECTOMY  1977   XI ROBOTIC ASSISTED OOPHORECTOMY N/A 09/12/2020   Procedure: XI ROBOTIC ASSISTED BILATERAL SALPINGECTOMY AND LEFT OOPHORECTOMY;  Surgeon: Viktoria Comer SAUNDERS, MD;  Location: [facility];  Service: Gynecology;  Laterality: N/A;   Patient Active Problem List   Diagnosis Date Noted   Tear of right acetabular labrum 03/07/2024   Chronic back pain 12/09/2023   SOB (shortness of breath) on exertion 12/09/2023   Chronic right hip pain 06/22/2023   Perimenopause 04/01/2023   BMI 31.0-31.9,adult 09/18/2022   Insulin  resistance 06/18/2022   Generalized obesity 04/22/2022   Hypothyroidism 03/25/2022   Other hyperlipidemia 03/25/2022   Depression 03/25/2022   Mixed hyperlipidemia 02/25/2022   Acquired hypothyroidism 02/25/2022   Pre-diabetes 02/25/2022   Vitamin D  deficiency 12/18/2020   At risk for deficient intake of  food 12/18/2020   Elevated CA-125    Cyst of left ovary 08/31/2020   TMJ pain dysfunction syndrome 03/31/2017     REFERRING PROVIDER:  Genelle Standing, MD    REFERRING DIAG:  [phone] (ICD-10-CM) - Tear of right acetabular labrum, initial encounter    s/p Rt hip labral repair  Rationale for Evaluation and Treatment: Rehabilitation  THERAPY DIAG:  Pain in right hip  Difficulty in walking, not elsewhere classified  Muscle weakness (generalized)  ONSET DATE: DOS 03/07/24   SUBJECTIVE:  SUBJECTIVE STATEMENT: Pt reports having some pain in R hip after last PT session. Pt denies pain at entry in R hip. L hip has been bothering her more than R.   PERTINENT HISTORY:  none  PAIN:  Are you having pain? No  PRECAUTIONS:  None  RED FLAGS: None   WEIGHT BEARING RESTRICTIONS:  No  FALLS:  Has patient fallen in last 6 months? 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  PATIENT SURVEYS:  LEFS  Extreme difficulty/unable (0), Quite a bit of difficulty (1), Moderate difficulty (2), Little difficulty (3), No difficulty (4) Survey date:    Any of your usual work, housework or school activities 1  2. Usual hobbies, recreational or sporting activities 0  3. Getting into/out of the bath 1  4. Walking between rooms 3  5. Putting on socks/shoes 1  6. Squatting  1  7. Lifting an object, like a bag of groceries from the floor 2  8. Performing light activities around your home 2  9. Performing heavy activities around your home 0   10. Getting into/out of a car 2  11. Walking 2 blocks 1  12. Walking 1 mile 0  13. Going up/down 10 stairs (1 flight) 0  14. Standing for 1 hour 0  15.  sitting for 1 hour 1  16. Running on even ground 0  17. Running on uneven ground 0  18. Making sharp turns while running fast 0  19. Hopping  0  20. Rolling over in bed 2  Score total:  17/80     COGNITIVE STATUS: Within functional limits for tasks assessed   SENSATION: WFL  GAIT: EVAL: uses AD for long distances but not otherwise; short step length, mild antalgia on Rt.    Body Part #1 Hip  PALPATION: EVAL: soft end feel at 90 deg hip flexion, no s/s of infection at incision site- most lateral incision leaking upon changing bandages  EVAL: good quad and glut set, able to demo controlled prone hamstring curl                                                                                                                            TREATMENT DATE:   8/14  PROM R hip Adductor squeezes 5 x20 Clams in hooklying x10 Supine clams unresisted x10 Pelvic tilts 5 x10 Prone IR/ER 2x10 Quadruped rocking fwd/back and lateral x10ea LAQ x10 (some anterior hip discomfort) Standing HR/TR 2x10   8/8  PROM R hip Review of precautions Glute squeezes 5 x20 Adductor squeezes 5 x20 Quad sets 5 2x10 Clams (sidelying) trialled but painful to stopped Supine clams unresisted x10 Hamstring sets 5  Pelvic tilts 5  2x10      EVAL 03/10/24 Changed bandages See HEP Gait training with glut set   PATIENT EDUCATION:  Education details: Anatomy of condition, POC, HEP, exercise form/rationale Person educated: Patient Education method: Explanation, Demonstration, Tactile cues, Verbal cues, and Handouts Education comprehension: verbalized understanding, returned demonstration, verbal cues required, tactile cues required, and needs further education  HOME EXERCISE PROGRAM: Access Code: [email or web address] URL:  [email or web address] Date: 03/10/2024 Prepared by: Harlene Cordon  Exercises - Lying Prone  - 3 x daily - 7 x weekly - 2-3 min hold - Prone Gluteal Sets  - 3 x daily - 7 x weekly - 1 sets - 10 reps - 3s hold - Prone Knee Flexion  - 3 x daily - 7 x weekly - 1 sets - 10 reps - Standing Weight Shift  - 5 x daily - 7 x weekly - 1 sets - 10 reps - Supine Transversus Abdominis Bracing - Hands on Ground  - 3 x daily - 7 x weekly - 1 sets - 10 reps - 3 breaths hold - Supine Bilateral Hamstring Sets  - 3 x daily - 7 x weekly - 1 sets - 10 reps - 5s hold   ASSESSMENT:  CLINICAL IMPRESSION:  Pt over 2 weeks s/p. Good tolerance for gentle protocol progressions today. Some anterior hip discomfort with bridges, so stopped after first set. Denied pain with hooklying clams and prone rotations. Instructed pt to continue with ice use to manage pain at home. Will continue to progress as tolerated.    REHAB POTENTIAL: Good  CLINICAL DECISION MAKING: Stable/uncomplicated  EVALUATION COMPLEXITY: Low   GOALS: Goals reviewed with patient? Yes  SHORT TERM GOALS: Target date: 4 weeks 8/25   Pain free flexion to 90 Baseline: Goal status: INITIAL    2.  Demo controlled quad set with hold Baseline:  Goal status: MET 8/8   3.  30s sit to stand without pain or compensation Baseline:  Goal status: INITIAL    4.  SLS 30s without compensation Baseline:  Goal status: INITIAL   LONG TERM GOALS:   Able to demo step up on 6 step with level pelvis and proper form Baseline:  Goal status: INITIAL Week 8 9/22   2.  Will tolerate at least 3 min on elliptical, demonstrating good tolerance to repetitive weight bearing motion Baseline:  Goal status: INITIAL  Week 8 9/22   3.  Demonstrate proper form in at least 10 continuous lunges without increased pain Baseline:  Goal status: INITIAL  Week 12 11/3   4.  Demonstrate gentle, double and single foot plyometric motions with good  proximal form Baseline:  Goal status: INITIAL Week 12 11/3   5.  Able to perform proper squat and sit<>stand to/from lower chairs that are present at work Baseline:  Goal status: INITIAL target date: POC date   6.  Able to return to [employer] (preferred workout) with understanding of proper progressions Baseline:  Goal status: INITIAL target date: POC date PLAN:  PT FREQUENCY: 1-2x/week  PT DURATION: POC date  PLANNED INTERVENTIONS: 97164- PT Re-evaluation, 97750- Physical Performance Testing, 97110-Therapeutic exercises, 97530- Therapeutic activity, 97112- Neuromuscular re-education, 97535- Self Care, 02859- Manual therapy, [id number]- Gait training, [phone]- Aquatic Therapy, [phone] (1-2 muscles), 20561 (3+ muscles)- Dry Needling, Patient/Family education, Balance training, Stair training, Taping, Joint mobilization, Spinal mobilization, Scar mobilization, and Cryotherapy.  PLAN FOR NEXT SESSION: per hip labral repair protocol   [place] BRAVO Betty Porter, PTA 03/24/2024, 12:14 PM

## 2024-03-29 ENCOUNTER — Encounter (HOSPITAL_BASED_OUTPATIENT_CLINIC_OR_DEPARTMENT_OTHER): Payer: Self-pay | Admitting: Physical Therapy

## 2024-03-29 ENCOUNTER — Ambulatory Visit (HOSPITAL_BASED_OUTPATIENT_CLINIC_OR_DEPARTMENT_OTHER): Admitting: Physical Therapy

## 2024-03-29 DIAGNOSIS — M25551 Pain in right hip: Secondary | ICD-10-CM

## 2024-03-29 DIAGNOSIS — R262 Difficulty in walking, not elsewhere classified: Secondary | ICD-10-CM

## 2024-03-29 DIAGNOSIS — M6281 Muscle weakness (generalized): Secondary | ICD-10-CM

## 2024-03-29 NOTE — Therapy (Signed)
 OUTPATIENT PHYSICAL THERAPY TREATMENT   Patient Name: Betty Porter MRN: [id number] DOB:Aug 10, 1971, 53 y.o., female Today's Date: 03/30/2024  END OF SESSION:  PT End of Session - 03/29/24 1621     Visit Number 4    Number of Visits 28    Date for PT Re-Evaluation 06/18/24    PT Start Time 1536    PT Stop Time 1620    PT Time Calculation (min) 44 min    Activity Tolerance Patient tolerated treatment well    Behavior During Therapy [facility] for tasks assessed/performed             Past Medical History:  Diagnosis Date   Acne    Adnexal mass    complex   Allergy 2010   Seasonal   Fibroid, uterine 2008   GERD (gastroesophageal reflux disease)    takes OTC meds   Hand swelling    Hyperlipidemia    Hypothyroidism    followed by pcp   Joint pain    Lactose intolerance    Lower extremity edema    PONV (postoperative nausea and vomiting)    Pre-diabetes    SUI (stress urinary incontinence, female)    Past Surgical History:  Procedure Laterality Date   CESAREAN SECTION  2009   LAPAROSCOPY  2008   for infertility   TONSILLECTOMY  1977   XI ROBOTIC ASSISTED OOPHORECTOMY N/A 09/12/2020   Procedure: XI ROBOTIC ASSISTED BILATERAL SALPINGECTOMY AND LEFT OOPHORECTOMY;  Surgeon: Viktoria Comer SAUNDERS, MD;  Location: [facility];  Service: Gynecology;  Laterality: N/A;   Patient Active Problem List   Diagnosis Date Noted   Tear of right acetabular labrum 03/07/2024   Chronic back pain 12/09/2023   SOB (shortness of breath) on exertion 12/09/2023   Chronic right hip pain 06/22/2023   Perimenopause 04/01/2023   BMI 31.0-31.9,adult 09/18/2022   Insulin  resistance 06/18/2022   Generalized obesity 04/22/2022   Hypothyroidism 03/25/2022   Other hyperlipidemia 03/25/2022   Depression 03/25/2022   Mixed hyperlipidemia 02/25/2022   Acquired hypothyroidism 02/25/2022   Pre-diabetes 02/25/2022   Vitamin D  deficiency 12/18/2020   At risk for deficient intake of  food 12/18/2020   Elevated CA-125    Cyst of left ovary 08/31/2020   TMJ pain dysfunction syndrome 03/31/2017     REFERRING PROVIDER:  Genelle Standing, MD    REFERRING DIAG:  [phone] (ICD-10-CM) - Tear of right acetabular labrum, initial encounter    s/p Rt hip labral repair  Rationale for Evaluation and Treatment: Rehabilitation  THERAPY DIAG:  Pain in right hip  Difficulty in walking, not elsewhere classified  Muscle weakness (generalized)  ONSET DATE: DOS 03/07/24   SUBJECTIVE:  SUBJECTIVE STATEMENT: Pt is 3 weeks and 1 day s/p Right hip labral repair.  Pt had some muscle pain the evening after prior Rx which was fine the following AM.  Pt reports she has been performing some of her HEP.  Pt returned to work yesterday and reports having increased pain.  The kids have not returned to school yet.     PERTINENT HISTORY:  none  PAIN:  3-4/10 in R anterior hip  PRECAUTIONS:  None  RED FLAGS: None   WEIGHT BEARING RESTRICTIONS:  No  FALLS:  Has patient fallen in last 6 months? 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  PATIENT SURVEYS:  LEFS  Extreme difficulty/unable (0), Quite a bit of difficulty (1), Moderate difficulty (2), Little difficulty (3), No difficulty (4) Survey date:    Any of your usual work, housework or school activities 1  2. Usual hobbies, recreational or sporting activities 0  3. Getting into/out of the bath 1  4. Walking between rooms 3  5. Putting on socks/shoes 1  6. Squatting  1   7. Lifting an object, like a bag of groceries from the floor 2  8. Performing light activities around your home 2  9. Performing heavy activities around your home 0  10. Getting into/out of a car 2  11. Walking 2 blocks 1  12. Walking 1 mile 0  13. Going up/down 10 stairs (1 flight) 0  14. Standing for 1 hour 0  15.  sitting for 1 hour 1  16. Running on even ground 0  17. Running on uneven ground 0  18. Making sharp turns while running fast 0  19. Hopping  0  20. Rolling over in bed 2  Score total:  17/80     COGNITIVE STATUS: Within functional limits for tasks assessed   SENSATION: WFL  GAIT: EVAL: uses AD for long distances but not otherwise; short step length, mild antalgia on Rt.    Body Part #1 Hip  PALPATION: EVAL: soft end feel at 90 deg hip flexion, no s/s of infection at incision site- most lateral incision leaking upon changing bandages  EVAL: good quad and glut set, able to demo controlled prone hamstring curl                                                                                                                            TREATMENT DATE:   8/19 Pt received R hip PROM in flexion, abd, ER, and IR in supine per protocol ranges w/n pt and tissue tolerance.   Prone knee flexion 2x10 Q-ped rocking 2x10 Q-ped arm lifts 2x10 Hooklying bent knee fall outs with TrA w/n protocol range 2x10  PPT 2x10 Upright bike w/n protocol ranges x 5 mins  8/14  PROM R hip Adductor squeezes 5 x20 Clams in hooklying x10 Supine clams unresisted x10 Pelvic tilts 5 x10 Prone IR/ER 2x10 Quadruped rocking fwd/back and lateral x10ea LAQ x10 (some anterior hip discomfort) Standing HR/TR 2x10   8/8  PROM R hip Review of precautions Glute squeezes 5 x20 Adductor squeezes 5 x20 Quad sets 5 2x10 Clams (sidelying) trialled but painful to stopped Supine clams unresisted x10 Hamstring sets 5  Pelvic tilts 5 2x10    PATIENT EDUCATION:  Education details:   PT instructed pt to call or message MD about work to find out if she is cleared to return to work.  Anatomy of condition, POC, HEP, exercise form/rationale Person educated: Patient Education method: Explanation, Demonstration, Tactile cues, Verbal cues, and Handouts Education comprehension: verbalized understanding, returned demonstration, verbal cues required, tactile cues required, and needs further education  HOME EXERCISE PROGRAM: Access Code: [email or web address] URL: [email or web address] Date: 03/10/2024 Prepared by: Harlene Cordon  Exercises - Lying Prone  - 3 x daily - 7 x weekly - 2-3 min hold - Prone Gluteal Sets  - 3 x daily - 7 x weekly - 1 sets - 10 reps - 3s hold - Prone Knee Flexion  - 3 x daily - 7 x weekly - 1 sets - 10 reps - Standing Weight Shift  - 5 x daily - 7 x weekly - 1 sets - 10 reps - Supine Transversus Abdominis Bracing - Hands on Ground  - 3 x daily - 7 x weekly - 1 sets - 10 reps - 3 breaths hold - Supine Bilateral Hamstring Sets  - 3 x daily - 7 x weekly - 1 sets - 10 reps - 5s hold   ASSESSMENT:  CLINICAL IMPRESSION:  Pt returned to work yesterday and reports having increased pain.  PT instructed pt to call or message MD about if she is cleared to return to work.  PT performed PROM per protocol w/n pt and tissue tolerance and she tolerated PROM well.  Pt performed exercises per protocol well with cuing and instruction in correct form and w/n protocol ranges.  Pt responded well to Rx reporting no increased pain after Rx.  PT instructed pt in using ice.     REHAB POTENTIAL: Good  CLINICAL DECISION MAKING: Stable/uncomplicated  EVALUATION COMPLEXITY: Low   GOALS: Goals reviewed with patient? Yes  SHORT TERM GOALS: Target date: 4 weeks 8/25   Pain free flexion to 90 Baseline: Goal status: INITIAL    2.  Demo controlled quad set with hold Baseline:  Goal status: MET 8/8   3.  30s sit to stand without pain or compensation Baseline:   Goal status: INITIAL    4.  SLS 30s without compensation Baseline:  Goal status: INITIAL   LONG TERM GOALS:   Able to demo step up on 6 step with level pelvis and proper form Baseline:  Goal status: INITIAL Week 8 9/22   2.  Will tolerate at least 3 min on elliptical, demonstrating good tolerance to repetitive weight bearing motion Baseline:  Goal status: INITIAL  Week 8 9/22   3.  Demonstrate proper form in at least 10 continuous lunges without increased pain Baseline:  Goal status: INITIAL  Week 12 11/3   4.  Demonstrate gentle, double and single foot plyometric motions with good proximal form Baseline:  Goal status: INITIAL Week 12 11/3   5.  Able to perform proper squat  and sit<>stand to/from lower chairs that are present at work Baseline:  Goal status: INITIAL target date: POC date   6.  Able to return to [employer] (preferred workout) with understanding of proper progressions Baseline:  Goal status: INITIAL target date: POC date PLAN:  PT FREQUENCY: 1-2x/week  PT DURATION: POC date  PLANNED INTERVENTIONS: 97164- PT Re-evaluation, 97750- Physical Performance Testing, 97110-Therapeutic exercises, 97530- Therapeutic activity, 97112- Neuromuscular re-education, 97535- Self Care, 02859- Manual therapy, [id number]- Gait training, [phone]- Aquatic Therapy, [phone] (1-2 muscles), [id number] (3+ muscles)- Dry Needling, Patient/Family education, Balance training, Stair training, Taping, Joint mobilization, Spinal mobilization, Scar mobilization, and Cryotherapy.  PLAN FOR NEXT SESSION: per hip labral repair protocol   Leigh Minerva III PT, DPT 03/30/24 9:52 PM

## 2024-03-31 ENCOUNTER — Ambulatory Visit (HOSPITAL_BASED_OUTPATIENT_CLINIC_OR_DEPARTMENT_OTHER): Admitting: Physical Therapy

## 2024-04-05 ENCOUNTER — Encounter (HOSPITAL_BASED_OUTPATIENT_CLINIC_OR_DEPARTMENT_OTHER): Admitting: Physical Therapy

## 2024-04-07 ENCOUNTER — Encounter (HOSPITAL_BASED_OUTPATIENT_CLINIC_OR_DEPARTMENT_OTHER): Admitting: Physical Therapy

## 2024-04-12 ENCOUNTER — Encounter (HOSPITAL_BASED_OUTPATIENT_CLINIC_OR_DEPARTMENT_OTHER): Payer: Self-pay

## 2024-04-12 ENCOUNTER — Ambulatory Visit (HOSPITAL_BASED_OUTPATIENT_CLINIC_OR_DEPARTMENT_OTHER): Attending: Orthopaedic Surgery

## 2024-04-12 DIAGNOSIS — R262 Difficulty in walking, not elsewhere classified: Secondary | ICD-10-CM | POA: Insufficient documentation

## 2024-04-12 DIAGNOSIS — M6281 Muscle weakness (generalized): Secondary | ICD-10-CM | POA: Diagnosis present

## 2024-04-12 DIAGNOSIS — M25551 Pain in right hip: Secondary | ICD-10-CM | POA: Diagnosis present

## 2024-04-12 NOTE — Therapy (Signed)
 OUTPATIENT PHYSICAL THERAPY TREATMENT   Patient Name: Betty Porter MRN: [id number] DOB:1971/01/01, 53 y.o., female Today's Date: 04/12/2024  END OF SESSION:  PT End of Session - 04/12/24 1703     Visit Number 5    Number of Visits 28    Date for PT Re-Evaluation 06/18/24    PT Start Time 1604    PT Stop Time 1645    PT Time Calculation (min) 41 min    Activity Tolerance Patient tolerated treatment well    Behavior During Therapy [facility] for tasks assessed/performed              Past Medical History:  Diagnosis Date   Acne    Adnexal mass    complex   Allergy 2010   Seasonal   Fibroid, uterine 2008   GERD (gastroesophageal reflux disease)    takes OTC meds   Hand swelling    Hyperlipidemia    Hypothyroidism    followed by pcp   Joint pain    Lactose intolerance    Lower extremity edema    PONV (postoperative nausea and vomiting)    Pre-diabetes    SUI (stress urinary incontinence, female)    Past Surgical History:  Procedure Laterality Date   CESAREAN SECTION  2009   LAPAROSCOPY  2008   for infertility   TONSILLECTOMY  1977   XI ROBOTIC ASSISTED OOPHORECTOMY N/A 09/12/2020   Procedure: XI ROBOTIC ASSISTED BILATERAL SALPINGECTOMY AND LEFT OOPHORECTOMY;  Surgeon: Viktoria Comer SAUNDERS, MD;  Location: [facility];  Service: Gynecology;  Laterality: N/A;   Patient Active Problem List   Diagnosis Date Noted   Tear of right acetabular labrum 03/07/2024   Chronic back pain 12/09/2023   SOB (shortness of breath) on exertion 12/09/2023   Chronic right hip pain 06/22/2023   Perimenopause 04/01/2023   BMI 31.0-31.9,adult 09/18/2022   Insulin  resistance 06/18/2022   Generalized obesity 04/22/2022   Hypothyroidism 03/25/2022   Other hyperlipidemia 03/25/2022   Depression 03/25/2022   Mixed hyperlipidemia 02/25/2022   Acquired hypothyroidism 02/25/2022   Pre-diabetes 02/25/2022   Vitamin D  deficiency 12/18/2020   At risk for deficient intake  of food 12/18/2020   Elevated CA-125    Cyst of left ovary 08/31/2020   TMJ pain dysfunction syndrome 03/31/2017     REFERRING PROVIDER:  Genelle Standing, MD    REFERRING DIAG:  [phone] (ICD-10-CM) - Tear of right acetabular labrum, initial encounter    s/p Rt hip labral repair  Rationale for Evaluation and Treatment: Rehabilitation  THERAPY DIAG:  Difficulty in walking, not elsewhere classified  Pain in right hip  Muscle weakness (generalized)  ONSET DATE: DOS 03/07/24   SUBJECTIVE:  SUBJECTIVE STATEMENT: Pt reports some soreness after working in her yard over the weekend. No pain at rest, but does have pain with certain movements.   PERTINENT HISTORY:  none  PAIN:  0/10 in R anterior hip  PRECAUTIONS:  None  RED FLAGS: None   WEIGHT BEARING RESTRICTIONS:  No  FALLS:  Has patient fallen in last 6 months? 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  PATIENT SURVEYS:  LEFS  Extreme difficulty/unable (0), Quite a bit of difficulty (1), Moderate difficulty (2), Little difficulty (3), No difficulty (4) Survey date:    Any of your usual work, housework or school activities 1  2. Usual hobbies, recreational or sporting activities 0  3. Getting into/out of the bath 1  4. Walking between rooms 3  5. Putting on socks/shoes 1  6. Squatting  1  7. Lifting an object, like a bag of groceries from the floor 2  8. Performing light activities around your home 2  9. Performing heavy activities around your home 0  10.  Getting into/out of a car 2  11. Walking 2 blocks 1  12. Walking 1 mile 0  13. Going up/down 10 stairs (1 flight) 0  14. Standing for 1 hour 0  15.  sitting for 1 hour 1  16. Running on even ground 0  17. Running on uneven ground 0  18. Making sharp turns while running fast 0  19. Hopping  0  20. Rolling over in bed 2  Score total:  17/80     COGNITIVE STATUS: Within functional limits for tasks assessed   SENSATION: WFL  GAIT: EVAL: uses AD for long distances but not otherwise; short step length, mild antalgia on Rt.    Body Part #1 Hip  PALPATION: EVAL: soft end feel at 90 deg hip flexion, no s/s of infection at incision site- most lateral incision leaking upon changing bandages  EVAL: good quad and glut set, able to demo controlled prone hamstring curl                                                                                                                            TREATMENT DATE:   04/12/24 Upright bike w/n protocol ranges x 5 mins Seat 10 R hip PROM Hooklying adductor sqz 5 2x5 STM to lateral hip and ITB S/l clam 2x5 (improved tolerance after STM) LAQ 3# 2x10 Sit to stands from elevated plinth 2x10 HR 2x10 (more weight thorough RLE) Side stepping along rail x4 laps (unresisted) Standing marches x10    8/19 Pt received R hip PROM in flexion, abd, ER, and IR in supine per protocol ranges w/n pt and tissue tolerance.  Prone knee flexion 2x10 Q-ped rocking 2x10 Q-ped arm lifts 2x10 Hooklying bent knee fall outs with TrA w/n protocol range 2x10 PPT 2x10 Upright bike w/n protocol ranges x 5 mins  8/14  PROM R hip Adductor squeezes 5 x20 Clams in hooklying x10 Supine clams unresisted x10 Pelvic tilts 5 x10 Prone IR/ER 2x10 Quadruped rocking fwd/back and lateral x10ea LAQ x10 (some anterior hip discomfort) Standing HR/TR 2x10   8/8  PROM R hip Review of precautions Glute squeezes 5 x20 Adductor squeezes 5 x20 Quad sets 5  2x10 Clams (sidelying) trialled but painful to stopped Supine clams unresisted x10 Hamstring sets 5  Pelvic tilts 5 2x10    PATIENT EDUCATION:  Education details:  PT instructed pt to call or message MD about work to find out if she is cleared to return to work.  Anatomy of condition, POC, HEP, exercise form/rationale Person educated: Patient Education method: Explanation, Demonstration, Tactile cues, Verbal cues, and Handouts Education comprehension: verbalized understanding, returned demonstration, verbal cues required, tactile cues required, and needs further education  HOME EXERCISE PROGRAM: Access Code: [email or web address] URL: [email or web address] Date: 03/10/2024 Prepared by: Harlene Cordon  Exercises - Lying Prone  - 3 x daily - 7 x weekly - 2-3 min hold - Prone Gluteal Sets  - 3 x daily - 7 x weekly - 1 sets - 10 reps - 3s hold - Prone Knee Flexion  - 3 x daily - 7 x weekly - 1 sets - 10 reps - Standing Weight Shift  - 5 x daily - 7 x weekly - 1 sets - 10 reps - Supine Transversus Abdominis Bracing - Hands on Ground  - 3 x daily - 7 x weekly - 1 sets - 10 reps - 3 breaths hold - Supine Bilateral Hamstring Sets  - 3 x daily - 7 x weekly - 1 sets - 10 reps - 5s hold   ASSESSMENT:  CLINICAL IMPRESSION:  Pt continues to have discomfort with s/l clams. Tender to lateral hip and TFL/ITB. Spent time on STM to this area and trialed  s/l clams again with decreased pain. Worked on lateral stepping at railing to challenge lateral hip strength without c/o pain, though muscular fatigue present. Educated on self STM and scar massage as pt's primary pain is located at incision area. Will continue to progress as tolerated.    REHAB POTENTIAL: Good  CLINICAL DECISION MAKING: Stable/uncomplicated  EVALUATION COMPLEXITY: Low   GOALS: Goals reviewed with patient? Yes  SHORT TERM GOALS: Target date: 4 weeks 8/25   Pain free flexion to 90 Baseline: Goal status: INITIAL     2.  Demo controlled quad set with hold Baseline:  Goal status: MET 8/8   3.  30s sit to stand without pain or compensation Baseline:  Goal status: INITIAL    4.  SLS 30s without compensation Baseline:  Goal status: INITIAL   LONG TERM GOALS:   Able to demo step up on 6 step with level pelvis and proper form Baseline:  Goal status: INITIAL Week 8 9/22   2.  Will tolerate at least 3 min on elliptical, demonstrating good tolerance to repetitive weight bearing motion Baseline:  Goal status: INITIAL  Week 8 9/22   3.  Demonstrate proper form in at least 10 continuous lunges without increased pain Baseline:  Goal status: INITIAL  Week 12 11/3   4.  Demonstrate gentle, double and single foot plyometric motions with good proximal form Baseline:  Goal status: INITIAL Week 12  11/3   5.  Able to perform proper squat and sit<>stand to/from lower chairs that are present at work Baseline:  Goal status: INITIAL target date: POC date   6.  Able to return to [employer] (preferred workout) with understanding of proper progressions Baseline:  Goal status: INITIAL target date: POC date PLAN:  PT FREQUENCY: 1-2x/week  PT DURATION: POC date  PLANNED INTERVENTIONS: 97164- PT Re-evaluation, 97750- Physical Performance Testing, 97110-Therapeutic exercises, 97530- Therapeutic activity, 97112- Neuromuscular re-education, 97535- Self Care, 02859- Manual therapy, [id number]- Gait training, [phone]- Aquatic Therapy, [phone] (1-2 muscles), [id number] (3+ muscles)- Dry Needling, Patient/Family education, Balance training, Stair training, Taping, Joint mobilization, Spinal mobilization, Scar mobilization, and Cryotherapy.  PLAN FOR NEXT SESSION: per hip labral repair protocol   Asberry Rodes, PTA  04/12/24 5:07 PM

## 2024-04-14 ENCOUNTER — Encounter (HOSPITAL_BASED_OUTPATIENT_CLINIC_OR_DEPARTMENT_OTHER): Payer: Self-pay

## 2024-04-14 ENCOUNTER — Ambulatory Visit (HOSPITAL_BASED_OUTPATIENT_CLINIC_OR_DEPARTMENT_OTHER)

## 2024-04-14 DIAGNOSIS — R262 Difficulty in walking, not elsewhere classified: Secondary | ICD-10-CM

## 2024-04-14 DIAGNOSIS — M25551 Pain in right hip: Secondary | ICD-10-CM

## 2024-04-14 DIAGNOSIS — M6281 Muscle weakness (generalized): Secondary | ICD-10-CM

## 2024-04-14 NOTE — Therapy (Signed)
 OUTPATIENT PHYSICAL THERAPY TREATMENT   Patient Name: Betty Porter MRN: [id number] DOB:01/14/1971, 53 y.o., female Today's Date: 04/14/2024  END OF SESSION:  PT End of Session - 04/14/24 1605     Visit Number 6    Number of Visits 28    Date for PT Re-Evaluation 06/18/24    PT Start Time 1604    PT Stop Time 1648    PT Time Calculation (min) 44 min    Activity Tolerance Patient tolerated treatment well    Behavior During Therapy [facility] for tasks assessed/performed               Past Medical History:  Diagnosis Date   Acne    Adnexal mass    complex   Allergy 2010   Seasonal   Fibroid, uterine 2008   GERD (gastroesophageal reflux disease)    takes OTC meds   Hand swelling    Hyperlipidemia    Hypothyroidism    followed by pcp   Joint pain    Lactose intolerance    Lower extremity edema    PONV (postoperative nausea and vomiting)    Pre-diabetes    SUI (stress urinary incontinence, female)    Past Surgical History:  Procedure Laterality Date   CESAREAN SECTION  2009   LAPAROSCOPY  2008   for infertility   TONSILLECTOMY  1977   XI ROBOTIC ASSISTED OOPHORECTOMY N/A 09/12/2020   Procedure: XI ROBOTIC ASSISTED BILATERAL SALPINGECTOMY AND LEFT OOPHORECTOMY;  Surgeon: Viktoria Comer SAUNDERS, MD;  Location: [facility];  Service: Gynecology;  Laterality: N/A;   Patient Active Problem List   Diagnosis Date Noted   Tear of right acetabular labrum 03/07/2024   Chronic back pain 12/09/2023   SOB (shortness of breath) on exertion 12/09/2023   Chronic right hip pain 06/22/2023   Perimenopause 04/01/2023   BMI 31.0-31.9,adult 09/18/2022   Insulin  resistance 06/18/2022   Generalized obesity 04/22/2022   Hypothyroidism 03/25/2022   Other hyperlipidemia 03/25/2022   Depression 03/25/2022   Mixed hyperlipidemia 02/25/2022   Acquired hypothyroidism 02/25/2022   Pre-diabetes 02/25/2022   Vitamin D  deficiency 12/18/2020   At risk for deficient intake  of food 12/18/2020   Elevated CA-125    Cyst of left ovary 08/31/2020   TMJ pain dysfunction syndrome 03/31/2017     REFERRING PROVIDER:  Genelle Standing, MD    REFERRING DIAG:  [id number] (ICD-10-CM) - Tear of right acetabular labrum, initial encounter    s/p Rt hip labral repair  Rationale for Evaluation and Treatment: Rehabilitation  THERAPY DIAG:  Muscle weakness (generalized)  Pain in right hip  Difficulty in walking, not elsewhere classified  ONSET DATE: DOS 03/07/24   SUBJECTIVE:  SUBJECTIVE STATEMENT: Pt reports muscle soreness after last PT session, but no pain. Having some increased pain today and unsure why. I'm and and down a lot at work, I wonder if that's why.  PERTINENT HISTORY:  none  PAIN:  0/10 in R anterior hip  PRECAUTIONS:  None  RED FLAGS: None   WEIGHT BEARING RESTRICTIONS:  No  FALLS:  Has patient fallen in last 6 months? 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  PATIENT SURVEYS:  LEFS  Extreme difficulty/unable (0), Quite a bit of difficulty (1), Moderate difficulty (2), Little difficulty (3), No difficulty (4) Survey date:    Any of your usual work, housework or school activities 1  2. Usual hobbies, recreational or sporting activities 0  3. Getting into/out of the bath 1  4. Walking between rooms 3  5. Putting on socks/shoes 1  6. Squatting  1  7. Lifting an object, like a bag of groceries from the floor 2  8. Performing light activities around your home 2  9. Performing  heavy activities around your home 0  10. Getting into/out of a car 2  11. Walking 2 blocks 1  12. Walking 1 mile 0  13. Going up/down 10 stairs (1 flight) 0  14. Standing for 1 hour 0  15.  sitting for 1 hour 1  16. Running on even ground 0  17. Running on uneven ground 0  18. Making sharp turns while running fast 0  19. Hopping  0  20. Rolling over in bed 2  Score total:  17/80     COGNITIVE STATUS: Within functional limits for tasks assessed   SENSATION: WFL  GAIT: EVAL: uses AD for long distances but not otherwise; short step length, mild antalgia on Rt.    Body Part #1 Hip  PALPATION: EVAL: soft end feel at 90 deg hip flexion, no s/s of infection at incision site- most lateral incision leaking upon changing bandages  EVAL: good quad and glut set, able to demo controlled prone hamstring curl                                                                                                                            TREATMENT DATE:    04/14/24 Upright bike w/n protocol ranges x 5 mins Seat 10 R hip PROM Hooklying adductor sqz 5 2x10 STM to lateral hip and ITB S/l clam 2x10 (improved tolerance after STM) LAQ 3# 2x10 Side stepping along rail x3 laps (unresisted) Tennis ball against wall (instruction for HEP)   04/12/24 Upright bike w/n protocol ranges x 5 mins Seat 10 R hip PROM Hooklying adductor sqz 5 2x5 STM to lateral hip and ITB  S/l clam 2x5 (improved tolerance after STM) LAQ 3# 2x10 Sit to stands from elevated plinth 2x10 HR 2x10 (more weight thorough RLE) Side stepping along rail x4 laps (unresisted) Standing marches x10    8/19 Pt received R hip PROM in flexion, abd, ER, and IR in supine per protocol ranges w/n pt and tissue tolerance.   Prone knee flexion 2x10 Q-ped rocking 2x10 Q-ped arm lifts 2x10 Hooklying bent knee fall outs with TrA w/n protocol range 2x10 PPT 2x10 Upright bike w/n protocol ranges x 5 mins  8/14  PROM R hip Adductor  squeezes 5 x20 Clams in hooklying x10 Supine clams unresisted x10 Pelvic tilts 5 x10 Prone IR/ER 2x10 Quadruped rocking fwd/back and lateral x10ea LAQ x10 (some anterior hip discomfort) Standing HR/TR 2x10   8/8  PROM R hip Review of precautions Glute squeezes 5 x20 Adductor squeezes 5 x20 Quad sets 5 2x10 Clams (sidelying) trialled but painful to stopped Supine clams unresisted x10 Hamstring sets 5  Pelvic tilts 5 2x10    PATIENT EDUCATION:  Education details:  PT instructed pt to call or message MD about work to find out if she is cleared to return to work.  Anatomy of condition, POC, HEP, exercise form/rationale Person educated: Patient Education method: Explanation, Demonstration, Tactile cues, Verbal cues, and Handouts Education comprehension: verbalized understanding, returned demonstration, verbal cues required, tactile cues required, and needs further education  HOME EXERCISE PROGRAM: Access Code: [email or web address] URL: [email or web address] Date: 03/10/2024 Prepared by: Harlene Cordon  Exercises - Lying Prone  - 3 x daily - 7 x weekly - 2-3 min hold - Prone Gluteal Sets  - 3 x daily - 7 x weekly - 1 sets - 10 reps - 3s hold - Prone Knee Flexion  - 3 x daily - 7 x weekly - 1 sets - 10 reps - Standing Weight Shift  - 5 x daily - 7 x weekly - 1 sets - 10 reps - Supine Transversus Abdominis Bracing - Hands on Ground  - 3 x daily - 7 x weekly - 1 sets - 10 reps - 3 breaths hold - Supine Bilateral Hamstring Sets  - 3 x daily - 7 x weekly - 1 sets - 10 reps - 5s hold   ASSESSMENT:  CLINICAL IMPRESSION:  Pt again had improved tolerance/performance for s/l clamshell exercise following STM to lateral hip. Discussed activity limitations/modifications while at work. Educated on self mobilization using tennis ball to lateral hip. Will continue to progress as tolerated.    REHAB POTENTIAL: Good  CLINICAL DECISION MAKING:  Stable/uncomplicated  EVALUATION COMPLEXITY: Low   GOALS: Goals reviewed with patient? Yes  SHORT TERM GOALS: Target date: 4 weeks 8/25   Pain free flexion to 90 Baseline: Goal status: INITIAL    2.  Demo controlled quad set with hold Baseline:  Goal status: MET 8/8   3.  30s sit to stand without pain or compensation Baseline:  Goal status: INITIAL    4.  SLS 30s without compensation Baseline:  Goal status: INITIAL   LONG TERM GOALS:   Able to demo step up on 6 step with level pelvis and proper form Baseline:  Goal status: INITIAL Week 8 9/22   2.  Will tolerate at least 3 min on elliptical, demonstrating good tolerance to repetitive weight bearing motion Baseline:  Goal status: INITIAL  Week 8 9/22   3.  Demonstrate proper form in at least 10 continuous lunges without increased pain Baseline:  Goal status: INITIAL  Week 12 11/3   4.  Demonstrate gentle, double and single foot plyometric motions with good proximal form Baseline:  Goal status: INITIAL Week 12 11/3   5.  Able to perform proper squat and sit<>stand to/from lower chairs that are present at work Baseline:  Goal status: INITIAL target date: POC date   6.  Able to return to [employer] (preferred workout) with understanding of proper progressions Baseline:  Goal status: INITIAL target date: POC date PLAN:  PT FREQUENCY: 1-2x/week  PT DURATION: POC date  PLANNED INTERVENTIONS: 97164- PT Re-evaluation, 97750- Physical Performance Testing, 97110-Therapeutic exercises, 97530- Therapeutic activity, 97112- Neuromuscular re-education, 97535- Self Care, 02859- Manual therapy, [id number]- Gait training, [phone]- Aquatic Therapy, [phone] (1-2 muscles), 20561 (3+ muscles)- Dry Needling, Patient/Family education, Balance training, Stair training, Taping, Joint mobilization, Spinal mobilization, Scar mobilization, and Cryotherapy.  PLAN FOR NEXT SESSION: per hip labral repair protocol   Asberry Rodes, PTA  04/14/24  5:04 PM

## 2024-04-18 ENCOUNTER — Encounter (INDEPENDENT_AMBULATORY_CARE_PROVIDER_SITE_OTHER): Payer: Self-pay | Admitting: Family Medicine

## 2024-04-18 ENCOUNTER — Ambulatory Visit (INDEPENDENT_AMBULATORY_CARE_PROVIDER_SITE_OTHER): Admitting: Family Medicine

## 2024-04-18 VITALS — BP 121/81 | HR 77 | Temp 97.9°F | Ht 61.0 in | Wt 165.0 lb

## 2024-04-18 DIAGNOSIS — F5089 Other specified eating disorder: Secondary | ICD-10-CM

## 2024-04-18 DIAGNOSIS — E7849 Other hyperlipidemia: Secondary | ICD-10-CM

## 2024-04-18 DIAGNOSIS — E66811 Obesity, class 1: Secondary | ICD-10-CM

## 2024-04-18 DIAGNOSIS — E559 Vitamin D deficiency, unspecified: Secondary | ICD-10-CM

## 2024-04-18 DIAGNOSIS — Z6831 Body mass index (BMI) 31.0-31.9, adult: Secondary | ICD-10-CM

## 2024-04-18 DIAGNOSIS — E669 Obesity, unspecified: Secondary | ICD-10-CM

## 2024-04-18 DIAGNOSIS — J069 Acute upper respiratory infection, unspecified: Secondary | ICD-10-CM

## 2024-04-18 DIAGNOSIS — R7303 Prediabetes: Secondary | ICD-10-CM | POA: Diagnosis not present

## 2024-04-18 DIAGNOSIS — F3289 Other specified depressive episodes: Secondary | ICD-10-CM

## 2024-04-18 MED ORDER — VITAMIN D (ERGOCALCIFEROL) 1.25 MG (50000 UNIT) PO CAPS: ORAL_CAPSULE | ORAL | 0 refills | Status: DC

## 2024-04-18 MED ORDER — BUPROPION HCL ER (SR) 150 MG PO TB12: 150.0000 mg | ORAL_TABLET | Freq: Two times a day (BID) | ORAL | 0 refills | Status: DC

## 2024-04-18 MED ORDER — ATORVASTATIN CALCIUM 10 MG PO TABS: 10.0000 mg | ORAL_TABLET | Freq: Every day | ORAL | 0 refills | Status: DC

## 2024-04-18 NOTE — Progress Notes (Signed)
 Office: [phone]  /  Fax: [phone]  WEIGHT SUMMARY AND BIOMETRICS  Anthropometric Measurements Height: 5' 1 (1.549 m) Weight: 165 lb (74.8 kg) BMI (Calculated): 31.19 Weight at Last Visit: 166 lb Weight Lost Since Last Visit: 1 lb Weight Gained Since Last Visit: 0 Starting Weight: 178 lb Total Weight Loss (lbs): 13 lb (5.897 kg) Peak Weight: 178 lb   Body Composition  Body Fat %: 37.3 % Fat Mass (lbs): 61.6 lbs Muscle Mass (lbs): 98.6 lbs Total Body Water (lbs): 73.2 lbs Visceral Fat Rating : 9   Other Clinical Data Fasting: no Labs: no Today's Visit #: 37 Starting Date: 11/06/20 Comments: cat 3    Chief Complaint: OBESITY   History of Present Illness Betty Porter is a 53 year old female with obesity who presents for obesity treatment and progress assessment.  She is adhering to the category three eating plan approximately 70% of the time, focusing on hydration, meeting protein recommendations, and consuming fruits and vegetables. She has lost one pound since her last visit on August 7th. She occasionally skips meals.  She is experiencing symptoms of a cold, including congestion, a scratchy throat, body aches, and fatigue. She believes she may have had a fever but did not confirm it with a thermometer. Despite these symptoms, she continues to work and attend appointments.  She is taking Lipitor 10 mg for hyperlipidemia, vitamin D , and bupropion  150 mg twice a day for emotional eating behaviors, with no reported side effects. She is also on metformin  500 mg twice a day for prediabetes. She is taking DayQuil for her cold symptoms.  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 She describes her new work environment as structured but misses the connection she had with colleagues at her previous job.      PHYSICAL EXAM:  Blood pressure 121/81, pulse 77, temperature 97.9 F (36.6 C), height  5' 1 (1.549 m), weight 165 lb (74.8 kg), SpO2 99%. Body mass index is 31.18 kg/m.  DIAGNOSTIC DATA REVIEWED:  BMET    Component Value Date/Time   NA 135 03/03/2024 1400   NA 139 12/09/2023 0919   K 4.0 03/03/2024 1400   CL 104 03/03/2024 1400   CO2 24 03/03/2024 1400   GLUCOSE 95 03/03/2024 1400   BUN 20 03/03/2024 1400   BUN 13 12/09/2023 0919   CREATININE 1.13 (H) 03/03/2024 1400   CALCIUM  9.9 03/03/2024 1400   GFRNONAA 59 (L) 03/03/2024 1400   Lab Results  Component Value Date   HGBA1C 5.5 12/09/2023   HGBA1C 5.7 (H) 11/06/2020   Lab Results  Component Value Date   INSULIN  15.2 12/09/2023   INSULIN  11.9 11/06/2020   Lab Results  Component Value Date   TSH 1.960 12/09/2023   CBC    Component Value Date/Time   WBC 6.1 11/06/2020 1216   WBC 5.7 09/10/2020 0917   RBC 4.46 11/06/2020 1216   RBC 4.50 09/10/2020 0917   HGB 13.0 11/06/2020 1216   HCT 38.8 11/06/2020 1216   PLT 299 11/06/2020 1216   MCV 87 11/06/2020 1216   MCH 29.1 11/06/2020 1216   MCH 29.3 09/10/2020 0917   MCHC 33.5 11/06/2020 1216   MCHC 33.1 09/10/2020 0917   RDW 13.1 11/06/2020 1216   Iron Studies No results found for: IRON, TIBC, FERRITIN, IRONPCTSAT Lipid Panel     Component Value Date/Time   CHOL 210 (H) 12/09/2023 0919   TRIG  125 12/09/2023 0919   HDL 61 12/09/2023 0919   LDLCALC 127 (H) 12/09/2023 0919   Hepatic Function Panel     Component Value Date/Time   PROT 6.9 12/09/2023 0919   ALBUMIN 4.4 12/09/2023 0919   AST 24 12/09/2023 0919   ALT 26 12/09/2023 0919   ALKPHOS 81 12/09/2023 0919   BILITOT 0.3 12/09/2023 0919      Component Value Date/Time   TSH 1.960 12/09/2023 0919   Nutritional Lab Results  Component Value Date   VD25OH 47.1 12/09/2023   VD25OH 40.6 07/08/2023   VD25OH 37.4 11/19/2022     Assessment and Plan Assessment & Plan Obesity Obesity with recent weight loss of 1 pound since last visit. Following category three eating plan  approximately 70% of the time. Working on hydration, protein intake, and consuming fruits and vegetables. Skipping meals occasionally. Metabolism may have decreased due to recent illness and surgery recovery. - Continue category three eating plan - Encourage hydration and meeting protein recommendations - Advise against skipping meals - Delay exercise until feeling 95% better  Other hyperlipidemia Hyperlipidemia managed with Lipitor 10 mg. No side effects reported. Continuing efforts with diet, exercise, and weight loss to manage lipid levels. - Refill Lipitor 10 mg  Prediabetes Prediabetes managed with metformin  500 mg twice daily. Good control with diet and exercise. Continuing efforts to prevent worsening of blood glucose levels. - Refill metformin  500 mg twice daily - Continue diet, exercise, and weight loss  Emotional Eating Behaviors Managed with Wellbutrin  SR 150 mg twice daily. No negative effects on blood pressure. Improvement in emotional eating behaviors noted. - Refill Wellbutrin  SR 150 mg twice daily  Vitamin D  deficiency, unspecified Vitamin D  deficiency managed with supplementation.  Acute upper respiratory infection Acute upper respiratory infection with symptoms of congestion, scratchy throat, body aches, and fatigue. Currently taking DayQuil for symptom management. Blood pressure remains stable despite decongestant use. - Continue DayQuil for symptom management - Encourage hydration and rest  Follow-Up Follow-up appointments discussed to manage ongoing health concerns and medication refills. - Schedule follow-up appointment in four weeks - Reschedule October 8th appointment if needed - Schedule November appointment   [name] was informed of the importance of frequent follow up visits to maximize her success with intensive lifestyle modifications for her obesity and obesity related health conditions as recommended by USPSTF and CMS guidelines   [name],  MD

## 2024-04-19 ENCOUNTER — Ambulatory Visit (HOSPITAL_BASED_OUTPATIENT_CLINIC_OR_DEPARTMENT_OTHER): Admitting: Physical Therapy

## 2024-04-21 ENCOUNTER — Ambulatory Visit (HOSPITAL_BASED_OUTPATIENT_CLINIC_OR_DEPARTMENT_OTHER)

## 2024-04-21 ENCOUNTER — Encounter (HOSPITAL_BASED_OUTPATIENT_CLINIC_OR_DEPARTMENT_OTHER): Payer: Self-pay

## 2024-04-21 DIAGNOSIS — M25551 Pain in right hip: Secondary | ICD-10-CM

## 2024-04-21 DIAGNOSIS — R262 Difficulty in walking, not elsewhere classified: Secondary | ICD-10-CM | POA: Diagnosis not present

## 2024-04-21 DIAGNOSIS — M6281 Muscle weakness (generalized): Secondary | ICD-10-CM

## 2024-04-21 NOTE — Therapy (Signed)
 OUTPATIENT PHYSICAL THERAPY TREATMENT   Patient Name: Betty Porter MRN: [id number] DOB:10-31-1970, 53 y.o., female Today's Date: 04/21/2024  END OF SESSION:  PT End of Session - 04/21/24 1606     Visit Number 7    Number of Visits 28    Date for PT Re-Evaluation 06/18/24    PT Start Time 1603    PT Stop Time 1641    PT Time Calculation (min) 38 min    Activity Tolerance Patient tolerated treatment well    Behavior During Therapy [facility] for tasks assessed/performed                Past Medical History:  Diagnosis Date   Acne    Adnexal mass    complex   Allergy 2010   Seasonal   Fibroid, uterine 2008   GERD (gastroesophageal reflux disease)    takes OTC meds   Hand swelling    Hyperlipidemia    Hypothyroidism    followed by pcp   Joint pain    Lactose intolerance    Lower extremity edema    PONV (postoperative nausea and vomiting)    Pre-diabetes    SUI (stress urinary incontinence, female)    Past Surgical History:  Procedure Laterality Date   CESAREAN SECTION  2009   LAPAROSCOPY  2008   for infertility   TONSILLECTOMY  1977   XI ROBOTIC ASSISTED OOPHORECTOMY N/A 09/12/2020   Procedure: XI ROBOTIC ASSISTED BILATERAL SALPINGECTOMY AND LEFT OOPHORECTOMY;  Surgeon: Viktoria Comer SAUNDERS, MD;  Location: [facility] [place];  Service: Gynecology;  Laterality: N/A;   Patient Active Problem List   Diagnosis Date Noted   Tear of right acetabular labrum 03/07/2024   Chronic back pain 12/09/2023   SOB (shortness of breath) on exertion 12/09/2023   Chronic right hip pain 06/22/2023   Perimenopause 04/01/2023   BMI 31.0-31.9,adult 09/18/2022   Insulin  resistance 06/18/2022   Generalized obesity 04/22/2022   Hypothyroidism 03/25/2022   Other hyperlipidemia 03/25/2022   Depression 03/25/2022   Mixed hyperlipidemia 02/25/2022   Acquired hypothyroidism 02/25/2022   Pre-diabetes 02/25/2022   Vitamin D  deficiency 12/18/2020   At risk for deficient  intake of food 12/18/2020   Elevated CA-125    Cyst of left ovary 08/31/2020   TMJ pain dysfunction syndrome 03/31/2017     REFERRING PROVIDER:  Genelle Standing, MD    REFERRING DIAG:  [phone] (ICD-10-CM) - Tear of right acetabular labrum, initial encounter    s/p Rt hip labral repair  Rationale for Evaluation and Treatment: Rehabilitation  THERAPY DIAG:  Muscle weakness (generalized)  Pain in right hip  Difficulty in walking, not elsewhere classified  ONSET DATE: DOS 03/07/24   SUBJECTIVE:  SUBJECTIVE STATEMENT: Pt reports having increased pain on Sunday, but feels better today.   PERTINENT HISTORY:  none  PAIN:  0/10 in R anterior hip  PRECAUTIONS:  None  RED FLAGS: None   WEIGHT BEARING RESTRICTIONS:  No  FALLS:  Has patient fallen in last 6 months? 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  PATIENT SURVEYS:  LEFS  Extreme difficulty/unable (0), Quite a bit of difficulty (1), Moderate difficulty (2), Little difficulty (3), No difficulty (4) Survey date:    Any of your usual work, housework or school activities 1  2. Usual hobbies, recreational or sporting activities 0  3. Getting into/out of the bath 1  4. Walking between rooms 3  5. Putting on socks/shoes 1  6. Squatting  1  7. Lifting an object, like a bag of groceries from the floor 2  8. Performing light activities around your home 2  9. Performing heavy activities around your home 0  10. Getting into/out of a car 2  11. Walking 2 blocks 1   12. Walking 1 mile 0  13. Going up/down 10 stairs (1 flight) 0  14. Standing for 1 hour 0  15.  sitting for 1 hour 1  16. Running on even ground 0  17. Running on uneven ground 0  18. Making sharp turns while running fast 0  19. Hopping  0  20. Rolling over in bed 2  Score total:  17/80     COGNITIVE STATUS: Within functional limits for tasks assessed   SENSATION: WFL  GAIT: EVAL: uses AD for long distances but not otherwise; short step length, mild antalgia on Rt.    Body Part #1 Hip  PALPATION: EVAL: soft end feel at 90 deg hip flexion, no s/s of infection at incision site- most lateral incision leaking upon changing bandages  EVAL: good quad and glut set, able to demo controlled prone hamstring curl                                                                                                                            TREATMENT DATE:    04/21/24 Upright bike w/n protocol ranges x 5 mins Seat 10 L4 STM to lateral hip and ITB S/l clam 2x10  LAQ 3# 2x10 Seated  marching Standing hip abduction x10ea Standing hip extension x10ea Sit to stands 2x10 Gait in hall for observation Standing hip flexor stretch for L hip HEP update and review  04/14/24 Upright bike w/n protocol ranges x 5 mins Seat 10 R hip PROM Hooklying adductor sqz 5 2x10 STM to lateral hip and ITB S/l clam 2x10 (improved tolerance after STM) LAQ 3# 2x10 Side stepping along rail  x3 laps (unresisted) Tennis ball against wall (instruction for HEP)    PATIENT EDUCATION:  Education details:  PT instructed pt to call or message MD about work to find out if she is cleared to return to work.  Anatomy of condition, POC, HEP, exercise form/rationale Person educated: Patient Education method: Explanation, Demonstration, Tactile cues, Verbal cues, and Handouts Education comprehension: verbalized understanding, returned demonstration, verbal cues required, tactile cues required, and needs further  education  HOME EXERCISE PROGRAM: Access Code: [email or web address] URL: [email or web address] Date: 03/10/2024 Prepared by: Harlene Cordon  Exercises - Lying Prone  - 3 x daily - 7 x weekly - 2-3 min hold - Prone Gluteal Sets  - 3 x daily - 7 x weekly - 1 sets - 10 reps - 3s hold - Prone Knee Flexion  - 3 x daily - 7 x weekly - 1 sets - 10 reps - Standing Weight Shift  - 5 x daily - 7 x weekly - 1 sets - 10 reps - Supine Transversus Abdominis Bracing - Hands on Ground  - 3 x daily - 7 x weekly - 1 sets - 10 reps - 3 breaths hold - Supine Bilateral Hamstring Sets  - 3 x daily - 7 x weekly - 1 sets - 10 reps - 5s hold   ASSESSMENT:  CLINICAL IMPRESSION:  Pt denied pain/discomfort today with s/l clams for the first time in PT. Continued with STM to address lateral gluteal tightness still present. Observed slight decrease in L hip extension with gait, so added hip flexor stretch to improve this. Able to tolerate seated marching to work on hip flexor strengthening as well. Updated and reviewed HEP with pt. She will be missing the next 2 weeks of PT due to travelling.   REHAB POTENTIAL: Good  CLINICAL DECISION MAKING: Stable/uncomplicated  EVALUATION COMPLEXITY: Low   GOALS: Goals reviewed with patient? Yes  SHORT TERM GOALS: Target date: 4 weeks 8/25   Pain free flexion to 90 Baseline: Goal status: INITIAL    2.  Demo controlled quad set with hold Baseline:  Goal status: MET 8/8   3.  30s sit to stand without pain or compensation Baseline:  Goal status: INITIAL    4.  SLS 30s without compensation Baseline:  Goal status: INITIAL   LONG TERM GOALS:   Able to demo step up on 6 step with level pelvis and proper form Baseline:  Goal status: INITIAL Week 8 9/22   2.  Will tolerate at least 3 min on elliptical, demonstrating good tolerance to repetitive weight bearing motion Baseline:  Goal status: INITIAL  Week 8 9/22   3.  Demonstrate proper form in at least  10 continuous lunges without increased pain Baseline:  Goal status: INITIAL  Week 12 11/3   4.  Demonstrate gentle, double and single foot plyometric motions with good proximal form Baseline:  Goal status: INITIAL Week 12 11/3   5.  Able to perform proper squat and sit<>stand to/from lower chairs that are present at work Baseline:  Goal status: INITIAL target date: POC date   6.  Able to return to [employer] (preferred workout) with understanding of proper progressions Baseline:  Goal status: INITIAL target date: POC date PLAN:  PT FREQUENCY: 1-2x/week  PT DURATION: POC date  PLANNED INTERVENTIONS: 97164- PT Re-evaluation, 97750- Physical Performance Testing, 97110-Therapeutic exercises, 97530- Therapeutic activity, [id number]- Neuromuscular re-education, 97535- Self Care, 02859- Manual therapy, [id number]- Gait training, [id number]- Aquatic Therapy, [phone] (1-2 muscles), 20561 (3+ muscles)- Dry Needling,  Patient/Family education, Balance training, Stair training, Taping, Joint mobilization, Spinal mobilization, Scar mobilization, and Cryotherapy.  PLAN FOR NEXT SESSION: per hip labral repair protocol   Asberry Rodes, PTA  04/21/24 4:46 PM

## 2024-04-21 NOTE — Therapy (Deleted)
 [facility] Outpatient Rehabilitation at [facility] [address] [place], [place], [address] Phone: [phone]   Fax:  [phone]  Patient Details  Name: Betty Porter MRN: [id number] Date of Birth: October 06, 1970 Referring Provider:  Genelle Standing, MD  Encounter Date: 04/21/2024   Asberry FORBES Rodes, PTA 04/21/2024, 3:43 PM  [facility] Outpatient Rehabilitation at [facility] [address] [place], [place], [address] Phone: [phone]   Fax:  [phone]

## 2024-04-26 ENCOUNTER — Encounter (HOSPITAL_BASED_OUTPATIENT_CLINIC_OR_DEPARTMENT_OTHER): Admitting: Physical Therapy

## 2024-04-27 ENCOUNTER — Encounter (HOSPITAL_BASED_OUTPATIENT_CLINIC_OR_DEPARTMENT_OTHER): Admitting: Orthopaedic Surgery

## 2024-04-28 ENCOUNTER — Encounter (HOSPITAL_BASED_OUTPATIENT_CLINIC_OR_DEPARTMENT_OTHER)

## 2024-04-30 ENCOUNTER — Other Ambulatory Visit (INDEPENDENT_AMBULATORY_CARE_PROVIDER_SITE_OTHER): Payer: Self-pay | Admitting: Family Medicine

## 2024-04-30 DIAGNOSIS — E7849 Other hyperlipidemia: Secondary | ICD-10-CM

## 2024-05-03 ENCOUNTER — Encounter (HOSPITAL_BASED_OUTPATIENT_CLINIC_OR_DEPARTMENT_OTHER): Admitting: Physical Therapy

## 2024-05-06 ENCOUNTER — Encounter (HOSPITAL_BASED_OUTPATIENT_CLINIC_OR_DEPARTMENT_OTHER): Admitting: Physical Therapy

## 2024-05-10 ENCOUNTER — Encounter (HOSPITAL_BASED_OUTPATIENT_CLINIC_OR_DEPARTMENT_OTHER): Payer: Self-pay

## 2024-05-10 ENCOUNTER — Ambulatory Visit (HOSPITAL_BASED_OUTPATIENT_CLINIC_OR_DEPARTMENT_OTHER)

## 2024-05-10 DIAGNOSIS — M6281 Muscle weakness (generalized): Secondary | ICD-10-CM

## 2024-05-10 DIAGNOSIS — R262 Difficulty in walking, not elsewhere classified: Secondary | ICD-10-CM

## 2024-05-10 DIAGNOSIS — M25551 Pain in right hip: Secondary | ICD-10-CM

## 2024-05-10 NOTE — Therapy (Signed)
 OUTPATIENT PHYSICAL THERAPY TREATMENT   Patient Name: Betty Porter MRN: [id number] DOB:11-02-1970, 53 y.o., female Today's Date: 05/10/2024  END OF SESSION:  PT End of Session - 05/10/24 1602     Visit Number 8    Number of Visits 28    Date for Recertification  06/18/24    PT Start Time 1603    PT Stop Time 1645    PT Time Calculation (min) 42 min    Activity Tolerance Patient tolerated treatment well    Behavior During Therapy [facility] for tasks assessed/performed                 Past Medical History:  Diagnosis Date   Acne    Adnexal mass    complex   Allergy 2010   Seasonal   Fibroid, uterine 2008   GERD (gastroesophageal reflux disease)    takes OTC meds   Hand swelling    Hyperlipidemia    Hypothyroidism    followed by pcp   Joint pain    Lactose intolerance    Lower extremity edema    PONV (postoperative nausea and vomiting)    Pre-diabetes    SUI (stress urinary incontinence, female)    Past Surgical History:  Procedure Laterality Date   CESAREAN SECTION  2009   LAPAROSCOPY  2008   for infertility   TONSILLECTOMY  1977   XI ROBOTIC ASSISTED OOPHORECTOMY N/A 09/12/2020   Procedure: XI ROBOTIC ASSISTED BILATERAL SALPINGECTOMY AND LEFT OOPHORECTOMY;  Surgeon: Viktoria Comer SAUNDERS, MD;  Location: [facility];  Service: Gynecology;  Laterality: N/A;   Patient Active Problem List   Diagnosis Date Noted   Tear of right acetabular labrum 03/07/2024   Chronic back pain 12/09/2023   SOB (shortness of breath) on exertion 12/09/2023   Chronic right hip pain 06/22/2023   Perimenopause 04/01/2023   BMI 31.0-31.9,adult 09/18/2022   Insulin  resistance 06/18/2022   Generalized obesity 04/22/2022   Hypothyroidism 03/25/2022   Other hyperlipidemia 03/25/2022   Depression 03/25/2022   Mixed hyperlipidemia 02/25/2022   Acquired hypothyroidism 02/25/2022   Pre-diabetes 02/25/2022   Vitamin D  deficiency 12/18/2020   At risk for deficient  intake of food 12/18/2020   Elevated CA-125    Cyst of left ovary 08/31/2020   TMJ pain dysfunction syndrome 03/31/2017     REFERRING PROVIDER:  Genelle Standing, MD    REFERRING DIAG:  [phone] (ICD-10-CM) - Tear of right acetabular labrum, initial encounter    s/p Rt hip labral repair  Rationale for Evaluation and Treatment: Rehabilitation  THERAPY DIAG:  Muscle weakness (generalized)  Pain in right hip  Difficulty in walking, not elsewhere classified  ONSET DATE: DOS 03/07/24   SUBJECTIVE:  SUBJECTIVE STATEMENT: Pt reports increased low back pain since her trip to [place].  PERTINENT HISTORY:  none  PAIN:  0/10 in R anterior hip  PRECAUTIONS:  None  RED FLAGS: None   WEIGHT BEARING RESTRICTIONS:  No  FALLS:  Has patient fallen in last 6 months? 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  PATIENT SURVEYS:  LEFS  Extreme difficulty/unable (0), Quite a bit of difficulty (1), Moderate difficulty (2), Little difficulty (3), No difficulty (4) Survey date:    Any of your usual work, housework or school activities 1  2. Usual hobbies, recreational or sporting activities 0  3. Getting into/out of the bath 1  4. Walking between rooms 3  5. Putting on socks/shoes 1  6. Squatting  1  7. Lifting an object, like a bag of groceries from the floor 2  8. Performing light activities around your home 2  9. Performing heavy activities around your home 0  10. Getting into/out of a car 2  11. Walking 2 blocks 1  12.  Walking 1 mile 0  13. Going up/down 10 stairs (1 flight) 0  14. Standing for 1 hour 0  15.  sitting for 1 hour 1  16. Running on even ground 0  17. Running on uneven ground 0  18. Making sharp turns while running fast 0  19. Hopping  0  20. Rolling over in bed 2  Score total:  17/80     COGNITIVE STATUS: Within functional limits for tasks assessed   SENSATION: WFL  GAIT: EVAL: uses AD for long distances but not otherwise; short step length, mild antalgia on Rt.    Body Part #1 Hip  PALPATION: EVAL: soft end feel at 90 deg hip flexion, no s/s of infection at incision site- most lateral incision leaking upon changing bandages  EVAL: good quad and glut set, able to demo controlled prone hamstring curl                                                                                                                            TREATMENT DATE:   05/10/24 Upright bike x 5 mins Seat 10 L4 Lumbar flexion stretch seated Seated fig 4 stretch 3x30sec each Seated HSS 30sec x3ea 1/2 kneel hip flexor stretch LTR 5 x10 S/l clam 2x10  Sit to stands 3x10 Gait in hall   04/21/24 Upright bike w/n protocol ranges x 5 mins Seat 10 L4 STM to lateral hip and ITB S/l clam 2x10  LAQ 3# 2x10 Seated  marching Standing hip abduction x10ea Standing hip extension x10ea Sit to stands 2x10 Gait in hall for observation Standing hip flexor stretch for L hip HEP update  and review  04/14/24 Upright bike w/n protocol ranges x 5 mins Seat 10 R hip PROM Hooklying adductor sqz 5 2x10 STM to lateral hip and ITB S/l clam 2x10 (improved tolerance after STM) LAQ 3# 2x10 Side stepping along rail x3 laps (unresisted) Tennis ball against wall (instruction for HEP)    PATIENT EDUCATION:  Education details:  PT instructed pt to call or message MD about work to find out if she is cleared to return to work.  Anatomy of condition, POC, HEP, exercise form/rationale Person educated: Patient Education  method: Explanation, Demonstration, Tactile cues, Verbal cues, and Handouts Education comprehension: verbalized understanding, returned demonstration, verbal cues required, tactile cues required, and needs further education  HOME EXERCISE PROGRAM: Access Code: [email or web address] URL: [email or web address] Date: 03/10/2024 Prepared by: Harlene Cordon  Exercises - Lying Prone  - 3 x daily - 7 x weekly - 2-3 min hold - Prone Gluteal Sets  - 3 x daily - 7 x weekly - 1 sets - 10 reps - 3s hold - Prone Knee Flexion  - 3 x daily - 7 x weekly - 1 sets - 10 reps - Standing Weight Shift  - 5 x daily - 7 x weekly - 1 sets - 10 reps - Supine Transversus Abdominis Bracing - Hands on Ground  - 3 x daily - 7 x weekly - 1 sets - 10 reps - 3 breaths hold - Supine Bilateral Hamstring Sets  - 3 x daily - 7 x weekly - 1 sets - 10 reps - 5s hold   ASSESSMENT:  CLINICAL IMPRESSION:  Decreased intensity of exercises today and focused on stretching to alleviate discomfort in hip and low back. She was able to complete s/l calm bilaterally without c/o pain. With standing hip adductor stretching, she c/o lateral L hip pain, so discontinued. Felt strong stretch with seated HSS as well as 1/2 kneel hip flexor stretch. Advised in activity modification until pain level improves. Pt has f/u with surgeon tomorrow.   REHAB POTENTIAL: Good  CLINICAL DECISION MAKING: Stable/uncomplicated  EVALUATION COMPLEXITY: Low   GOALS: Goals reviewed with patient? Yes  SHORT TERM GOALS: Target date: 4 weeks 8/25   Pain free flexion to 90 Baseline: Goal status: INITIAL    2.  Demo controlled quad set with hold Baseline:  Goal status: MET 8/8   3.  30s sit to stand without pain or compensation Baseline:  Goal status: INITIAL    4.  SLS 30s without compensation Baseline:  Goal status: INITIAL   LONG TERM GOALS:   Able to demo step up on 6 step with level pelvis and proper form Baseline:  Goal status:  INITIAL Week 8 9/22   2.  Will tolerate at least 3 min on elliptical, demonstrating good tolerance to repetitive weight bearing motion Baseline:  Goal status: INITIAL  Week 8 9/22   3.  Demonstrate proper form in at least 10 continuous lunges without increased pain Baseline:  Goal status: INITIAL  Week 12 11/3   4.  Demonstrate gentle, double and single foot plyometric motions with good proximal form Baseline:  Goal status: INITIAL Week 12 11/3   5.  Able to perform proper squat and sit<>stand to/from lower chairs that are present at work Baseline:  Goal status: INITIAL target date: POC date   6.  Able to return to [employer] (preferred workout) with understanding of proper progressions Baseline:  Goal status: INITIAL target date: POC date PLAN:  PT FREQUENCY: 1-2x/week  PT DURATION: POC  date  PLANNED INTERVENTIONS: 97164- PT Re-evaluation, 97750- Physical Performance Testing, 97110-Therapeutic exercises, 97530- Therapeutic activity, [id number]- Neuromuscular re-education, [phone]- Self Care, 02859- Manual therapy, [phone]- Gait training, [phone]- Aquatic Therapy, [phone] (1-2 muscles), [id number] (3+ muscles)- Dry Needling, Patient/Family education, Balance training, Stair training, Taping, Joint mobilization, Spinal mobilization, Scar mobilization, and Cryotherapy.  PLAN FOR NEXT SESSION: per hip labral repair protocol   Asberry Rodes, PTA  05/10/24 5:12 PM

## 2024-05-11 ENCOUNTER — Ambulatory Visit (INDEPENDENT_AMBULATORY_CARE_PROVIDER_SITE_OTHER): Admitting: Orthopaedic Surgery

## 2024-05-11 DIAGNOSIS — M25552 Pain in left hip: Secondary | ICD-10-CM

## 2024-05-11 DIAGNOSIS — S73191A Other sprain of right hip, initial encounter: Secondary | ICD-10-CM

## 2024-05-11 NOTE — Progress Notes (Signed)
 Post Operative Evaluation    Procedure/Date of Surgery: right hip labral repair 7/28 Interval History:    Presents today 6-week status post above procedure.  Overall she is doing well.  She is occasionally having some soreness with a swift movement or change in direction although this is continuing to improve as well.  She is still having symptoms on the left side.  This has been persistent as well similar to the contralateral side.  She has been working with rehab on this as well  PMH/PSH/Family History/Social History/Meds/Allergies:    Past Medical History:  Diagnosis Date   Acne    Adnexal mass    complex   Allergy 2010   Seasonal   Fibroid, uterine 2008   GERD (gastroesophageal reflux disease)    takes OTC meds   Hand swelling    Hyperlipidemia    Hypothyroidism    followed by pcp   Joint pain    Lactose intolerance    Lower extremity edema    PONV (postoperative nausea and vomiting)    Pre-diabetes    SUI (stress urinary incontinence, female)    Past Surgical History:  Procedure Laterality Date   CESAREAN SECTION  2009   LAPAROSCOPY  2008   for infertility   TONSILLECTOMY  1977   XI ROBOTIC ASSISTED OOPHORECTOMY N/A 09/12/2020   Procedure: XI ROBOTIC ASSISTED BILATERAL SALPINGECTOMY AND LEFT OOPHORECTOMY;  Surgeon: Viktoria Comer SAUNDERS, MD;  Location: [facility];  Service: Gynecology;  Laterality: N/A;   Social History   Socioeconomic History   Marital status: Widowed    Spouse name: Not on file   Number of children: Not on file   Years of education: Not on file   Highest education level: Associate degree: occupational, Scientist, product/process development, or vocational program  Occupational History   Occupation: Chiropractor  Tobacco Use   Smoking status: Former    Current packs/day: 0.00    Average packs/day: 0.8 packs/day for 10.0 years (7.5 ttl pk-yrs)    Types: Cigarettes    Start date: 08/11/1986    Quit date:  08/11/1996    Years since quitting: 27.7   Smokeless tobacco: Never  Vaping Use   Vaping status: Never Used  Substance and Sexual Activity   Alcohol use: Not Currently   Drug use: Never   Sexual activity: Not Currently    Birth control/protection: None, Surgical    Comment: bil salping, left ooph  Other Topics Concern   Not on file  Social History Narrative   Not on file   Social Drivers of Health   Financial Resource Strain: Low Risk  (06/18/2023)   Overall Financial Resource Strain (CARDIA)    Difficulty of Paying Living Expenses: Not hard at all  Food Insecurity: No Food Insecurity (06/18/2023)   Hunger Vital Sign    Worried About Running Out of Food in the Last Year: Never true    Ran Out of Food in the Last Year: Never true  Transportation Needs: No Transportation Needs (06/18/2023)   PRAPARE - Administrator, Civil Service (Medical): No    Lack of Transportation (Non-Medical): No  Physical Activity: Sufficiently Active (06/18/2023)   Exercise Vital Sign    Days of Exercise per Week: 3 days    Minutes of Exercise per Session: 60 min  Stress: No Stress Concern Present (06/18/2023)   [employer] of Occupational Health - Occupational Stress Questionnaire    Feeling of Stress : Only a little  Social Connections: Socially Isolated (06/18/2023)   Social Connection and Isolation Panel    Frequency of Communication with Friends and Family: Three times a week    Frequency of Social Gatherings with Friends and Family: Never    Attends Religious Services: Never    Database administrator or Organizations: No    Attends Engineer, structural: Not on file    Marital Status: Widowed   Family History  Problem Relation Age of Onset   Breast cancer Maternal Aunt    Cancer Maternal Aunt    Hypertension Mother    Osteoporosis Mother    Arthritis Mother    Diabetes Father    Hyperlipidemia Father    Obesity Father    Thyroid  cancer Maternal Aunt    Leukemia  Maternal Uncle    Diabetes Paternal Grandfather    Cancer Maternal Uncle    Colon cancer Neg Hx    Ovarian cancer Neg Hx    Endometrial cancer Neg Hx    Pancreatic cancer Neg Hx    Prostate cancer Neg Hx    No Known Allergies Current Outpatient Medications  Medication Sig Dispense Refill   aspirin  EC 325 MG tablet TAKE 1 TABLET BY MOUTH DAILY 14 tablet 0   atorvastatin  (LIPITOR) 10 MG tablet Take 1 tablet (10 mg total) by mouth at bedtime. 90 tablet 0   buPROPion  (WELLBUTRIN  SR) 150 MG 12 hr tablet Take 1 tablet (150 mg total) by mouth 2 (two) times daily. 180 tablet 0   gemfibrozil  (LOPID ) 600 MG tablet Take 1 tablet (600 mg total) by mouth in the morning and at bedtime. 90 tablet 0   levothyroxine  (SYNTHROID ) 75 MCG tablet Take 1 tablet (75 mcg total) by mouth daily before breakfast. 90 tablet 0   metFORMIN  (GLUCOPHAGE ) 500 MG tablet Take 1 tablet (500 mg total) by mouth 2 (two) times daily with a meal. 180 tablet 0   oxyCODONE  (ROXICODONE ) 5 MG immediate release tablet Take 1 tablet (5 mg total) by mouth every 4 (four) hours as needed for severe pain (pain score 7-10) or breakthrough pain. 10 tablet 0   Vitamin D , Ergocalciferol , (DRISDOL ) 1.25 MG (50000 UNIT) CAPS capsule TAKE ONE CAPSULE BY MOUTH ONCE WEEKLY (EVERY 7 DAYS) 13 capsule 0   No current facility-administered medications for this visit.   No results found.  Review of Systems:   A ROS was performed including pertinent positives and negatives as documented in the HPI.   Musculoskeletal Exam:    There were no vitals taken for this visit.  Right hip incisions are well-appearing without erythema or drainage.  Walks without antalgic gait with a mildly shortened stance phase.  Distal neurosensory exam is intact  Left hip with positive FADIR and 30 degrees internal rotation 90 degrees flexion external rotation 45 degrees improves this.  Mild weakness with abduction strength testing.  Remainder of distal neurosensory exams  intact  Imaging:    3 views left hip: Normal  I personally reviewed and interpreted the radiographs.   Assessment:   Status post right hip labral repair doing well.  At this time she is still having left hip based pain and difficulty ambulating despite physical therapy.  She is still having pain deep in the groin.  Given this and her known history of contralateral labral tear we  will plan to proceed with an MRI of her left hip and follow-up discuss results.  We also plan to place a referral for physical therapy for dedicated back strengthening  Plan :    - Return to clinic 1 month for discussion of left hip MRI      I personally saw and evaluated the patient, and participated in the management and treatment plan.  Elspeth Parker, MD Attending Physician, Orthopedic Surgery  This document was dictated using Dragon voice recognition software. A reasonable attempt at proof reading has been made to minimize errors.

## 2024-05-11 NOTE — Addendum Note (Signed)
 Addended by: WOLFGANG CONLEY HERO on: 05/11/2024 04:10 PM   Modules accepted: Orders

## 2024-05-12 ENCOUNTER — Ambulatory Visit (HOSPITAL_BASED_OUTPATIENT_CLINIC_OR_DEPARTMENT_OTHER): Attending: Orthopaedic Surgery | Admitting: Physical Therapy

## 2024-05-12 ENCOUNTER — Encounter (HOSPITAL_BASED_OUTPATIENT_CLINIC_OR_DEPARTMENT_OTHER): Admitting: Physical Therapy

## 2024-05-12 DIAGNOSIS — R262 Difficulty in walking, not elsewhere classified: Secondary | ICD-10-CM | POA: Insufficient documentation

## 2024-05-12 DIAGNOSIS — S73191A Other sprain of right hip, initial encounter: Secondary | ICD-10-CM | POA: Insufficient documentation

## 2024-05-12 DIAGNOSIS — M25552 Pain in left hip: Secondary | ICD-10-CM | POA: Insufficient documentation

## 2024-05-12 DIAGNOSIS — M25551 Pain in right hip: Secondary | ICD-10-CM | POA: Insufficient documentation

## 2024-05-12 DIAGNOSIS — M6281 Muscle weakness (generalized): Secondary | ICD-10-CM | POA: Insufficient documentation

## 2024-05-14 ENCOUNTER — Other Ambulatory Visit

## 2024-05-17 ENCOUNTER — Ambulatory Visit (HOSPITAL_BASED_OUTPATIENT_CLINIC_OR_DEPARTMENT_OTHER)

## 2024-05-17 ENCOUNTER — Encounter (HOSPITAL_BASED_OUTPATIENT_CLINIC_OR_DEPARTMENT_OTHER): Payer: Self-pay

## 2024-05-17 DIAGNOSIS — M25552 Pain in left hip: Secondary | ICD-10-CM | POA: Diagnosis present

## 2024-05-17 DIAGNOSIS — M6281 Muscle weakness (generalized): Secondary | ICD-10-CM

## 2024-05-17 DIAGNOSIS — M25551 Pain in right hip: Secondary | ICD-10-CM | POA: Diagnosis present

## 2024-05-17 DIAGNOSIS — R262 Difficulty in walking, not elsewhere classified: Secondary | ICD-10-CM | POA: Diagnosis present

## 2024-05-17 DIAGNOSIS — S73191A Other sprain of right hip, initial encounter: Secondary | ICD-10-CM | POA: Diagnosis present

## 2024-05-17 NOTE — Therapy (Signed)
 OUTPATIENT PHYSICAL THERAPY TREATMENT   Patient Name: Betty Porter MRN: [id number] DOB:[birth year], 53 y.o., female Today's Date: 05/17/2024  END OF SESSION:  PT End of Session - 05/17/24 1608     Visit Number 9    Number of Visits 28    Date for Recertification  06/18/24    PT Start Time 1600    PT Stop Time 1649    PT Time Calculation (min) 49 min    Activity Tolerance Patient tolerated treatment well    Behavior During Therapy [facility] for tasks assessed/performed                  Past Medical History:  Diagnosis Date   Acne    Adnexal mass    complex   Allergy 2010   Seasonal   Fibroid, uterine 2008   GERD (gastroesophageal reflux disease)    takes OTC meds   Hand swelling    Hyperlipidemia    Hypothyroidism    followed by pcp   Joint pain    Lactose intolerance    Lower extremity edema    PONV (postoperative nausea and vomiting)    Pre-diabetes    SUI (stress urinary incontinence, female)    Past Surgical History:  Procedure Laterality Date   CESAREAN SECTION  2009   LAPAROSCOPY  2008   for infertility   TONSILLECTOMY  1977   XI ROBOTIC ASSISTED OOPHORECTOMY N/A 09/12/2020   Procedure: XI ROBOTIC ASSISTED BILATERAL SALPINGECTOMY AND LEFT OOPHORECTOMY;  Surgeon: Viktoria Comer SAUNDERS, MD;  Location: [facility];  Service: Gynecology;  Laterality: N/A;   Patient Active Problem List   Diagnosis Date Noted   Tear of right acetabular labrum 03/07/2024   Chronic back pain 12/09/2023   SOB (shortness of breath) on exertion 12/09/2023   Chronic right hip pain 06/22/2023   Perimenopause 04/01/2023   BMI 31.0-31.9,adult 09/18/2022   Insulin  resistance 06/18/2022   Generalized obesity 04/22/2022   Hypothyroidism 03/25/2022   Other hyperlipidemia 03/25/2022   Depression 03/25/2022   Mixed hyperlipidemia 02/25/2022   Acquired hypothyroidism 02/25/2022   Pre-diabetes 02/25/2022   Vitamin D  deficiency 12/18/2020   At risk for deficient  intake of food 12/18/2020   Elevated CA-125    Cyst of left ovary 08/31/2020   TMJ pain dysfunction syndrome 03/31/2017     REFERRING PROVIDER:  Genelle Standing, MD    REFERRING DIAG:  [phone] (ICD-10-CM) - Tear of right acetabular labrum, initial encounter    s/p Rt hip labral repair  Rationale for Evaluation and Treatment: Rehabilitation  THERAPY DIAG:  Muscle weakness (generalized)  Pain in right hip  Difficulty in walking, not elsewhere classified  ONSET DATE: DOS 03/07/24   SUBJECTIVE:  SUBJECTIVE STATEMENT: Pt reports ongoing discomfort in R hip with activity. Overall better though. Back pain has improved since last visit.   PERTINENT HISTORY:  none  PAIN:  0/10 in R anterior hip  PRECAUTIONS:  None  RED FLAGS: None   WEIGHT BEARING RESTRICTIONS:  No  FALLS:  Has patient fallen in last 6 months? 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  PATIENT SURVEYS:  LEFS  Extreme difficulty/unable (0), Quite a bit of difficulty (1), Moderate difficulty (2), Little difficulty (3), No difficulty (4) Survey date:    Any of your usual work, housework or school activities 1  2. Usual hobbies, recreational or sporting activities 0  3. Getting into/out of the bath 1  4. Walking between rooms 3  5. Putting on socks/shoes 1  6. Squatting  1  7. Lifting an object, like a bag of groceries from the floor 2  8. Performing light activities around your home 2  9. Performing heavy activities around your home 0  10.  Getting into/out of a car 2  11. Walking 2 blocks 1  12. Walking 1 mile 0  13. Going up/down 10 stairs (1 flight) 0  14. Standing for 1 hour 0  15.  sitting for 1 hour 1  16. Running on even ground 0  17. Running on uneven ground 0  18. Making sharp turns while running fast 0  19. Hopping  0  20. Rolling over in bed 2  Score total:  17/80     COGNITIVE STATUS: Within functional limits for tasks assessed   SENSATION: WFL  GAIT: EVAL: uses AD for long distances but not otherwise; short step length, mild antalgia on Rt.    Body Part #1 Hip  PALPATION: EVAL: soft end feel at 90 deg hip flexion, no s/s of infection at incision site- most lateral incision leaking upon changing bandages  EVAL: good quad and glut set, able to demo controlled prone hamstring curl                                                                                                                            TREATMENT DATE:   05/17/24 Upright bike x 5 mins Seat 10 L4 PROM R hip Mullligan mobilizations for R hip distraction and lateral glides STM to anterior hip/lateral hip/adductors LTR 5 x10 S/l clam 2x10  Passive HSS in supine 3x30sec LAQ (had pain in anterior hip, improved after prone quad stretching) Prone quad stretching with strap   05/10/24 Upright bike x 5 mins Seat 10 L4 Lumbar flexion stretch seated Seated fig 4 stretch 3x30sec each Seated HSS 30sec x3ea 1/2 kneel hip flexor stretch LTR 5 x10 S/l clam  2x10  Sit to stands 3x10 Gait in hall   04/21/24 Upright bike w/n protocol ranges x 5 mins Seat 10 L4 STM to lateral hip and ITB S/l clam 2x10  LAQ 3# 2x10 Seated  marching Standing hip abduction x10ea Standing hip extension x10ea Sit to stands 2x10 Gait in hall for observation Standing hip flexor stretch for L hip HEP update and review  04/14/24 Upright bike w/n protocol ranges x 5 mins Seat 10 R hip PROM Hooklying adductor sqz 5 2x10 STM to lateral hip and ITB S/l clam  2x10 (improved tolerance after STM) LAQ 3# 2x10 Side stepping along rail x3 laps (unresisted) Tennis ball against wall (instruction for HEP)    PATIENT EDUCATION:  Education details:  PT instructed pt to call or message MD about work to find out if she is cleared to return to work.  Anatomy of condition, POC, HEP, exercise form/rationale Person educated: Patient Education method: Explanation, Demonstration, Tactile cues, Verbal cues, and Handouts Education comprehension: verbalized understanding, returned demonstration, verbal cues required, tactile cues required, and needs further education  HOME EXERCISE PROGRAM: Access Code: [email or web address] URL: [email or web address] Date: 03/10/2024 Prepared by: Harlene Cordon  Exercises - Lying Prone  - 3 x daily - 7 x weekly - 2-3 min hold - Prone Gluteal Sets  - 3 x daily - 7 x weekly - 1 sets - 10 reps - 3s hold - Prone Knee Flexion  - 3 x daily - 7 x weekly - 1 sets - 10 reps - Standing Weight Shift  - 5 x daily - 7 x weekly - 1 sets - 10 reps - Supine Transversus Abdominis Bracing - Hands on Ground  - 3 x daily - 7 x weekly - 1 sets - 10 reps - 3 breaths hold - Supine Bilateral Hamstring Sets  - 3 x daily - 7 x weekly - 1 sets - 10 reps - 5s hold   ASSESSMENT:  CLINICAL IMPRESSION:  Patient continues to complain of anterior pain which is present with passive range of motion especially into hip flexion and horizontal adduction.  Patient very tender to groin area just medial to the ASIS.  Spent time on STM to this area as well as bilateral hip flexors and greater trochanter area.  Patient also experiencing true pain with seated LAQ.  This did improve significantly following prone quad stretch.  Instructed patient to perform this at home.  Patient also notably tight into hamstrings.  She had improved pain level with hip flexion passively following hamstring stretching.  Trialed Mulligan mobilizations with bouts to see if this improved  patient's mobility with minimal success.  Instructed patient in self STM/IASTM to address tightness in quads and anterior hip.  Instructed patient to continue with heat/ice as needed for pain relief.  Will continue to monitor pain level and tightness as we progress.  REHAB POTENTIAL: Good  CLINICAL DECISION MAKING: Stable/uncomplicated  EVALUATION COMPLEXITY: Low   GOALS: Goals reviewed with patient? Yes  SHORT TERM GOALS: Target date: 4 weeks 8/25   Pain free flexion to 90 Baseline: Goal status: INITIAL    2.  Demo controlled quad set with hold Baseline:  Goal status: MET 8/8   3.  30s sit to stand without pain or compensation Baseline:  Goal status: INITIAL    4.  SLS 30s without compensation Baseline:  Goal status: INITIAL   LONG TERM GOALS:   Able to demo step up on 6 step with level pelvis and proper form Baseline:  Goal status: INITIAL Week 8 9/22   2.  Will tolerate at least 3 min on elliptical, demonstrating good tolerance to repetitive weight bearing motion Baseline:  Goal status: INITIAL  Week 8 9/22   3.  Demonstrate proper form in at least 10 continuous lunges without increased pain Baseline:  Goal status: INITIAL  Week 12 11/3   4.  Demonstrate gentle, double and single foot plyometric motions with good proximal form Baseline:  Goal status: INITIAL Week 12 11/3   5.  Able to perform proper squat and sit<>stand to/from lower chairs that are present at work Baseline:  Goal status: INITIAL target date: POC date   6.  Able to return to [employer] (preferred workout) with understanding of proper progressions Baseline:  Goal status: INITIAL target date: POC date PLAN:  PT FREQUENCY: 1-2x/week  PT DURATION: POC date  PLANNED INTERVENTIONS: 97164- PT Re-evaluation, 97750- Physical Performance Testing, 97110-Therapeutic exercises, 97530- Therapeutic activity, 97112- Neuromuscular re-education, 97535- Self Care, [id number]- Manual therapy, [id number]- Gait  training, [phone]- Aquatic Therapy, [phone] (1-2 muscles), [id number] (3+ muscles)- Dry Needling, Patient/Family education, Balance training, Stair training, Taping, Joint mobilization, Spinal mobilization, Scar mobilization, and Cryotherapy.  PLAN FOR NEXT SESSION: per hip labral repair protocol   Asberry Rodes, PTA  05/17/24 5:07 PM

## 2024-05-18 ENCOUNTER — Encounter (INDEPENDENT_AMBULATORY_CARE_PROVIDER_SITE_OTHER): Payer: Self-pay | Admitting: Family Medicine

## 2024-05-18 ENCOUNTER — Ambulatory Visit (INDEPENDENT_AMBULATORY_CARE_PROVIDER_SITE_OTHER): Admitting: Family Medicine

## 2024-05-18 VITALS — BP 116/75 | HR 76 | Temp 97.4°F | Ht 61.0 in | Wt 170.0 lb

## 2024-05-18 DIAGNOSIS — E785 Hyperlipidemia, unspecified: Secondary | ICD-10-CM

## 2024-05-18 DIAGNOSIS — E039 Hypothyroidism, unspecified: Secondary | ICD-10-CM | POA: Diagnosis not present

## 2024-05-18 DIAGNOSIS — E669 Obesity, unspecified: Secondary | ICD-10-CM

## 2024-05-18 DIAGNOSIS — R7303 Prediabetes: Secondary | ICD-10-CM

## 2024-05-18 DIAGNOSIS — E7849 Other hyperlipidemia: Secondary | ICD-10-CM

## 2024-05-18 DIAGNOSIS — Z6832 Body mass index (BMI) 32.0-32.9, adult: Secondary | ICD-10-CM

## 2024-05-18 DIAGNOSIS — F5089 Other specified eating disorder: Secondary | ICD-10-CM

## 2024-05-18 DIAGNOSIS — E038 Other specified hypothyroidism: Secondary | ICD-10-CM

## 2024-05-18 DIAGNOSIS — Z6833 Body mass index (BMI) 33.0-33.9, adult: Secondary | ICD-10-CM

## 2024-05-18 DIAGNOSIS — E559 Vitamin D deficiency, unspecified: Secondary | ICD-10-CM | POA: Diagnosis not present

## 2024-05-18 DIAGNOSIS — F3289 Other specified depressive episodes: Secondary | ICD-10-CM

## 2024-05-18 MED ORDER — METFORMIN HCL 500 MG PO TABS: 500.0000 mg | ORAL_TABLET | Freq: Two times a day (BID) | ORAL | 0 refills | Status: DC

## 2024-05-18 MED ORDER — BUPROPION HCL ER (SR) 150 MG PO TB12: 150.0000 mg | ORAL_TABLET | Freq: Two times a day (BID) | ORAL | 0 refills | Status: AC

## 2024-05-18 MED ORDER — GEMFIBROZIL 600 MG PO TABS: 600.0000 mg | ORAL_TABLET | Freq: Two times a day (BID) | ORAL | 0 refills | Status: DC

## 2024-05-18 MED ORDER — VITAMIN D (ERGOCALCIFEROL) 1.25 MG (50000 UNIT) PO CAPS: ORAL_CAPSULE | ORAL | 0 refills | Status: DC

## 2024-05-18 MED ORDER — LEVOTHYROXINE SODIUM 75 MCG PO TABS: 75.0000 ug | ORAL_TABLET | Freq: Every day | ORAL | 0 refills | Status: DC

## 2024-05-18 MED ORDER — ATORVASTATIN CALCIUM 10 MG PO TABS: 10.0000 mg | ORAL_TABLET | Freq: Every day | ORAL | 0 refills | Status: DC

## 2024-05-18 NOTE — Progress Notes (Signed)
 Office: [phone]  /  Fax: [phone]  WEIGHT SUMMARY AND BIOMETRICS  Anthropometric Measurements Height: 5' 1 (1.549 m) Weight: 170 lb (77.1 kg) BMI (Calculated): 32.14 Weight at Last Visit: 165 lb Weight Lost Since Last Visit: 0 Weight Gained Since Last Visit: 5 lb Starting Weight: 178 lb Total Weight Loss (lbs): 8 lb (3.629 kg) Peak Weight: 178 lb   Body Composition  Body Fat %: 37.7 % Fat Mass (lbs): 64.4 lbs Muscle Mass (lbs): 101 lbs Total Body Water (lbs): 74.6 lbs Visceral Fat Rating : 9   Other Clinical Data Fasting: no Labs: no Today's Visit #: 38 Starting Date: 11/06/20 Comments: cat 3    Chief Complaint: OBESITY    History of Present Illness Betty Porter is a 53 year old female with obesity who presents for a follow-up on her obesity treatment plan.  She has been following the category three eating plan approximately 80% of the time but has not been engaging in regular exercise. Despite efforts to maintain hydration and increase intake of fruits, vegetables, and protein, she has gained five pounds in the last month. This weight gain coincided with her recent travel to [place], where she consumed high-calorie, high-fat foods during the independence celebration, including barbecues and sweets.  She experiences fluid retention, particularly in her legs and hands, which she attributes to travel and dietary changes.  Her current medications include ergocalciferol  50,000 IU weekly, metformin  500 mg twice daily, Wellbutrin  SR 150 mg twice daily, Lipitor, gemfibrozil , Synthroid , and vitamin D  supplements.      PHYSICAL EXAM:  Blood pressure 116/75, pulse 76, temperature (!) 97.4 F (36.3 C), height 5' 1 (1.549 m), weight 170 lb (77.1 kg), SpO2 97%. Body mass index is 32.12 kg/m.  DIAGNOSTIC DATA REVIEWED:  BMET    Component Value Date/Time   NA 135 03/03/2024 1400   NA 139 12/09/2023 0919   K 4.0 03/03/2024 1400   CL 104 03/03/2024  1400   CO2 24 03/03/2024 1400   GLUCOSE 95 03/03/2024 1400   BUN 20 03/03/2024 1400   BUN 13 12/09/2023 0919   CREATININE 1.13 (H) 03/03/2024 1400   CALCIUM  9.9 03/03/2024 1400   GFRNONAA 59 (L) 03/03/2024 1400   Lab Results  Component Value Date   HGBA1C 5.5 12/09/2023   HGBA1C 5.7 (H) 11/06/2020   Lab Results  Component Value Date   INSULIN  15.2 12/09/2023   INSULIN  11.9 11/06/2020   Lab Results  Component Value Date   TSH 1.960 12/09/2023   CBC    Component Value Date/Time   WBC 6.1 11/06/2020 1216   WBC 5.7 09/10/2020 0917   RBC 4.46 11/06/2020 1216   RBC 4.50 09/10/2020 0917   HGB 13.0 11/06/2020 1216   HCT 38.8 11/06/2020 1216   PLT 299 11/06/2020 1216   MCV 87 11/06/2020 1216   MCH 29.1 11/06/2020 1216   MCH 29.3 09/10/2020 0917   MCHC 33.5 11/06/2020 1216   MCHC 33.1 09/10/2020 0917   RDW 13.1 11/06/2020 1216   Iron Studies No results found for: IRON, TIBC, FERRITIN, IRONPCTSAT Lipid Panel     Component Value Date/Time   CHOL 210 (H) 12/09/2023 0919   TRIG 125 12/09/2023 0919   HDL 61 12/09/2023 0919   LDLCALC 127 (H) 12/09/2023 0919   Hepatic Function Panel     Component Value Date/Time   PROT 6.9 12/09/2023 0919   ALBUMIN 4.4 12/09/2023 0919   AST 24 12/09/2023 0919   ALT 26 12/09/2023  0919   ALKPHOS 81 12/09/2023 0919   BILITOT 0.3 12/09/2023 0919      Component Value Date/Time   TSH 1.960 12/09/2023 0919   Nutritional Lab Results  Component Value Date   VD25OH 47.1 12/09/2023   VD25OH 40.6 07/08/2023   VD25OH 37.4 11/19/2022     Assessment and Plan Assessment & Plan Obesity with emotional eating behaviors Obesity with recent weight gain of five pounds over the last month. Emotional eating behaviors, particularly during recent travel to [place], contributed to high-calorie intake. Fluid retention likely due to travel and dietary choices, including high-calorie and high-fat foods. Discussed the impact of simple carbohydrates  on fluid retention. - Encourage journaling of food intake, including portion sizes, calories, and protein content. - Recommend using My Fitness Pal or Lose It app for dietary tracking. - Advise maintaining caloric intake between 1100-1300 calories per day. - Ensure protein intake is a minimum of 75 grams per day, primarily from real food sources. - Discuss the use of compression socks during flights to manage fluid retention. - Provide protein pudding recipe for a low-calorie, high-protein dessert option.  Vitamin D  deficiency Vitamin D  deficiency currently managed with ergocalciferol  50,000 IU weekly. - Continue ergocalciferol  50,000 IU weekly.  Prediabetes Prediabetes managed with metformin  500 mg BID. - Continue metformin  500 mg BID. - Continue diet, exercise and weight loss as discussed today as an important part of the treatment plan  HLD  Stable on meds, working on diet - Send 90-day refill for Lipitor and gemfibrozil . - Continue diet, exercise and weight loss as discussed today as an important part of the treatment plan  Hypothyroid Stable on synthroid , no SE, RF requested - Send 90-day refill for Synthroid .      Betty Porter was informed of the importance of frequent follow up visits to maximize her success with intensive lifestyle modifications for her obesity and obesity related health conditions as recommended by USPSTF and CMS guidelines   [name], MD

## 2024-05-19 ENCOUNTER — Encounter (HOSPITAL_BASED_OUTPATIENT_CLINIC_OR_DEPARTMENT_OTHER): Admitting: Physical Therapy

## 2024-05-24 ENCOUNTER — Ambulatory Visit (HOSPITAL_BASED_OUTPATIENT_CLINIC_OR_DEPARTMENT_OTHER): Admitting: Physical Therapy

## 2024-05-24 DIAGNOSIS — M25551 Pain in right hip: Secondary | ICD-10-CM

## 2024-05-24 DIAGNOSIS — M6281 Muscle weakness (generalized): Secondary | ICD-10-CM

## 2024-05-24 DIAGNOSIS — R262 Difficulty in walking, not elsewhere classified: Secondary | ICD-10-CM

## 2024-05-24 NOTE — Therapy (Incomplete)
 OUTPATIENT PHYSICAL THERAPY TREATMENT   Patient Name: Betty Porter MRN: [id number] DOB:1971-05-11, 53 y.o., female Today's Date: 05/25/2024  END OF SESSION:  PT End of Session - 05/24/24 1726     Visit Number 10    Number of Visits 22    Date for Recertification  07/05/24    Authorization Type AETNA    PT Start Time 1637    PT Stop Time 1722    PT Time Calculation (min) 45 min    Activity Tolerance Patient tolerated treatment well    Behavior During Therapy [facility] for tasks assessed/performed                   Past Medical History:  Diagnosis Date   Acne    Adnexal mass    complex   Allergy 2010   Seasonal   Fibroid, uterine 2008   GERD (gastroesophageal reflux disease)    takes OTC meds   Hand swelling    Hyperlipidemia    Hypothyroidism    followed by pcp   Joint pain    Lactose intolerance    Lower extremity edema    PONV (postoperative nausea and vomiting)    Pre-diabetes    SUI (stress urinary incontinence, female)    Past Surgical History:  Procedure Laterality Date   CESAREAN SECTION  2009   LAPAROSCOPY  2008   for infertility   TONSILLECTOMY  1977   XI ROBOTIC ASSISTED OOPHORECTOMY N/A 09/12/2020   Procedure: XI ROBOTIC ASSISTED BILATERAL SALPINGECTOMY AND LEFT OOPHORECTOMY;  Surgeon: Viktoria Comer SAUNDERS, MD;  Location: [facility];  Service: Gynecology;  Laterality: N/A;   Patient Active Problem List   Diagnosis Date Noted   Tear of right acetabular labrum 03/07/2024   Chronic back pain 12/09/2023   SOB (shortness of breath) on exertion 12/09/2023   Chronic right hip pain 06/22/2023   Perimenopause 04/01/2023   BMI 31.0-31.9,adult 09/18/2022   Insulin  resistance 06/18/2022   Generalized obesity 04/22/2022   Hypothyroidism 03/25/2022   Other hyperlipidemia 03/25/2022   Depression 03/25/2022   Mixed hyperlipidemia 02/25/2022   Acquired hypothyroidism 02/25/2022   Pre-diabetes 02/25/2022   Vitamin D  deficiency  12/18/2020   At risk for deficient intake of food 12/18/2020   Elevated CA-125    Cyst of left ovary 08/31/2020   TMJ pain dysfunction syndrome 03/31/2017     REFERRING PROVIDER:  Genelle Standing, MD    REFERRING DIAG:  [phone] (ICD-10-CM) - Tear of right acetabular labrum, initial encounter    s/p Rt hip labral repair  Rationale for Evaluation and Treatment: Rehabilitation  THERAPY DIAG:  Pain in right hip  Muscle weakness (generalized)  Difficulty in walking, not elsewhere classified  ONSET DATE: DOS 03/07/24   SUBJECTIVE:  SUBJECTIVE STATEMENT: Pt is 11 weeks and 1 day post op.  Pt felt fine after prior treatment.  She started having a sharp pain on Sunday after sitting in the [facility] for 2 hours.  Pt states it's better though she still has pain.   Pt reports improved lifting R LE and moving R LE outward.  Pt has improved with ambulation.  Pt reports the first few steps are tough after sitting down for 5 mins.  After she walks a little, it feels better.  Pt is doing well at work.  She had to run a short distance at work to catch a child.  She had some pain during the running though had no pain afterwards.  Pt has difficulty with donning shoes.    PERTINENT HISTORY:  none  PAIN:  2/10 in R anterior hip 8/10 worst 0/10 best  PRECAUTIONS:  None  RED FLAGS: None   WEIGHT BEARING RESTRICTIONS:  No  FALLS:  Has patient fallen in last 6 months? 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  PATIENT SURVEYS:  LEFS   Extreme difficulty/unable (0), Quite a bit of difficulty (1), Moderate difficulty (2), Little difficulty (3), No difficulty (4) Survey date:    Any of your usual work, housework or school activities 1  2. Usual hobbies, recreational or sporting activities 0  3. Getting into/out of the bath 1  4. Walking between rooms 3  5. Putting on socks/shoes 1  6. Squatting  1  7. Lifting an object, like a bag of groceries from the floor 2  8. Performing light activities around your home 2  9. Performing heavy activities around your home 0  10. Getting into/out of a car 2  11. Walking 2 blocks 1  12. Walking 1 mile 0  13. Going up/down 10 stairs (1 flight) 0  14. Standing for 1 hour 0  15.  sitting for 1 hour 1  16. Running on even ground 0  17. Running on uneven ground 0  18. Making sharp turns while running fast 0  19. Hopping  0  20. Rolling over in bed 2  Score total:  17/80     COGNITIVE STATUS: Within functional limits for tasks assessed   SENSATION: WFL  GAIT: EVAL: uses AD for long distances but not otherwise; short step length, mild antalgia on Rt.    Body Part #1 Hip  PALPATION: EVAL: soft end feel at 90 deg hip flexion, no s/s of infection at incision site- most lateral incision leaking upon changing bandages  EVAL: good quad and glut set, able to demo controlled prone hamstring curl  TREATMENT DATE:   05/24/24 Gait:  Pt ambulates without limping without an AD.   Upright bike x 5 mins Seat 10 L4  Pt received R hip PROM in flexion, abd, ER, and IR in supine per pt and tissue tolerance.  R hip ROM: PROM  Flexion:  91 deg AROM Abd: 22  ER:  30 IR:  45    S/L clam approx 15 Supine bridge with RTB around thighs 2x10 Sidestepping x 2 laps at rail Bwd ambulation at rail Lateral band walks with RTB above knees x 2 laps at  rail  05/17/24 Upright bike x 5 mins Seat 10 L4 PROM R hip Mullligan mobilizations for R hip distraction and lateral glides STM to anterior hip/lateral hip/adductors LTR 5 x10 S/l clam 2x10  Passive HSS in supine 3x30sec LAQ (had pain in anterior hip, improved after prone quad stretching) Prone quad stretching with strap   05/10/24 Upright bike x 5 mins Seat 10 L4 Lumbar flexion stretch seated Seated fig 4 stretch 3x30sec each Seated HSS 30sec x3ea 1/2 kneel hip flexor stretch LTR 5 x10 S/l clam 2x10  Sit to stands 3x10 Gait in hall   04/21/24 Upright bike w/n protocol ranges x 5 mins Seat 10 L4 STM to lateral hip and ITB S/l clam 2x10  LAQ 3# 2x10 Seated  marching Standing hip abduction x10ea Standing hip extension x10ea Sit to stands 2x10 Gait in hall for observation Standing hip flexor stretch for L hip HEP update and review  04/14/24 Upright bike w/n protocol ranges x 5 mins Seat 10 R hip PROM Hooklying adductor sqz 5 2x10 STM to lateral hip and ITB S/l clam 2x10 (improved tolerance after STM) LAQ 3# 2x10 Side stepping along rail x3 laps (unresisted) Tennis ball against wall (instruction for HEP)    PATIENT EDUCATION:  Education details:  ROM findings, gait, Anatomy of condition, POC, HEP, exercise form/rationale Person educated: Patient Education method: Explanation, Demonstration, Tactile cues, Verbal cues, and Handouts Education comprehension: verbalized understanding, returned demonstration, verbal cues required, tactile cues required, and needs further education  HOME EXERCISE PROGRAM: Access Code: [email or web address] URL: [email or web address] Date: 03/10/2024 Prepared by: Harlene Cordon  Exercises - Lying Prone  - 3 x daily - 7 x weekly - 2-3 min hold - Prone Gluteal Sets  - 3 x daily - 7 x weekly - 1 sets - 10 reps - 3s hold - Prone Knee Flexion  - 3 x daily - 7 x weekly - 1 sets - 10 reps - Standing Weight Shift  - 5 x daily - 7 x  weekly - 1 sets - 10 reps - Supine Transversus Abdominis Bracing - Hands on Ground  - 3 x daily - 7 x weekly - 1 sets - 10 reps - 3 breaths hold - Supine Bilateral Hamstring Sets  - 3 x daily - 7 x weekly - 1 sets - 10 reps - 5s hold   ASSESSMENT:  CLINICAL IMPRESSION:  Patient is doing better now though she had increased pain including sharp pains when sitting in the [facility] for 2 hours.  Pt reports improved mobility in hip and improved ambulation.  Pt is ambulating well without a limp in the clinic.  She does report difficulty with the first few steps when getting out of a seated position though improves with taking more steps.  Pt states she is doing well at work.  She reports difficulty with donning shoes.  PT assessed ROM.  Overall, she is doing well with hip  ROM though is limited in flexion and has some pain with hip flexion.  It seems that pt was having anterior hip pain with hip flexion last treatment as well.  Pt performed exercises well with cuing and instruction in correct form.  Pt responded well to treatment reporting no increased pain after treatment.  She should benefit from cont skilled PT to address impairments and to assist in restoring desired level of function.        REHAB POTENTIAL: Good  CLINICAL DECISION MAKING: Stable/uncomplicated  EVALUATION COMPLEXITY: Low   GOALS: Goals reviewed with patient? Yes  SHORT TERM GOALS: Target date: 4 weeks 8/25   Pain free flexion to 90 Baseline: Goal status: INITIAL    2.  Demo controlled quad set with hold Baseline:  Goal status: MET 8/8   3.  30s sit to stand without pain or compensation Baseline:  Goal status: NOT ASSESSED   4.  SLS 30s without compensation Baseline:  Goal status: NOT ASSESSED   LONG TERM GOALS:   Able to demo step up on 6 step with level pelvis and proper form Baseline:  Goal status: NOT ASSESSED Week 8 9/22   2.  Will tolerate at least 3 min on elliptical, demonstrating good tolerance to  repetitive weight bearing motion Baseline:  Goal status: INITIAL  Week 8 9/22   3.  Demonstrate proper form in at least 10 continuous lunges without increased pain Baseline:  Goal status: INITIAL  Week 12 11/3   4.  Demonstrate gentle, double and single foot plyometric motions with good proximal form Baseline:  Goal status: INITIAL Week 12 11/3   5.  Able to perform proper squat and sit<>stand to/from lower chairs that are present at work Baseline:  Goal status: INITIAL target date: POC date   6.  Able to return to [employer] (preferred workout) with understanding of proper progressions Baseline:  Goal status: INITIAL target date: POC date PLAN:  PT FREQUENCY: 1-2x/week  PT DURATION: POC date  PLANNED INTERVENTIONS: 97164- PT Re-evaluation, 97750- Physical Performance Testing, 97110-Therapeutic exercises, 97530- Therapeutic activity, 97112- Neuromuscular re-education, 97535- Self Care, 02859- Manual therapy, [id number]- Gait training, [phone]- Aquatic Therapy, [phone] (1-2 muscles), [phone] (3+ muscles)- Dry Needling, Patient/Family education, Balance training, Stair training, Taping, Joint mobilization, Spinal mobilization, Scar mobilization, and Cryotherapy.  PLAN FOR NEXT SESSION: per hip labral repair protocol.  Give LEFS next visit.   Leigh Minerva III PT, DPT 05/25/24 11:29 PM

## 2024-05-25 ENCOUNTER — Encounter (HOSPITAL_BASED_OUTPATIENT_CLINIC_OR_DEPARTMENT_OTHER): Payer: Self-pay | Admitting: Physical Therapy

## 2024-05-26 ENCOUNTER — Encounter (HOSPITAL_BASED_OUTPATIENT_CLINIC_OR_DEPARTMENT_OTHER): Admitting: Physical Therapy

## 2024-05-28 ENCOUNTER — Ambulatory Visit
Admission: RE | Admit: 2024-05-28 | Discharge: 2024-05-28 | Disposition: A | Discharge status: Home / Self Care | Source: Ambulatory Visit | Attending: Orthopaedic Surgery | Admitting: Orthopaedic Surgery

## 2024-05-28 DIAGNOSIS — M25552 Pain in left hip: Secondary | ICD-10-CM

## 2024-05-31 ENCOUNTER — Encounter (HOSPITAL_BASED_OUTPATIENT_CLINIC_OR_DEPARTMENT_OTHER): Payer: Self-pay | Admitting: Physical Therapy

## 2024-05-31 ENCOUNTER — Ambulatory Visit (HOSPITAL_BASED_OUTPATIENT_CLINIC_OR_DEPARTMENT_OTHER): Admitting: Physical Therapy

## 2024-05-31 DIAGNOSIS — M25551 Pain in right hip: Secondary | ICD-10-CM

## 2024-05-31 DIAGNOSIS — R262 Difficulty in walking, not elsewhere classified: Secondary | ICD-10-CM

## 2024-05-31 DIAGNOSIS — M6281 Muscle weakness (generalized): Secondary | ICD-10-CM

## 2024-05-31 NOTE — Therapy (Signed)
 OUTPATIENT PHYSICAL THERAPY TREATMENT   Patient Name: Betty Porter MRN: [id number] DOB:10/15/1970, 53 y.o., female Today's Date: 05/31/2024  END OF SESSION:  PT End of Session - 05/31/24 1622     Visit Number 11    Number of Visits 22    Date for Recertification  07/05/24    Authorization Type AETNA    PT Start Time 1620    PT Stop Time 1658    PT Time Calculation (min) 38 min    Behavior During Therapy [facility] for tasks assessed/performed           Past Medical History:  Diagnosis Date   Acne    Adnexal mass    complex   Allergy 2010   Seasonal   Fibroid, uterine 2008   GERD (gastroesophageal reflux disease)    takes OTC meds   Hand swelling    Hyperlipidemia    Hypothyroidism    followed by pcp   Joint pain    Lactose intolerance    Lower extremity edema    PONV (postoperative nausea and vomiting)    Pre-diabetes    SUI (stress urinary incontinence, female)    Past Surgical History:  Procedure Laterality Date   CESAREAN SECTION  2009   LAPAROSCOPY  2008   for infertility   TONSILLECTOMY  1977   XI ROBOTIC ASSISTED OOPHORECTOMY N/A 09/12/2020   Procedure: XI ROBOTIC ASSISTED BILATERAL SALPINGECTOMY AND LEFT OOPHORECTOMY;  Surgeon: Viktoria Comer SAUNDERS, MD;  Location: [facility];  Service: Gynecology;  Laterality: N/A;   Patient Active Problem List   Diagnosis Date Noted   Tear of right acetabular labrum 03/07/2024   Chronic back pain 12/09/2023   SOB (shortness of breath) on exertion 12/09/2023   Chronic right hip pain 06/22/2023   Perimenopause 04/01/2023   BMI 31.0-31.9,adult 09/18/2022   Insulin  resistance 06/18/2022   Generalized obesity 04/22/2022   Hypothyroidism 03/25/2022   Other hyperlipidemia 03/25/2022   Depression 03/25/2022   Mixed hyperlipidemia 02/25/2022   Acquired hypothyroidism 02/25/2022   Pre-diabetes 02/25/2022   Vitamin D  deficiency 12/18/2020   At risk for deficient intake of food 12/18/2020   Elevated  CA-125    Cyst of left ovary 08/31/2020   TMJ pain dysfunction syndrome 03/31/2017     REFERRING PROVIDER:  Genelle Standing, MD    REFERRING DIAG:  [phone] (ICD-10-CM) - Tear of right acetabular labrum, initial encounter    s/p Rt hip labral repair  Rationale for Evaluation and Treatment: Rehabilitation  THERAPY DIAG:  Pain in right hip  Muscle weakness (generalized)  Difficulty in walking, not elsewhere classified  ONSET DATE: DOS 03/07/24   SUBJECTIVE:  SUBJECTIVE STATEMENT: Pt reports she continues to have difficulty donning shoe, crossing LE, and has pain with pivoting.  Otherwise, she feels she can do pretty much everything else.  She reports she is able to go up stairs in reciprocal pattern at work, without pain or difficulty.    PERTINENT HISTORY:  none  PAIN:  2/10 in R anterior hip Worsened by: see above Relieved by: heat   PRECAUTIONS:  None  RED FLAGS: None   WEIGHT BEARING RESTRICTIONS:  No  FALLS:  Has patient fallen in last 6 months? 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  PATIENT SURVEYS:  LEFS  Extreme difficulty/unable (0), Quite a bit of difficulty (1), Moderate difficulty (2), Little difficulty (3), No difficulty (4) Survey date:  eval 05/31/24  Any of your usual work, housework or school activities 1 3  2. Usual hobbies, recreational or sporting activities 0 3  3. Getting into/out of the bath 1 3  4. Walking between rooms 3 4  5. Putting on socks/shoes 1 2  6. Squatting  1   7.  Lifting an object, like a bag of groceries from the floor 2 2  8. Performing light activities around your home 2 2  9. Performing heavy activities around your home 0 2  10. Getting into/out of a car 2 3  11. Walking 2 blocks 1 3  12. Walking 1 mile 0 2  13. Going up/down 10 stairs (1 flight) 0 4  14. Standing for 1 hour 0 3  15.  sitting for 1 hour 1 2  16. Running on even ground 0 3  17. Running on uneven ground 0 2  18. Making sharp turns while running fast 0 2  19. Hopping  0 2  20. Rolling over in bed 2 3  Score total:  17/80 55/80     COGNITIVE STATUS: Within functional limits for tasks assessed   SENSATION: WFL  GAIT: EVAL: uses AD for long distances but not otherwise; short step length, mild antalgia on Rt.    Body Part #1 Hip  PALPATION: EVAL: soft end feel at 90 deg hip flexion, no s/s of infection at incision site- most lateral incision leaking upon changing bandages  EVAL: good quad and glut set, able to demo controlled prone hamstring curl                                                                                                                            TREATMENT DATE:  05/31/24 -LEFS -Self care: pt instructed in self massage with roller stick to Rt ant thigh and scar mobilization/ desensitization - pt returned demo with cues -Treadmill: up to 2. x 5  min for dynamic warm up - Standing quad stretch x 20 sec R x 2 reps  -30 sec SLS on RLE  - 30 STS = 13 - standing side to side gentle lunges with UE On hand rail  -Standing to/from Rt tall kneeling on 3 pad x 3 reps (difficult) - hooklying fig 4 stretch with strap assist x 3 reps  - sensitive skin rock tape applied to Rt incision to assist with desensitization    05/24/24 Gait:  Pt ambulates without limping without an AD.   Upright bike x 5 mins Seat 10 L4  Pt received R hip PROM in flexion, abd, ER, and IR in supine per pt and tissue tolerance.  R hip ROM: PROM  Flexion:  91  deg AROM Abd: 22  ER:  30 IR:  45    S/L clam approx 15 Supine bridge with RTB around thighs 2x10 Sidestepping x 2 laps at rail Bwd ambulation at rail Lateral band walks with RTB above knees x 2 laps at rail  05/17/24 Upright bike x 5 mins Seat 10 L4 PROM R hip Mullligan mobilizations for R hip distraction and lateral glides STM to anterior hip/lateral hip/adductors LTR 5 x10 S/l clam 2x10  Passive HSS in supine 3x30sec LAQ (had pain in anterior hip, improved after prone quad stretching) Prone quad stretching with strap   05/10/24 Upright bike x 5 mins Seat 10 L4 Lumbar flexion stretch seated Seated fig 4 stretch 3x30sec each Seated HSS 30sec x3ea 1/2 kneel hip flexor stretch LTR 5 x10 S/l clam 2x10  Sit to stands 3x10 Gait in hall   04/21/24 Upright bike w/n protocol ranges x 5 mins Seat 10 L4 STM to lateral hip and ITB S/l clam 2x10  LAQ 3# 2x10 Seated  marching Standing hip abduction x10ea Standing hip extension x10ea Sit to stands 2x10 Gait in hall for observation Standing hip flexor stretch for L hip HEP update and review  04/14/24 Upright bike w/n protocol ranges x 5 mins Seat 10 R hip PROM Hooklying adductor sqz 5 2x10 STM to lateral hip and ITB S/l clam 2x10 (improved tolerance after STM) LAQ 3# 2x10 Side stepping along rail x3 laps (unresisted) Tennis ball against wall (instruction for HEP)    PATIENT EDUCATION:  Education details:   HEP, exercise form/rationale Person educated: Patient Education method: Explanation, Demonstration, Tactile cues, Verbal cues Education comprehension: verbalized understanding, returned demonstration, verbal cues required, tactile cues required, and needs further education  HOME EXERCISE PROGRAM: Access Code: [email or web address] URL: [email or web address] Date: 03/10/2024 Prepared by: Harlene Cordon  Exercises - Lying Prone  - 3 x daily - 7 x weekly - 2-3 min hold - Prone Gluteal Sets  - 3 x daily - 7  x weekly - 1 sets - 10 reps - 3s hold - Prone Knee Flexion  - 3 x daily - 7 x weekly - 1 sets - 10 reps - Standing Weight Shift  - 5 x daily - 7 x weekly - 1 sets - 10 reps - Supine Transversus Abdominis Bracing - Hands on Ground  - 3 x daily - 7 x weekly - 1 sets - 10 reps - 3 breaths hold - Supine Bilateral Hamstring Sets  - 3 x daily - 7 x weekly - 1 sets - 10 reps - 5s hold   ASSESSMENT:  CLINICAL IMPRESSION: Pt can complete 30 sec SLS and 30 sec STS without compensation or pain.  Pt tolerated session well, reporting some increased soreness in Rt  groin with flexion >95 and with attempts at active ER for donning socks.  She has met majority of STG and is progressing well towards LTGs.  LEFS improved to 55/80.  She should benefit from cont skilled PT to address impairments and to assist in restoring desired level of function.        REHAB POTENTIAL: Good  CLINICAL DECISION MAKING: Stable/uncomplicated  EVALUATION COMPLEXITY: Low   GOALS: Goals reviewed with patient? Yes  SHORT TERM GOALS: Target date: 4 weeks 8/25   Pain free flexion to 90 Baseline: Goal status: MET  05/31/24   2.  Demo controlled quad set with hold Baseline:  Goal status: MET 8/8   3.  30s sit to stand without pain or compensation Baseline: see treatment 10/21 Goal status:MET 05/31/24   4.  SLS 30s without compensation Baseline:  Goal status: MET 05/31/24  LONG TERM GOALS:   Able to demo step up on 6 step with level pelvis and proper form Baseline:  Goal status: NOT ASSESSED Week 8 9/22   2.  Will tolerate at least 3 min on elliptical, demonstrating good tolerance to repetitive weight bearing motion Baseline:  Goal status: INITIAL  Week 8 9/22   3.  Demonstrate proper form in at least 10 continuous lunges without increased pain Baseline:  Goal status: INITIAL  Week 12 11/3   4.  Demonstrate gentle, double and single foot plyometric motions with good proximal form Baseline:  Goal status:  INITIAL Week 12 11/3   5.  Able to perform proper squat and sit<>stand to/from lower chairs that are present at work Baseline:  Goal status: IN PROGRESS  target date: POC date   6.  Able to return to [employer] (preferred workout) with understanding of proper progressions Baseline:  Goal status: INITIAL target date: POC date PLAN:  PT FREQUENCY: 1-2x/week  PT DURATION: POC date  PLANNED INTERVENTIONS: 97164- PT Re-evaluation, 97750- Physical Performance Testing, 97110-Therapeutic exercises, 97530- Therapeutic activity, 97112- Neuromuscular re-education, 97535- Self Care, 02859- Manual therapy, [id number]- Gait training, [phone]- Aquatic Therapy, [phone] (1-2 muscles), 20561 (3+ muscles)- Dry Needling, Patient/Family education, Balance training, Stair training, Taping, Joint mobilization, Spinal mobilization, Scar mobilization, and Cryotherapy.  PLAN FOR NEXT SESSION: per hip labral repair protocol.     Delon Aquas, PTA 05/31/24 5:48 PM [facility] [address] [place], [place], [address] Phone: [phone]   Fax:  [phone]

## 2024-06-01 ENCOUNTER — Ambulatory Visit (HOSPITAL_BASED_OUTPATIENT_CLINIC_OR_DEPARTMENT_OTHER): Admitting: Orthopaedic Surgery

## 2024-06-01 ENCOUNTER — Ambulatory Visit (HOSPITAL_BASED_OUTPATIENT_CLINIC_OR_DEPARTMENT_OTHER): Payer: Self-pay | Admitting: Orthopaedic Surgery

## 2024-06-01 DIAGNOSIS — S73192A Other sprain of left hip, initial encounter: Secondary | ICD-10-CM

## 2024-06-01 NOTE — Progress Notes (Signed)
 Post Operative Evaluation    Procedure/Date of Surgery: right hip labral repair 7/28 Interval History:    Presents today 10-week status post above procedure.  Overall she is doing well.  At this time she is having more so left hip pain and her left hip is now the worst hip.  She is here today for MRI discussion  PMH/PSH/Family History/Social History/Meds/Allergies:    Past Medical History:  Diagnosis Date   Acne    Adnexal mass    complex   Allergy 2010   Seasonal   Fibroid, uterine 2008   GERD (gastroesophageal reflux disease)    takes OTC meds   Hand swelling    Hyperlipidemia    Hypothyroidism    followed by pcp   Joint pain    Lactose intolerance    Lower extremity edema    PONV (postoperative nausea and vomiting)    Pre-diabetes    SUI (stress urinary incontinence, female)    Past Surgical History:  Procedure Laterality Date   CESAREAN SECTION  2009   LAPAROSCOPY  2008   for infertility   TONSILLECTOMY  1977   XI ROBOTIC ASSISTED OOPHORECTOMY N/A 09/12/2020   Procedure: XI ROBOTIC ASSISTED BILATERAL SALPINGECTOMY AND LEFT OOPHORECTOMY;  Surgeon: Viktoria Comer SAUNDERS, MD;  Location: [facility];  Service: Gynecology;  Laterality: N/A;   Social History   Socioeconomic History   Marital status: Widowed    Spouse name: Not on file   Number of children: Not on file   Years of education: Not on file   Highest education level: Associate degree: occupational, Scientist, product/process development, or vocational program  Occupational History   Occupation: Chiropractor  Tobacco Use   Smoking status: Former    Current packs/day: 0.00    Average packs/day: 0.8 packs/day for 10.0 years (7.5 ttl pk-yrs)    Types: Cigarettes    Start date: 08/11/1986    Quit date: 08/11/1996    Years since quitting: 27.8   Smokeless tobacco: Never  Vaping Use   Vaping status: Never Used  Substance and Sexual Activity   Alcohol use: Not Currently   Drug  use: Never   Sexual activity: Not Currently    Birth control/protection: None, Surgical    Comment: bil salping, left ooph  Other Topics Concern   Not on file  Social History Narrative   Not on file   Social Drivers of Health   Financial Resource Strain: Low Risk  (06/18/2023)   Overall Financial Resource Strain (CARDIA)    Difficulty of Paying Living Expenses: Not hard at all  Food Insecurity: No Food Insecurity (06/18/2023)   Hunger Vital Sign    Worried About Running Out of Food in the Last Year: Never true    Ran Out of Food in the Last Year: Never true  Transportation Needs: No Transportation Needs (06/18/2023)   PRAPARE - Administrator, Civil Service (Medical): No    Lack of Transportation (Non-Medical): No  Physical Activity: Sufficiently Active (06/18/2023)   Exercise Vital Sign    Days of Exercise per Week: 3 days    Minutes of Exercise per Session: 60 min  Stress: No Stress Concern Present (06/18/2023)   [employer] of Occupational Health - Occupational Stress Questionnaire    Feeling of Stress : Only a little  Social Connections: Socially Isolated (06/18/2023)   Social Connection and Isolation Panel    Frequency of Communication with Friends and Family: Three times a week    Frequency of Social Gatherings with Friends and Family: Never    Attends Religious Services: Never    Database administrator or Organizations: No    Attends Engineer, structural: Not on file    Marital Status: Widowed   Family History  Problem Relation Age of Onset   Breast cancer Maternal Aunt    Cancer Maternal Aunt    Hypertension Mother    Osteoporosis Mother    Arthritis Mother    Diabetes Father    Hyperlipidemia Father    Obesity Father    Thyroid  cancer Maternal Aunt    Leukemia Maternal Uncle    Diabetes Paternal Grandfather    Cancer Maternal Uncle    Colon cancer Neg Hx    Ovarian cancer Neg Hx    Endometrial cancer Neg Hx    Pancreatic cancer Neg  Hx    Prostate cancer Neg Hx    No Known Allergies Current Outpatient Medications  Medication Sig Dispense Refill   atorvastatin  (LIPITOR) 10 MG tablet Take 1 tablet (10 mg total) by mouth at bedtime. 90 tablet 0   buPROPion  (WELLBUTRIN  SR) 150 MG 12 hr tablet Take 1 tablet (150 mg total) by mouth 2 (two) times daily. 180 tablet 0   gemfibrozil  (LOPID ) 600 MG tablet Take 1 tablet (600 mg total) by mouth in the morning and at bedtime. 90 tablet 0   levothyroxine  (SYNTHROID ) 75 MCG tablet Take 1 tablet (75 mcg total) by mouth daily before breakfast. 90 tablet 0   metFORMIN  (GLUCOPHAGE ) 500 MG tablet Take 1 tablet (500 mg total) by mouth 2 (two) times daily with a meal. 180 tablet 0   Vitamin D , Ergocalciferol , (DRISDOL ) 1.25 MG (50000 UNIT) CAPS capsule TAKE ONE CAPSULE BY MOUTH ONCE WEEKLY (EVERY 7 DAYS) 13 capsule 0   No current facility-administered medications for this visit.   No results found.  Review of Systems:   A ROS was performed including pertinent positives and negatives as documented in the HPI.   Musculoskeletal Exam:    There were no vitals taken for this visit.  Right hip incisions are well-appearing without erythema or drainage.  Walks without antalgic gait with a mildly shortened stance phase.  Distal neurosensory exam is intact  Left hip with positive FADIR and 30 degrees internal rotation 90 degrees flexion external rotation 45 degrees improves this.  Mild weakness with abduction strength testing.  Remainder of distal neurosensory exams intact  Imaging:    3 views left hip: Normal  MRI left hip: Anterior superior labral tear  I personally reviewed and interpreted the radiographs.   Assessment:   Status post right hip labral repair doing well.  At this time she is still having left hip based pain and difficulty ambulating despite physical therapy.  She is still having pain deep in the groin.  Did discuss that her MRI does show evidence of a left hip labral  tear.  At this time she has trialed strengthening of the left hip without relief.  Given this we will plan for left hip labral repair given how well she did with the right.  I discussed the risk limitations.  She will begin physical therapy as well as and continue to work on her left side  Plan :    - Plan for left hip arthroscopy  with labral pair   After a lengthy discussion of treatment options, including risks, benefits, alternatives, complications of surgical and nonsurgical conservative options, the patient elected surgical repair.   The patient  is aware of the material risks  and complications including, but not limited to injury to adjacent structures, neurovascular injury, infection, numbness, bleeding, implant failure, thermal burns, stiffness, persistent pain, failure to heal, disease transmission from allograft, need for further surgery, dislocation, anesthetic risks, blood clots, risks of death,and others. The probabilities of surgical success and failure discussed with patient given their particular co-morbidities.The time and nature of expected rehabilitation and recovery was discussed.The patient's questions were all answered preoperatively.  No barriers to understanding were noted. I explained the natural history of the disease process and Rx rationale.  I explained to the patient what I considered to be reasonable expectations given their personal situation.  The final treatment plan was arrived at through a shared patient decision making process model.       I personally saw and evaluated the patient, and participated in the management and treatment plan.  Elspeth Parker, MD Attending Physician, Orthopedic Surgery  This document was dictated using Dragon voice recognition software. A reasonable attempt at proof reading has been made to minimize errors.

## 2024-06-02 ENCOUNTER — Encounter (HOSPITAL_BASED_OUTPATIENT_CLINIC_OR_DEPARTMENT_OTHER): Admitting: Physical Therapy

## 2024-06-13 ENCOUNTER — Encounter: Payer: Self-pay | Admitting: Radiology

## 2024-06-14 ENCOUNTER — Ambulatory Visit (INDEPENDENT_AMBULATORY_CARE_PROVIDER_SITE_OTHER): Payer: Self-pay | Admitting: Family Medicine

## 2024-06-14 ENCOUNTER — Encounter (INDEPENDENT_AMBULATORY_CARE_PROVIDER_SITE_OTHER): Payer: Self-pay | Admitting: Family Medicine

## 2024-06-14 VITALS — BP 106/71 | HR 86 | Temp 97.9°F | Ht 61.0 in | Wt 169.0 lb

## 2024-06-14 DIAGNOSIS — N951 Menopausal and female climacteric states: Secondary | ICD-10-CM

## 2024-06-14 DIAGNOSIS — E669 Obesity, unspecified: Secondary | ICD-10-CM | POA: Diagnosis not present

## 2024-06-14 DIAGNOSIS — Z6831 Body mass index (BMI) 31.0-31.9, adult: Secondary | ICD-10-CM

## 2024-06-14 NOTE — Progress Notes (Signed)
 Office: [phone]  /  Fax: [phone]  WEIGHT SUMMARY AND BIOMETRICS  Anthropometric Measurements Height: 5' 1 (1.549 m) Weight: 169 lb (76.7 kg) BMI (Calculated): 31.95 Weight at Last Visit: 170 lb Weight Lost Since Last Visit: 1 lb Weight Gained Since Last Visit: 0 Starting Weight: 178 lb Total Weight Loss (lbs): 9 lb (4.082 kg) Peak Weight: 178 lb   Body Composition  Body Fat %: 37.1 % Fat Mass (lbs): 63 lbs Muscle Mass (lbs): 101.2 lbs Total Body Water (lbs): 72.4 lbs Visceral Fat Rating : 9   Other Clinical Data Fasting: no Labs: no Today's Visit #: 39 Starting Date: 11/06/20 Comments: cat 3    Chief Complaint: OBESITY    History of Present Illness Betty Porter is a 53 year old female who presents for obesity treatment follow-up.  She has been following the prescribed category three eating plan approximately 40-50% of the time, resulting in a weight loss of only one pound over the past month.  She is not engaging in any exercise due to exhaustion and pain, which she attributes to sciatica and hip issues. Her energy levels have significantly decreased compared to two years ago when she was more physically active.  She attributes some of her symptoms to menopause, experiencing increased fatigue, aches, and irritability. She sleeps 6-7 hours per night, but it does not feel sufficient.  She is currently taking Synthroid , which she takes separately from other medications.  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      PHYSICAL EXAM:  Blood pressure 106/71, pulse 86, temperature 97.9 F (36.6 C), height 5' 1 (1.549 m), weight 169 lb (76.7 kg), SpO2 98%. Body mass index is 31.93 kg/m.  DIAGNOSTIC DATA REVIEWED:  BMET    Component Value Date/Time   NA 135 03/03/2024 1400   NA 139 12/09/2023 0919   K 4.0 03/03/2024 1400   CL 104 03/03/2024 1400   CO2 24 03/03/2024 1400   GLUCOSE 95 03/03/2024 1400   BUN 20  03/03/2024 1400   BUN 13 12/09/2023 0919   CREATININE 1.13 (H) 03/03/2024 1400   CALCIUM  9.9 03/03/2024 1400   GFRNONAA 59 (L) 03/03/2024 1400   Lab Results  Component Value Date   HGBA1C 5.5 12/09/2023   HGBA1C 5.7 (H) 11/06/2020   Lab Results  Component Value Date   INSULIN  15.2 12/09/2023   INSULIN  11.9 11/06/2020   Lab Results  Component Value Date   TSH 1.960 12/09/2023   CBC    Component Value Date/Time   WBC 6.1 11/06/2020 1216   WBC 5.7 09/10/2020 0917   RBC 4.46 11/06/2020 1216   RBC 4.50 09/10/2020 0917   HGB 13.0 11/06/2020 1216   HCT 38.8 11/06/2020 1216   PLT 299 11/06/2020 1216   MCV 87 11/06/2020 1216   MCH 29.1 11/06/2020 1216   MCH 29.3 09/10/2020 0917   MCHC 33.5 11/06/2020 1216   MCHC 33.1 09/10/2020 0917   RDW 13.1 11/06/2020 1216   Iron Studies No results found for: IRON, TIBC, FERRITIN, IRONPCTSAT Lipid Panel     Component Value Date/Time   CHOL 210 (H) 12/09/2023 0919   TRIG 125 12/09/2023 0919   HDL 61 12/09/2023 0919   LDLCALC 127 (H) 12/09/2023 0919   Hepatic Function Panel     Component Value Date/Time   PROT 6.9 12/09/2023 0919   ALBUMIN 4.4 12/09/2023 0919   AST 24 12/09/2023 0919   ALT 26 12/09/2023 0919  ALKPHOS 81 12/09/2023 0919   BILITOT 0.3 12/09/2023 0919      Component Value Date/Time   TSH 1.960 12/09/2023 0919   Nutritional Lab Results  Component Value Date   VD25OH 47.1 12/09/2023   VD25OH 40.6 07/08/2023   VD25OH 37.4 11/19/2022     Assessment and Plan Assessment & Plan Obesity Management is ongoing with a focus on dietary strategies. She adheres to the category three eating plan 40-50% of the time and has lost one pound in the last month. She is not currently exercising due to fatigue and pain, likely related to menopausal symptoms and sciatica. - Continue category three eating plan. - Implement 'don't let it touch' strategy during meals to manage portion sizes. - Encouraged reassessment of  hunger levels after meals to avoid overeating. - Discussed potential benefits of hormone replacement therapy with primary care provider.  Menopausal symptoms Include fatigue, irritability, and joint pain, impacting daily activities and exercise tolerance. She is not eligible for hormone replacement therapy but is considering other options. Discussed potential benefits of hormone replacement therapy, including improved energy levels and reduction in aches and pains. Emphasized the importance of consulting with a primary care provider for personalized advice. Discussed the lack of regulation and potential inaccuracies in herbal supplements. - Discuss menopausal symptoms and potential treatment options with primary care provider. - Consider herbal supplements with caution regarding their regulation and efficacy.     Betty Porter was counseled on the importance of maintaining healthy lifestyle habits, including balanced nutrition, regular physical activity, and behavioral modifications, while taking antiobesity medication.  Patient verbalized understanding that medication is an adjunct to, not a replacement for, lifestyle changes and that the long-term success and weight maintenance depend on continued adherence to these strategies.   Betty Porter was informed of the importance of frequent follow up visits to maximize her success with intensive lifestyle modifications for her obesity and obesity related health conditions as recommended by USPSTF and CMS guidelines   [name], MD

## 2024-06-21 ENCOUNTER — Ambulatory Visit (HOSPITAL_BASED_OUTPATIENT_CLINIC_OR_DEPARTMENT_OTHER): Attending: Orthopaedic Surgery | Admitting: Physical Therapy

## 2024-06-21 DIAGNOSIS — R262 Difficulty in walking, not elsewhere classified: Secondary | ICD-10-CM | POA: Insufficient documentation

## 2024-06-21 DIAGNOSIS — M25551 Pain in right hip: Secondary | ICD-10-CM | POA: Insufficient documentation

## 2024-06-21 DIAGNOSIS — M6281 Muscle weakness (generalized): Secondary | ICD-10-CM | POA: Insufficient documentation

## 2024-06-21 DIAGNOSIS — S73192A Other sprain of left hip, initial encounter: Secondary | ICD-10-CM | POA: Insufficient documentation

## 2024-06-21 DIAGNOSIS — M25552 Pain in left hip: Secondary | ICD-10-CM | POA: Insufficient documentation

## 2024-06-21 NOTE — Therapy (Signed)
 OUTPATIENT PHYSICAL THERAPY TREATMENT RE-CERT   Patient Name: Betty Porter MRN: [id number] DOB:Apr 29, 1971, 53 y.o., female Today's Date: 06/22/2024  END OF SESSION:  PT End of Session - 06/21/24 1459     Visit Number 12    Number of Visits 22    Date for Recertification  07/26/24    Authorization Type AETNA    PT Start Time 1453    PT Stop Time 1540    PT Time Calculation (min) 47 min    Activity Tolerance Patient tolerated treatment well    Behavior During Therapy [facility] for tasks assessed/performed            Past Medical History:  Diagnosis Date   Acne    Adnexal mass    complex   Allergy 2010   Seasonal   Fibroid, uterine 2008   GERD (gastroesophageal reflux disease)    takes OTC meds   Hand swelling    Hyperlipidemia    Hypothyroidism    followed by pcp   Joint pain    Lactose intolerance    Lower extremity edema    PONV (postoperative nausea and vomiting)    Pre-diabetes    SUI (stress urinary incontinence, female)    Past Surgical History:  Procedure Laterality Date   CESAREAN SECTION  2009   LAPAROSCOPY  2008   for infertility   TONSILLECTOMY  1977   XI ROBOTIC ASSISTED OOPHORECTOMY N/A 09/12/2020   Procedure: XI ROBOTIC ASSISTED BILATERAL SALPINGECTOMY AND LEFT OOPHORECTOMY;  Surgeon: Viktoria Comer SAUNDERS, MD;  Location: [facility];  Service: Gynecology;  Laterality: N/A;   Patient Active Problem List   Diagnosis Date Noted   Tear of right acetabular labrum 03/07/2024   Chronic back pain 12/09/2023   SOB (shortness of breath) on exertion 12/09/2023   Chronic right hip pain 06/22/2023   Perimenopause 04/01/2023   BMI 31.0-31.9,adult 09/18/2022   Insulin  resistance 06/18/2022   Generalized obesity 04/22/2022   Hypothyroidism 03/25/2022   Other hyperlipidemia 03/25/2022   Depression 03/25/2022   Mixed hyperlipidemia 02/25/2022   Acquired hypothyroidism 02/25/2022   Pre-diabetes 02/25/2022   Vitamin D  deficiency  12/18/2020   At risk for deficient intake of food 12/18/2020   Elevated CA-125    Cyst of left ovary 08/31/2020   TMJ pain dysfunction syndrome 03/31/2017     REFERRING PROVIDER:  Genelle Standing, MD    REFERRING DIAG:  [phone] (ICD-10-CM) - Tear of right acetabular labrum, initial encounter    s/p Rt hip labral repair  D26.807J (ICD-10-CM) - Tear of left acetabular labrum, initial encounter     Rationale for Evaluation and Treatment: Rehabilitation  THERAPY DIAG:  Pain in right hip  Pain in left hip  Muscle weakness (generalized)  Difficulty in walking, not elsewhere classified  ONSET DATE: DOS 03/07/24   SUBJECTIVE:  SUBJECTIVE STATEMENT: Pt is 15 weeks and 1 day s/p R hip labral repair.  Pt states her R hip is doing great, but she is having some pain in left hip.  Pt states she has pain with increased activity.  Pt states her job has not been as busy/strenuous and her L hip has not hurt as much recently.  Pt reports increased hip pain when she is busier at work.    Pt had a MRI on L hip on 10/18 and saw MD on 06/01/24.  MD note indicated MRI showing evidence of a left hip labral tear and Plan for left hip arthroscopy with labral pair.  Pt states MD wants pt to try PT for L hip to see if it helps.  MD placed an order for Tear of left acetabular labrum and indicated Left hip strengthening.    Pt states she has more mobility without pain.  Pt reports improved performance of donning and tying R shoe.  She has a little difficulty though no pain.  Pt still has pain with pivoting, maybe a 3/10.  She reports she is able to ascend and descend stairs without pain or difficulty.    PERTINENT HISTORY:  none  PAIN:  0/10 at rest and 2-3/10 with certain movements in R anterior hip 0/10 pain in  L hip over the last few days.  L hip pain located in groin, anterior hip, and lateral hip.  6-7/10 pain in bilat hips after a busy day at work.  Pt states the R hip pain she has after a busy day is due to bursitis.     PRECAUTIONS:  None  RED FLAGS: None   WEIGHT BEARING RESTRICTIONS:  No  FALLS:  Has patient fallen in last 6 months? 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  L hip MRI 10/18: IMPRESSION: Mild to moderate insertional tendinopathy of the gluteus minimus bilaterally   Mild tendinopathy and mild chronic appearing partial tears to the proximal hamstrings greater on the left. Correlate for point tenderness.  PATIENT SURVEYS:  LEFS  Extreme difficulty/unable (0), Quite a bit of difficulty (1), Moderate difficulty (2), Little difficulty (3), No difficulty (4) Survey date:  eval 05/31/24  06/21/24  Any of your usual work, housework or school activities 1 3 4   2. Usual hobbies, recreational or sporting activities 0 3 3  3. Getting into/out of the bath 1 3 3   4. Walking between rooms 3 4 4   5. Putting on socks/shoes 1 2 3   6. Squatting  1  3  7. Lifting an object, like a bag of groceries from the floor 2 2 3   8. Performing light activities around your home 2 2 4   9. Performing heavy activities around your home 0 2 3  10. Getting into/out of a car 2 3 4   11. Walking 2 blocks 1 3 4   12. Walking 1 mile 0 2 3  13. Going up/down 10 stairs (1 flight) 0 4 4  14. Standing for 1 hour 0 3 3  15.  sitting for 1 hour 1 2 2   16. Running on even ground 0 3 0  17. Running on uneven ground 0 2 0  18. Making sharp turns  while running fast 0 2 0  19. Hopping  0 2 4  20. Rolling over in bed 2 3 3   Score total:   17/80 55/80 57/80      COGNITIVE STATUS: Within functional limits for tasks assessed   SENSATION: WFL  GAIT: EVAL: uses AD for long distances but not otherwise; short step length, mild antalgia on Rt.    Body Part #1 Hip  PALPATION: EVAL: soft end feel at 90 deg hip flexion, no s/s of infection at incision site- most lateral incision leaking upon changing bandages  EVAL: good quad and glut set, able to demo controlled prone hamstring curl                                                                                                                            TREATMENT DATE:  06/21/24 Reviewed current function and pain levels.  Strength:   5/5 bilat knee extension Hip abduction:  R: 4/5 in, L: 5/5 with pain bilat Hip flexion:  L:  5/5 with pain ER:  R:  tolerated resistance though had pain, L:  tolerated slight resistance with pain  R/L hip AROM: Hip flexion:  L: 116 deg Hip abduction:  R/L: 30/25 deg ER:  R:  39 deg, L:  41 deg  Lateral band walks with RTB above knees x 3 laps at rail Step ups 8 inch step 2x10   05/31/24 -LEFS -Self care: pt instructed in self massage with roller stick to Rt ant thigh and scar mobilization/ desensitization - pt returned demo with cues -Treadmill: up to 2. x 5 min for dynamic warm up - Standing quad stretch x 20 sec R x 2 reps  -30 sec SLS on RLE  - 30 STS = 13 - standing side to side gentle lunges with UE On hand rail  -Standing to/from Rt tall kneeling on 3 pad x 3 reps (difficult) - hooklying fig 4 stretch with strap assist x 3 reps  - sensitive skin rock tape applied to Rt incision to assist with desensitization    05/24/24 Gait:  Pt ambulates without limping without an AD.   Upright bike x 5 mins Seat 10 L4  Pt received R hip PROM in flexion, abd, ER, and IR in supine per pt and tissue tolerance.  R hip ROM: PROM  Flexion:  91 deg AROM Abd: 22  ER:  30 IR:  45    S/L clam approx 15 Supine bridge with RTB  around thighs 2x10 Sidestepping x 2 laps at rail Bwd ambulation at rail Lateral band walks with RTB above knees x 2 laps at rail     PATIENT EDUCATION:  Education details:   HEP, exercise form/rationale, objective findings, POC.   Person educated: Patient Education method: Explanation, Demonstration, Tactile cues, Verbal cues Education comprehension: verbalized understanding, returned demonstration, verbal cues required, tactile cues required, and needs further education  HOME EXERCISE PROGRAM: Access Code: [email or web address] URL: [email or web address] Date: 03/10/2024 Prepared  by: Harlene Cordon  Exercises - Lying Prone  - 3 x daily - 7 x weekly - 2-3 min hold - Prone Gluteal Sets  - 3 x daily - 7 x weekly - 1 sets - 10 reps - 3s hold - Prone Knee Flexion  - 3 x daily - 7 x weekly - 1 sets - 10 reps - Standing Weight Shift  - 5 x daily - 7 x weekly - 1 sets - 10 reps - Supine Transversus Abdominis Bracing - Hands on Ground  - 3 x daily - 7 x weekly - 1 sets - 10 reps - 3 breaths hold - Supine Bilateral Hamstring Sets  - 3 x daily - 7 x weekly - 1 sets - 10 reps - 5s hold   ASSESSMENT:  CLINICAL IMPRESSION: Pt is 15 weeks and 1 day s/p R hip labral repair and states her R hip is doing great.  She has been having some pain in left hip though not as much recently due to not being busy at work.  She has pain with increased activity and also when her job is busy and more strenuous.  Pt had a MRI and MD note indicated L hip labral tear.  Pt states she may be having surgery on L hip.  MD ordered PT for L hip and pt states MD wants to try PT for L hip to see if it helps.  PT re-evaluated pt today evaluating both hips.  Pt has made great progress with R hip including having improved mobility and performance of ADLs/IADLs.  She continues to have pain with pivoting.  She reports she is able to ascend and descend stairs without pain or difficulty.  Pt demonstrates improved R hip ER AROM.   Pt is improving with strength in R LE.  She does have weakness in R hip abduction and 5/5 strength in L though has pain with bilat testing.  Pt had weakness in ER and pain with ER testing.  Pt has met all STG's and LTG #1.  PT updated goals.  She should benefit from cont skilled PT to address impairments and goals and to assist in restoring desired level of function.        REHAB POTENTIAL: Good  CLINICAL DECISION MAKING: Stable/uncomplicated  EVALUATION COMPLEXITY: Low   GOALS: Goals reviewed with patient? Yes  SHORT TERM GOALS: Target date: 4 weeks 8/25   Pain free flexion to 90 Baseline: Goal status: MET  05/31/24   2.  Demo controlled quad set with hold Baseline:  Goal status: MET 8/8   3.  30s sit to stand without pain or compensation Baseline: see treatment 10/21 Goal status:MET 05/31/24   4.  SLS 30s without compensation Baseline:  Goal status: MET 05/31/24  LONG TERM GOALS:  07/26/2024   Able to demo step up on 6 step with level pelvis and proper form Baseline:  Goal status: GOAL MET  06/21/2024   2.  Will tolerate at least 3 min on elliptical, demonstrating good tolerance to repetitive weight bearing motion Baseline:  Goal status: INITIAL  Week 8 9/22   3.  Demonstrate proper form in at least 10 continuous lunges without increased pain Baseline:  Goal status: INITIAL  Week 12 11/3   4.  Pt will report at least a 40% improvement in pain in L hip with work activities. Baseline:  Goal status: INITIAL    5.  Able to perform proper squat and sit<>stand to/from lower chairs that are  present at work Baseline:  Goal status: IN PROGRESS  target date: POC date   6.  Able to return to [employer] (preferred workout) with understanding of proper progressions Baseline:  Goal status: INITIAL target date: POC date  7.  Pt will be able to pivot with gait with no > 1/10 pain.    Goal status:  INITIAL  PLAN:  PT FREQUENCY: 1-2x/week  PT DURATION: 5  weeks  PLANNED INTERVENTIONS: 97164- PT Re-evaluation, 97750- Physical Performance Testing, 97110-Therapeutic exercises, 97530- Therapeutic activity, 97112- Neuromuscular re-education, 97535- Self Care, 02859- Manual therapy, [phone]- Gait training, [phone]- Aquatic Therapy, [phone] (1-2 muscles), [phone] (3+ muscles)- Dry Needling, Patient/Family education, Balance training, Stair training, Taping, Joint mobilization, Spinal mobilization, Scar mobilization, and Cryotherapy.  PLAN FOR NEXT SESSION: per hip labral repair protocol.  Will add L LE to treatment.    Leigh Minerva III PT, DPT 06/22/24 9:22 PM  [facility] [address] [place], [place], [address] Phone: [phone]   Fax:  [phone]

## 2024-06-22 ENCOUNTER — Encounter (HOSPITAL_BASED_OUTPATIENT_CLINIC_OR_DEPARTMENT_OTHER): Payer: Self-pay | Admitting: Physical Therapy

## 2024-06-30 ENCOUNTER — Other Ambulatory Visit (INDEPENDENT_AMBULATORY_CARE_PROVIDER_SITE_OTHER): Payer: Self-pay | Admitting: Family Medicine

## 2024-06-30 DIAGNOSIS — E7849 Other hyperlipidemia: Secondary | ICD-10-CM

## 2024-07-09 ENCOUNTER — Ambulatory Visit (HOSPITAL_BASED_OUTPATIENT_CLINIC_OR_DEPARTMENT_OTHER): Payer: Self-pay | Admitting: Physical Therapy

## 2024-07-13 ENCOUNTER — Ambulatory Visit (HOSPITAL_BASED_OUTPATIENT_CLINIC_OR_DEPARTMENT_OTHER): Payer: Self-pay | Attending: Orthopaedic Surgery | Admitting: Physical Therapy

## 2024-07-13 ENCOUNTER — Ambulatory Visit (INDEPENDENT_AMBULATORY_CARE_PROVIDER_SITE_OTHER): Payer: Self-pay | Admitting: Family Medicine

## 2024-07-20 ENCOUNTER — Encounter (HOSPITAL_BASED_OUTPATIENT_CLINIC_OR_DEPARTMENT_OTHER): Payer: Self-pay | Admitting: Physical Therapy

## 2024-07-20 ENCOUNTER — Ambulatory Visit (HOSPITAL_BASED_OUTPATIENT_CLINIC_OR_DEPARTMENT_OTHER): Payer: Self-pay | Attending: Orthopaedic Surgery | Admitting: Physical Therapy

## 2024-07-20 DIAGNOSIS — M6281 Muscle weakness (generalized): Secondary | ICD-10-CM | POA: Insufficient documentation

## 2024-07-20 DIAGNOSIS — M25551 Pain in right hip: Secondary | ICD-10-CM

## 2024-07-20 DIAGNOSIS — M25552 Pain in left hip: Secondary | ICD-10-CM | POA: Insufficient documentation

## 2024-07-20 DIAGNOSIS — R262 Difficulty in walking, not elsewhere classified: Secondary | ICD-10-CM

## 2024-07-20 NOTE — Therapy (Signed)
 OUTPATIENT PHYSICAL THERAPY TREATMENT  Patient Name: Betty Porter MRN: [id number] DOB:July 10, 1971, 53 y.o., female Today's Date: 07/20/2024  END OF SESSION:  PT End of Session - 07/20/24 0804     Visit Number 13    Number of Visits 22    Date for Recertification  07/26/24    Authorization Type AETNA    PT Start Time 0802    PT Stop Time 0840    PT Time Calculation (min) 38 min    Behavior During Therapy [facility] for tasks assessed/performed            Past Medical History:  Diagnosis Date   Acne    Adnexal mass    complex   Allergy 2010   Seasonal   Fibroid, uterine 2008   GERD (gastroesophageal reflux disease)    takes OTC meds   Hand swelling    Hyperlipidemia    Hypothyroidism    followed by pcp   Joint pain    Lactose intolerance    Lower extremity edema    PONV (postoperative nausea and vomiting)    Pre-diabetes    SUI (stress urinary incontinence, female)    Past Surgical History:  Procedure Laterality Date   CESAREAN SECTION  2009   LAPAROSCOPY  2008   for infertility   TONSILLECTOMY  1977   XI ROBOTIC ASSISTED OOPHORECTOMY N/A 09/12/2020   Procedure: XI ROBOTIC ASSISTED BILATERAL SALPINGECTOMY AND LEFT OOPHORECTOMY;  Surgeon: Viktoria Comer SAUNDERS, MD;  Location: [facility] [place];  Service: Gynecology;  Laterality: N/A;   Patient Active Problem List   Diagnosis Date Noted   Tear of right acetabular labrum 03/07/2024   Chronic back pain 12/09/2023   SOB (shortness of breath) on exertion 12/09/2023   Chronic right hip pain 06/22/2023   Perimenopause 04/01/2023   BMI 31.0-31.9,adult 09/18/2022   Insulin  resistance 06/18/2022   Generalized obesity 04/22/2022   Hypothyroidism 03/25/2022   Other hyperlipidemia 03/25/2022   Depression 03/25/2022   Mixed hyperlipidemia 02/25/2022   Acquired hypothyroidism 02/25/2022   Pre-diabetes 02/25/2022   Vitamin D  deficiency 12/18/2020   At risk for deficient intake of food 12/18/2020   Elevated  CA-125    Cyst of left ovary 08/31/2020   TMJ pain dysfunction syndrome 03/31/2017     REFERRING PROVIDER:  Genelle Standing, MD    REFERRING DIAG:  [phone] (ICD-10-CM) - Tear of right acetabular labrum, initial encounter    s/p Rt hip labral repair  D26.807J (ICD-10-CM) - Tear of left acetabular labrum, initial encounter     Rationale for Evaluation and Treatment: Rehabilitation  THERAPY DIAG:  Pain in right hip  Pain in left hip  Muscle weakness (generalized)  Difficulty in walking, not elsewhere classified  ONSET DATE: DOS 03/07/24   SUBJECTIVE:  SUBJECTIVE STATEMENT: I'm frustrated.  I'm not planning to have surgery on Lt hip since my right is still bothering me.  Pt reports she continues to have lower back pain and Rt hip pain when coming to standing position after sitting at work.  Pt reports increased hip pain when she is busier at work.    PERTINENT HISTORY:  none  PAIN:  Are you in pain?  Yes NPRS: 1-3/10 Location: lower back, Rt hip, Lt medial knee Description: ache, tight, tender   PRECAUTIONS:  None  RED FLAGS: None   WEIGHT BEARING RESTRICTIONS:  No  FALLS:  Has patient fallen in last 6 months? 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  L hip MRI 10/18: IMPRESSION: Mild to moderate insertional tendinopathy of the gluteus minimus bilaterally   Mild tendinopathy and mild chronic appearing partial tears to the proximal hamstrings greater on the left. Correlate for point  tenderness.  PATIENT SURVEYS:  LEFS  Extreme difficulty/unable (0), Quite a bit of difficulty (1), Moderate difficulty (2), Little difficulty (3), No difficulty (4) Survey date:  eval 05/31/24  06/21/24  Any of your usual work, housework or school activities 1 3 4   2. Usual hobbies, recreational or sporting activities 0 3 3  3. Getting into/out of the bath 1 3 3   4. Walking between rooms 3 4 4   5. Putting on socks/shoes 1 2 3   6. Squatting  1  3  7. Lifting an object, like a bag of groceries from the floor 2 2 3   8. Performing light activities around your home 2 2 4   9. Performing heavy activities around your home 0 2 3  10. Getting into/out of a car 2 3 4   11. Walking 2 blocks 1 3 4   12. Walking 1 mile 0 2 3  13. Going up/down 10 stairs (1 flight) 0 4 4  14. Standing for 1 hour 0 3 3  15.  sitting for 1 hour 1 2 2   16. Running on even ground 0 3 0  17. Running on uneven ground 0 2 0  18. Making sharp turns while running fast 0 2 0  19. Hopping  0 2 4  20. Rolling over in bed 2 3 3   Score total:  17/80 55/80 57/80      COGNITIVE STATUS: Within functional limits for tasks assessed   SENSATION: WFL  GAIT: EVAL: uses AD for long distances but not otherwise; short step length, mild antalgia on Rt.    Body Part #1 Hip  PALPATION: EVAL: soft end feel at 90 deg hip flexion, no s/s of infection at incision site- most lateral incision leaking upon changing bandages  EVAL: good quad and glut set, able to demo controlled prone hamstring curl  TREATMENT DATE:  07/20/24 Self care: pt instructed in self massage with roller stick to Lt adductors. Pt returned demo with cues.  Therapeutic exercise:   -Seated hip flexor stretch x20 sec x 2 each LE -Seated fig 4 (limited tolerance) x 15 sec each -Supine 3 way LE stretch with strap (ITB, hamstring, adductor) each  position held 20sec x 2 reps each -LTR with arms in T x 3 reps each side -Windshield wipers (see HEP) x 3 each side  -Single knee to chest (limited tolerance on RLE, ant hip discomfort) - piriformis stretch with strap assist     06/21/24 Reviewed current function and pain levels.  Strength:   5/5 bilat knee extension Hip abduction:  R: 4/5 in, L: 5/5 with pain bilat Hip flexion:  L:  5/5 with pain ER:  R:  tolerated resistance though had pain, L:  tolerated slight resistance with pain  R/L hip AROM: Hip flexion:  L: 116 deg Hip abduction:  R/L: 30/25 deg ER:  R:  39 deg, L:  41 deg  Lateral band walks with RTB above knees x 3 laps at rail Step ups 8 inch step 2x10   05/31/24 -LEFS -Self care: pt instructed in self massage with roller stick to Rt ant thigh and scar mobilization/ desensitization - pt returned demo with cues -Treadmill: up to 2. x 5 min for dynamic warm up - Standing quad stretch x 20 sec R x 2 reps  -30 sec SLS on RLE  - 30 STS = 13 - standing side to side gentle lunges with UE On hand rail  -Standing to/from Rt tall kneeling on 3 pad x 3 reps (difficult) - hooklying fig 4 stretch with strap assist x 3 reps  - sensitive skin rock tape applied to Rt incision to assist with desensitization    05/24/24 Gait:  Pt ambulates without limping without an AD.   Upright bike x 5 mins Seat 10 L4  Pt received R hip PROM in flexion, abd, ER, and IR in supine per pt and tissue tolerance.  R hip ROM: PROM  Flexion:  91 deg AROM Abd: 22  ER:  30 IR:  45    S/L clam approx 15 Supine bridge with RTB around thighs 2x10 Sidestepping x 2 laps at rail Bwd ambulation at rail Lateral band walks with RTB above knees x 2 laps at rail     PATIENT EDUCATION:  Education details:   HEP, exercise form/rationale, objective findings, POC.   Person educated: Patient Education method: Explanation, Demonstration, Tactile cues, Verbal cues Education comprehension:  verbalized understanding, returned demonstration, verbal cues required, tactile cues required, and needs further education  HOME EXERCISE PROGRAM: Access Code: [email or web address] URL: [email or web address] Date: 03/10/2024 Prepared by: Harlene Cordon  Exercises - Lying Prone  - 3 x daily - 7 x weekly - 2-3 min hold - Prone Gluteal Sets  - 3 x daily - 7 x weekly - 1 sets - 10 reps - 3s hold - Prone Knee Flexion  - 3 x daily - 7 x weekly - 1 sets - 10 reps - Standing Weight Shift  - 5 x daily - 7 x weekly - 1 sets - 10 reps - Supine Transversus Abdominis Bracing - Hands on Ground  - 3 x daily - 7 x weekly - 1 sets - 10 reps - 3 breaths hold - Supine Bilateral Hamstring Sets  - 3 x daily - 7 x weekly - 1 sets - 10 reps - 5s  hold  Access Code: [email or web address] (just stretches URL: [email or web address] Date: 07/20/2024   ASSESSMENT:  CLINICAL IMPRESSION: Pt is 19 weeks s/p R hip labral repair.  Pt tolerated most stretches well.  Encouraged pt to complete stretches daily.  She reported reduction of tightness and pain by end of session.Pt plans to look for yoga/mobility class to complete additional exercises until next scheduled appointment.  PT to reassess goals next session, as pt was only able to complete one visit since recert.  She should benefit from cont skilled PT to address impairments and goals and to assist in restoring desired level of function.        REHAB POTENTIAL: Good  CLINICAL DECISION MAKING: Stable/uncomplicated  EVALUATION COMPLEXITY: Low   GOALS: Goals reviewed with patient? Yes  SHORT TERM GOALS: Target date: 4 weeks 8/25   Pain free flexion to 90 Baseline: Goal status: MET  05/31/24   2.  Demo controlled quad set with hold Baseline:  Goal status: MET 8/8   3.  30s sit to stand without pain or compensation Baseline: see treatment 10/21 Goal status:MET 05/31/24   4.  SLS 30s without compensation Baseline:  Goal status: MET  05/31/24  LONG TERM GOALS:  07/26/2024   Able to demo step up on 6 step with level pelvis and proper form Baseline:  Goal status: GOAL MET  06/21/2024   2.  Will tolerate at least 3 min on elliptical, demonstrating good tolerance to repetitive weight bearing motion Baseline:  Goal status: INITIAL  Week 8 9/22   3.  Demonstrate proper form in at least 10 continuous lunges without increased pain Baseline:  Goal status: INITIAL  Week 12 11/3   4.  Pt will report at least a 40% improvement in pain in L hip with work activities. Baseline:  Goal status: INITIAL    5.  Able to perform proper squat and sit<>stand to/from lower chairs that are present at work Baseline:  Goal status: IN PROGRESS -07/20/24   target date: POC date   6.  Able to return to [employer] (preferred workout) with understanding of proper progressions Baseline:  Goal status: INITIAL target date: POC date  7.  Pt will be able to pivot with gait with no > 1/10 pain.    Goal status:  INITIAL  PLAN:  PT FREQUENCY: 1-2x/week  PT DURATION: 5 weeks  PLANNED INTERVENTIONS: 97164- PT Re-evaluation, 97750- Physical Performance Testing, 97110-Therapeutic exercises, 97530- Therapeutic activity, 97112- Neuromuscular re-education, 97535- Self Care, 02859- Manual therapy, [phone]- Gait training, [phone]- Aquatic Therapy, [phone] (1-2 muscles), 20561 (3+ muscles)- Dry Needling, Patient/Family education, Balance training, Stair training, Taping, Joint mobilization, Spinal mobilization, Scar mobilization, and Cryotherapy.  PLAN FOR NEXT SESSION: reassess.  add L LE to treatment.   Delon Aquas, PTA 07/20/24 10:24 AM [facility] [address] [place], [place], [address] Phone: [phone]   Fax:  [phone]

## 2024-07-23 ENCOUNTER — Other Ambulatory Visit (INDEPENDENT_AMBULATORY_CARE_PROVIDER_SITE_OTHER): Payer: Self-pay | Admitting: Family Medicine

## 2024-07-23 DIAGNOSIS — E559 Vitamin D deficiency, unspecified: Secondary | ICD-10-CM

## 2024-08-22 ENCOUNTER — Encounter (INDEPENDENT_AMBULATORY_CARE_PROVIDER_SITE_OTHER): Payer: Self-pay | Admitting: Family Medicine

## 2024-08-22 ENCOUNTER — Ambulatory Visit (INDEPENDENT_AMBULATORY_CARE_PROVIDER_SITE_OTHER): Admitting: Family Medicine

## 2024-08-22 VITALS — BP 110/73 | HR 80 | Temp 97.9°F | Ht 61.0 in | Wt 173.0 lb

## 2024-08-22 DIAGNOSIS — Z6832 Body mass index (BMI) 32.0-32.9, adult: Secondary | ICD-10-CM

## 2024-08-22 DIAGNOSIS — E038 Other specified hypothyroidism: Secondary | ICD-10-CM

## 2024-08-22 DIAGNOSIS — E669 Obesity, unspecified: Secondary | ICD-10-CM | POA: Diagnosis not present

## 2024-08-22 DIAGNOSIS — E559 Vitamin D deficiency, unspecified: Secondary | ICD-10-CM

## 2024-08-22 DIAGNOSIS — R7303 Prediabetes: Secondary | ICD-10-CM

## 2024-08-22 DIAGNOSIS — E7849 Other hyperlipidemia: Secondary | ICD-10-CM | POA: Diagnosis not present

## 2024-08-22 DIAGNOSIS — F3289 Other specified depressive episodes: Secondary | ICD-10-CM | POA: Diagnosis not present

## 2024-08-22 MED ORDER — LEVOTHYROXINE SODIUM 75 MCG PO TABS: 75.0000 ug | ORAL_TABLET | Freq: Every day | ORAL | 0 refills | Status: AC

## 2024-08-22 MED ORDER — VITAMIN D (ERGOCALCIFEROL) 1.25 MG (50000 UNIT) PO CAPS: ORAL_CAPSULE | ORAL | 0 refills | Status: AC

## 2024-08-22 MED ORDER — ATORVASTATIN CALCIUM 10 MG PO TABS: 10.0000 mg | ORAL_TABLET | Freq: Every day | ORAL | 0 refills | Status: AC

## 2024-08-22 MED ORDER — METFORMIN HCL 500 MG PO TABS: 500.0000 mg | ORAL_TABLET | Freq: Two times a day (BID) | ORAL | 0 refills | Status: AC

## 2024-08-22 MED ORDER — GEMFIBROZIL 600 MG PO TABS: 600.0000 mg | ORAL_TABLET | Freq: Two times a day (BID) | ORAL | 0 refills | Status: AC

## 2024-08-22 NOTE — Progress Notes (Signed)
 "  Office: [phone]  /  Fax: [phone]  WEIGHT SUMMARY AND BIOMETRICS  Anthropometric Measurements Height: 5' 1 (1.549 m) Weight: 173 lb (78.5 kg) BMI (Calculated): 32.7 Weight at Last Visit: 169 lb Weight Lost Since Last Visit: 0 Weight Gained Since Last Visit: 4 lb Starting Weight: 178 lb Total Weight Loss (lbs): 5 lb (2.268 kg) Peak Weight: 178 lb   Body Composition  Body Fat %: 37.9 % Fat Mass (lbs): 65.8 lbs Muscle Mass (lbs): 102.2 lbs Total Body Water (lbs): 73.8 lbs Visceral Fat Rating : 9   Other Clinical Data Fasting: no Labs: no Today's Visit #: 40 Starting Date: 11/06/20    Chief Complaint: OBESITY    History of Present Illness Betty Porter is a 54 year old female with obesity who presents for a follow-up on her treatment plan and progress.  She has been adhering to the category three eating plan approximately 20% of the time over the past two months, influenced by the holiday season, including Thanksgiving, Christmas, and New Year's. She has not engaged in regular exercise recently but is attempting to resume her health regimen. She gained four pounds during the holidays but believes she has lost one to two pounds since resuming her routine after the school break.  She is currently managing hyperlipidemia with diet, exercise, weight loss, and Lipitor 10 mg. She requests a refill for this medication. Additionally, she takes gemfibrozil  600 mg twice daily for hypolipidemia but has been without it for about ten days due to misplacing her supply and requests a refill.  She is managing prediabetes with diet, exercise, weight loss, and metformin  500 mg twice daily. She requests a refill for metformin . Her last A1c was 5.5 in April of the previous year.  She is being treated for vitamin D  deficiency with ergocalciferol  50,000 IU per week and requests a refill. Her vitamin D  level was 47.1 in April of the previous year.  For emotional eating  behaviors, she takes bupropion  SR 150 mg twice daily and requests a refill. She is also on levothyroxine  75 mcg daily for hypothyroidism and requests a refill.  Her last cholesterol check showed an LDL of 127 in April of the previous year.      PHYSICAL EXAM:  Blood pressure 110/73, pulse 80, temperature 97.9 F (36.6 C), height 5' 1 (1.549 m), weight 173 lb (78.5 kg), SpO2 97%. Body mass index is 32.69 kg/m.  DIAGNOSTIC DATA REVIEWED:  BMET    Component Value Date/Time   NA 135 03/03/2024 1400   NA 139 12/09/2023 0919   K 4.0 03/03/2024 1400   CL 104 03/03/2024 1400   CO2 24 03/03/2024 1400   GLUCOSE 95 03/03/2024 1400   BUN 20 03/03/2024 1400   BUN 13 12/09/2023 0919   CREATININE 1.13 (H) 03/03/2024 1400   CALCIUM  9.9 03/03/2024 1400   GFRNONAA 59 (L) 03/03/2024 1400   Lab Results  Component Value Date   HGBA1C 5.5 12/09/2023   HGBA1C 5.7 (H) 11/06/2020   Lab Results  Component Value Date   INSULIN  15.2 12/09/2023   INSULIN  11.9 11/06/2020   Lab Results  Component Value Date   TSH 1.960 12/09/2023   CBC    Component Value Date/Time   WBC 6.1 11/06/2020 1216   WBC 5.7 09/10/2020 0917   RBC 4.46 11/06/2020 1216   RBC 4.50 09/10/2020 0917   HGB 13.0 11/06/2020 1216   HCT 38.8 11/06/2020 1216   PLT 299 11/06/2020 1216  MCV 87 11/06/2020 1216   MCH 29.1 11/06/2020 1216   MCH 29.3 09/10/2020 0917   MCHC 33.5 11/06/2020 1216   MCHC 33.1 09/10/2020 0917   RDW 13.1 11/06/2020 1216   Iron Studies No results found for: IRON, TIBC, FERRITIN, IRONPCTSAT Lipid Panel     Component Value Date/Time   CHOL 210 (H) 12/09/2023 0919   TRIG 125 12/09/2023 0919   HDL 61 12/09/2023 0919   LDLCALC 127 (H) 12/09/2023 0919   Hepatic Function Panel     Component Value Date/Time   PROT 6.9 12/09/2023 0919   ALBUMIN 4.4 12/09/2023 0919   AST 24 12/09/2023 0919   ALT 26 12/09/2023 0919   ALKPHOS 81 12/09/2023 0919   BILITOT 0.3 12/09/2023 0919       Component Value Date/Time   TSH 1.960 12/09/2023 0919   Nutritional Lab Results  Component Value Date   VD25OH 47.1 12/09/2023   VD25OH 40.6 07/08/2023   VD25OH 37.4 11/19/2022     Assessment and Plan Assessment & Plan Obesity Management is ongoing with a focus on dietary adherence and exercise. She has been following the category three eating plan only 20% of the time due to recent holidays. She has not been exercising recently but is working on resuming her routine. A weight gain of 4 pounds over the holidays is noted, which is less than the average [place] gain of 13 pounds during the same period. She has lost 1-2 pounds since returning to school, indicating a positive trend. - Provided handout with meal prep recipes and grocery list to aid in dietary adherence. - Encouraged resumption of exercise routine.  Other hyperlipidemia Hyperlipidemia is managed with diet, exercise, weight loss, and medication. She is on Lipitor 10 mg and gemfibrozil  600 mg twice a day. There was a discrepancy in the gemfibrozil  prescription, which was written for once daily instead of twice daily, leading to a 10-day lapse in medication. - Corrected gemfibrozil  prescription to 600 mg twice daily for 90 days. - Refilled Lipitor 10 mg. - Continue diet, exercise and weight loss as discussed today as an important part of the treatment plan - Check labs next visit   Prediabetes Managed with diet, exercise, weight loss, and metformin  500 mg twice a day. Her A1c was 5.5% in April of last year, indicating good control. - Refilled metformin  500 mg twice daily. - Continue diet, exercise and weight loss as discussed today as an important part of the treatment plan - Check labs next visit  Vitamin D  deficiency Managed with ergocalciferol  50,000 IU weekly. Her vitamin D  level was 47.1 in April of last year. She is due for lab rechecks to ensure levels remain within the therapeutic range. - Refilled ergocalciferol   50,000 IU weekly. - Check labs next visit  Other specified hypothyroidism Hypothyroidism is managed with levothyroxine  75 mcg daily. - Refilled levothyroxine  75 mcg daily. - Check labs next visit  Other depression Depression is managed with bupropion  SR 150 mg twice a day. - Continue bupropion  SR 150 mg twice daily.      Patients who are on anti-obesity medications are counseled on the importance of maintaining healthy lifestyle habits, including balanced nutrition, regular physical activity, and behavioral modifications,  Medication is an adjunct to, not a replacement for, lifestyle changes and that the long-term success and weight maintenance depend on continued adherence to these strategies.   Ema was informed of the importance of frequent follow up visits to maximize her success with intensive lifestyle modifications  for her obesity and obesity related health conditions as recommended by USPSTF and CMS guidelines  [name], MD   "

## 2024-09-07 ENCOUNTER — Ambulatory Visit (HOSPITAL_BASED_OUTPATIENT_CLINIC_OR_DEPARTMENT_OTHER): Payer: Self-pay | Admitting: Physical Therapy

## 2024-09-13 ENCOUNTER — Ambulatory Visit (HOSPITAL_BASED_OUTPATIENT_CLINIC_OR_DEPARTMENT_OTHER): Payer: Self-pay | Attending: Orthopaedic Surgery | Admitting: Physical Therapy

## 2024-09-15 ENCOUNTER — Other Ambulatory Visit (INDEPENDENT_AMBULATORY_CARE_PROVIDER_SITE_OTHER): Payer: Self-pay | Admitting: Family Medicine

## 2024-09-15 DIAGNOSIS — R7303 Prediabetes: Secondary | ICD-10-CM

## 2024-09-15 DIAGNOSIS — E559 Vitamin D deficiency, unspecified: Secondary | ICD-10-CM

## 2024-09-15 DIAGNOSIS — E782 Mixed hyperlipidemia: Secondary | ICD-10-CM

## 2024-09-16 LAB — CMP14+EGFR
ALT: 23 [IU]/L (ref 0–32)
AST: 18 [IU]/L (ref 0–40)
Albumin: 4.6 g/dL (ref 3.8–4.9)
Alkaline Phosphatase: 102 [IU]/L (ref 49–135)
BUN/Creatinine Ratio: 19 (ref 9–23)
BUN: 19 mg/dL (ref 6–24)
Bilirubin Total: 0.2 mg/dL (ref 0.0–1.2)
CO2: 21 mmol/L (ref 20–29)
Calcium: 9.7 mg/dL (ref 8.7–10.2)
Chloride: 104 mmol/L (ref 96–106)
Creatinine, Ser: 1.01 mg/dL — ABNORMAL HIGH (ref 0.57–1.00)
Globulin, Total: 2.8 g/dL (ref 1.5–4.5)
Glucose: 92 mg/dL (ref 70–99)
Potassium: 4.7 mmol/L (ref 3.5–5.2)
Sodium: 142 mmol/L (ref 134–144)
Total Protein: 7.4 g/dL (ref 6.0–8.5)
eGFR: 67 mL/min/{1.73_m2}

## 2024-09-16 LAB — LIPID PANEL WITH LDL/HDL RATIO
Cholesterol, Total: 205 mg/dL — ABNORMAL HIGH (ref 100–199)
HDL: 64 mg/dL
LDL Chol Calc (NIH): 112 mg/dL — ABNORMAL HIGH (ref 0–99)
LDL/HDL Ratio: 1.8 ratio (ref 0.0–3.2)
Triglycerides: 165 mg/dL — ABNORMAL HIGH (ref 0–149)
VLDL Cholesterol Cal: 29 mg/dL (ref 5–40)

## 2024-09-16 LAB — HEMOGLOBIN A1C
Est. average glucose Bld gHb Est-mCnc: 117 mg/dL
Hgb A1c MFr Bld: 5.7 % — ABNORMAL HIGH (ref 4.8–5.6)

## 2024-09-16 LAB — INSULIN, RANDOM: INSULIN: 21.5 u[IU]/mL (ref 2.6–24.9)

## 2024-09-16 LAB — VITAMIN B12: Vitamin B-12: 608 pg/mL (ref 232–1245)

## 2024-09-16 LAB — VITAMIN D 25 HYDROXY (VIT D DEFICIENCY, FRACTURES): Vit D, 25-Hydroxy: 56.5 ng/mL (ref 30.0–100.0)

## 2024-09-16 LAB — TSH: TSH: 2.85 u[IU]/mL (ref 0.450–4.500)

## 2024-09-20 ENCOUNTER — Ambulatory Visit (HOSPITAL_BASED_OUTPATIENT_CLINIC_OR_DEPARTMENT_OTHER): Attending: Orthopaedic Surgery | Admitting: Physical Therapy

## 2024-09-22 ENCOUNTER — Ambulatory Visit (INDEPENDENT_AMBULATORY_CARE_PROVIDER_SITE_OTHER): Admitting: Family Medicine

## 2024-09-27 ENCOUNTER — Encounter (HOSPITAL_BASED_OUTPATIENT_CLINIC_OR_DEPARTMENT_OTHER): Admitting: Physical Therapy

## 2024-10-04 ENCOUNTER — Encounter (HOSPITAL_BASED_OUTPATIENT_CLINIC_OR_DEPARTMENT_OTHER): Admitting: Physical Therapy
